# Patient Record
Sex: Female | Born: 1980 | Race: Black or African American | Hispanic: No | Marital: Married | Smoking: Never smoker
Health system: Southern US, Community
[De-identification: ages and names within clinical notes are randomized; demographics above are authoritative.]

## PROBLEM LIST (undated history)

## (undated) DIAGNOSIS — R079 Chest pain, unspecified: Secondary | ICD-10-CM

## (undated) DIAGNOSIS — R7303 Prediabetes: Secondary | ICD-10-CM

## (undated) DIAGNOSIS — F419 Anxiety disorder, unspecified: Secondary | ICD-10-CM

## (undated) DIAGNOSIS — I10 Essential (primary) hypertension: Secondary | ICD-10-CM

## (undated) DIAGNOSIS — F329 Major depressive disorder, single episode, unspecified: Secondary | ICD-10-CM

## (undated) DIAGNOSIS — D649 Anemia, unspecified: Secondary | ICD-10-CM

## (undated) DIAGNOSIS — K219 Gastro-esophageal reflux disease without esophagitis: Secondary | ICD-10-CM

## (undated) DIAGNOSIS — T7840XA Allergy, unspecified, initial encounter: Secondary | ICD-10-CM

## (undated) DIAGNOSIS — G44009 Cluster headache syndrome, unspecified, not intractable: Secondary | ICD-10-CM

## (undated) DIAGNOSIS — E785 Hyperlipidemia, unspecified: Secondary | ICD-10-CM

## (undated) DIAGNOSIS — M549 Dorsalgia, unspecified: Secondary | ICD-10-CM

## (undated) DIAGNOSIS — E282 Polycystic ovarian syndrome: Secondary | ICD-10-CM

## (undated) DIAGNOSIS — F32A Depression, unspecified: Secondary | ICD-10-CM

## (undated) DIAGNOSIS — G4733 Obstructive sleep apnea (adult) (pediatric): Secondary | ICD-10-CM

## (undated) HISTORY — DX: Major depressive disorder, single episode, unspecified: F32.9

## (undated) HISTORY — DX: Allergy, unspecified, initial encounter: T78.40XA

## (undated) HISTORY — PX: WISDOM TOOTH EXTRACTION: SHX21

## (undated) HISTORY — DX: Chest pain, unspecified: R07.9

## (undated) HISTORY — DX: Morbid (severe) obesity due to excess calories: E66.01

## (undated) HISTORY — DX: Essential (primary) hypertension: I10

## (undated) HISTORY — DX: Cluster headache syndrome, unspecified, not intractable: G44.009

## (undated) HISTORY — DX: Hyperlipidemia, unspecified: E78.5

## (undated) HISTORY — DX: Depression, unspecified: F32.A

## (undated) HISTORY — PX: UPPER GI ENDOSCOPY: SHX6162

## (undated) HISTORY — PX: COLONOSCOPY: SHX174

## (undated) HISTORY — DX: Obstructive sleep apnea (adult) (pediatric): G47.33

---
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
## 1998-05-04 ENCOUNTER — Emergency Department (HOSPITAL_COMMUNITY): Admission: EM | Admit: 1998-05-04 | Discharge: 1998-05-04 | Payer: Self-pay | Admitting: Emergency Medicine

## 1998-06-09 ENCOUNTER — Emergency Department (HOSPITAL_COMMUNITY): Admission: EM | Admit: 1998-06-09 | Discharge: 1998-06-09 | Payer: Self-pay | Admitting: Emergency Medicine

## 1998-06-09 ENCOUNTER — Encounter: Payer: Self-pay | Admitting: Emergency Medicine

## 1998-06-10 ENCOUNTER — Emergency Department (HOSPITAL_COMMUNITY): Admission: EM | Admit: 1998-06-10 | Discharge: 1998-06-10 | Payer: Self-pay | Admitting: Emergency Medicine

## 1998-06-10 ENCOUNTER — Encounter: Payer: Self-pay | Admitting: Emergency Medicine

## 1998-09-20 ENCOUNTER — Emergency Department (HOSPITAL_COMMUNITY): Admission: EM | Admit: 1998-09-20 | Discharge: 1998-09-20 | Payer: Self-pay | Admitting: Internal Medicine

## 1998-10-23 ENCOUNTER — Emergency Department (HOSPITAL_COMMUNITY): Admission: EM | Admit: 1998-10-23 | Discharge: 1998-10-23 | Payer: Self-pay | Admitting: Emergency Medicine

## 1999-02-26 ENCOUNTER — Other Ambulatory Visit: Admission: RE | Admit: 1999-02-26 | Discharge: 1999-02-26 | Payer: Self-pay | Admitting: Obstetrics

## 1999-03-06 ENCOUNTER — Encounter: Admission: RE | Admit: 1999-03-06 | Discharge: 1999-03-28 | Payer: Self-pay | Admitting: Family Medicine

## 2000-02-11 ENCOUNTER — Emergency Department (HOSPITAL_COMMUNITY): Admission: EM | Admit: 2000-02-11 | Discharge: 2000-02-11 | Payer: Self-pay | Admitting: Emergency Medicine

## 2000-02-27 ENCOUNTER — Other Ambulatory Visit: Admission: RE | Admit: 2000-02-27 | Discharge: 2000-02-27 | Payer: Self-pay | Admitting: Obstetrics

## 2000-03-28 ENCOUNTER — Emergency Department (HOSPITAL_COMMUNITY): Admission: EM | Admit: 2000-03-28 | Discharge: 2000-03-28 | Payer: Self-pay | Admitting: Emergency Medicine

## 2000-10-01 ENCOUNTER — Emergency Department (HOSPITAL_COMMUNITY): Admission: EM | Admit: 2000-10-01 | Discharge: 2000-10-02 | Payer: Self-pay | Admitting: Emergency Medicine

## 2000-10-07 ENCOUNTER — Encounter: Payer: Self-pay | Admitting: Emergency Medicine

## 2000-10-07 ENCOUNTER — Emergency Department (HOSPITAL_COMMUNITY): Admission: EM | Admit: 2000-10-07 | Discharge: 2000-10-08 | Payer: Self-pay | Admitting: Emergency Medicine

## 2001-02-09 ENCOUNTER — Inpatient Hospital Stay (HOSPITAL_COMMUNITY): Admission: AD | Admit: 2001-02-09 | Discharge: 2001-02-09 | Payer: Self-pay | Admitting: Obstetrics & Gynecology

## 2001-03-28 ENCOUNTER — Emergency Department (HOSPITAL_COMMUNITY): Admission: EM | Admit: 2001-03-28 | Discharge: 2001-03-28 | Payer: Self-pay | Admitting: Emergency Medicine

## 2001-03-30 ENCOUNTER — Emergency Department (HOSPITAL_COMMUNITY): Admission: EM | Admit: 2001-03-30 | Discharge: 2001-03-30 | Payer: Self-pay | Admitting: Emergency Medicine

## 2001-05-24 ENCOUNTER — Inpatient Hospital Stay (HOSPITAL_COMMUNITY): Admission: AD | Admit: 2001-05-24 | Discharge: 2001-05-24 | Payer: Self-pay | Admitting: Obstetrics

## 2001-09-27 ENCOUNTER — Emergency Department (HOSPITAL_COMMUNITY): Admission: EM | Admit: 2001-09-27 | Discharge: 2001-09-27 | Payer: Self-pay | Admitting: Emergency Medicine

## 2001-10-18 ENCOUNTER — Emergency Department (HOSPITAL_COMMUNITY): Admission: EM | Admit: 2001-10-18 | Discharge: 2001-10-18 | Payer: Self-pay | Admitting: Emergency Medicine

## 2001-10-23 ENCOUNTER — Encounter: Admission: RE | Admit: 2001-10-23 | Discharge: 2001-10-23 | Payer: Self-pay | Admitting: Internal Medicine

## 2001-11-24 ENCOUNTER — Emergency Department (HOSPITAL_COMMUNITY): Admission: EM | Admit: 2001-11-24 | Discharge: 2001-11-25 | Payer: Self-pay | Admitting: Emergency Medicine

## 2001-11-25 ENCOUNTER — Encounter: Payer: Self-pay | Admitting: Emergency Medicine

## 2002-02-04 HISTORY — PX: APPENDECTOMY: SHX54

## 2002-05-24 ENCOUNTER — Emergency Department (HOSPITAL_COMMUNITY): Admission: EM | Admit: 2002-05-24 | Discharge: 2002-05-24 | Payer: Self-pay | Admitting: Emergency Medicine

## 2002-10-16 ENCOUNTER — Emergency Department (HOSPITAL_COMMUNITY): Admission: EM | Admit: 2002-10-16 | Discharge: 2002-10-16 | Payer: Self-pay | Admitting: Emergency Medicine

## 2002-10-16 ENCOUNTER — Encounter: Payer: Self-pay | Admitting: Emergency Medicine

## 2002-12-08 ENCOUNTER — Emergency Department (HOSPITAL_COMMUNITY): Admission: EM | Admit: 2002-12-08 | Discharge: 2002-12-08 | Payer: Self-pay | Admitting: Emergency Medicine

## 2002-12-09 ENCOUNTER — Observation Stay (HOSPITAL_COMMUNITY): Admission: EM | Admit: 2002-12-09 | Discharge: 2002-12-10 | Payer: Self-pay | Admitting: Emergency Medicine

## 2002-12-10 ENCOUNTER — Encounter (INDEPENDENT_AMBULATORY_CARE_PROVIDER_SITE_OTHER): Payer: Self-pay | Admitting: Specialist

## 2003-01-20 ENCOUNTER — Emergency Department (HOSPITAL_COMMUNITY): Admission: EM | Admit: 2003-01-20 | Discharge: 2003-01-20 | Payer: Self-pay | Admitting: Emergency Medicine

## 2003-03-29 ENCOUNTER — Other Ambulatory Visit: Admission: RE | Admit: 2003-03-29 | Discharge: 2003-03-29 | Payer: Self-pay | Admitting: *Deleted

## 2003-04-26 ENCOUNTER — Emergency Department (HOSPITAL_COMMUNITY): Admission: EM | Admit: 2003-04-26 | Discharge: 2003-04-26 | Payer: Self-pay | Admitting: Emergency Medicine

## 2003-07-23 ENCOUNTER — Emergency Department (HOSPITAL_COMMUNITY): Admission: EM | Admit: 2003-07-23 | Discharge: 2003-07-23 | Payer: Self-pay | Admitting: Emergency Medicine

## 2003-12-17 ENCOUNTER — Emergency Department (HOSPITAL_COMMUNITY): Admission: EM | Admit: 2003-12-17 | Discharge: 2003-12-17 | Payer: Self-pay | Admitting: Emergency Medicine

## 2004-03-13 ENCOUNTER — Emergency Department (HOSPITAL_COMMUNITY): Admission: EM | Admit: 2004-03-13 | Discharge: 2004-03-13 | Payer: Self-pay | Admitting: *Deleted

## 2004-06-20 ENCOUNTER — Emergency Department (HOSPITAL_COMMUNITY): Admission: EM | Admit: 2004-06-20 | Discharge: 2004-06-20 | Payer: Self-pay | Admitting: Emergency Medicine

## 2004-06-21 ENCOUNTER — Ambulatory Visit (HOSPITAL_COMMUNITY): Admission: RE | Admit: 2004-06-21 | Discharge: 2004-06-21 | Payer: Self-pay | Admitting: Emergency Medicine

## 2004-08-02 ENCOUNTER — Emergency Department (HOSPITAL_COMMUNITY): Admission: EM | Admit: 2004-08-02 | Discharge: 2004-08-02 | Payer: Self-pay | Admitting: Emergency Medicine

## 2004-09-12 ENCOUNTER — Emergency Department (HOSPITAL_COMMUNITY): Admission: EM | Admit: 2004-09-12 | Discharge: 2004-09-13 | Payer: Self-pay | Admitting: Emergency Medicine

## 2004-10-23 ENCOUNTER — Emergency Department (HOSPITAL_COMMUNITY): Admission: EM | Admit: 2004-10-23 | Discharge: 2004-10-23 | Payer: Self-pay | Admitting: Emergency Medicine

## 2005-10-24 ENCOUNTER — Inpatient Hospital Stay (HOSPITAL_COMMUNITY): Admission: AD | Admit: 2005-10-24 | Discharge: 2005-10-24 | Payer: Self-pay | Admitting: Family Medicine

## 2005-11-28 ENCOUNTER — Emergency Department (HOSPITAL_COMMUNITY): Admission: EM | Admit: 2005-11-28 | Discharge: 2005-11-28 | Payer: Self-pay | Admitting: Emergency Medicine

## 2005-12-10 ENCOUNTER — Encounter: Admission: RE | Admit: 2005-12-10 | Discharge: 2006-03-10 | Payer: Self-pay | Admitting: Obstetrics and Gynecology

## 2006-02-02 ENCOUNTER — Emergency Department (HOSPITAL_COMMUNITY): Admission: EM | Admit: 2006-02-02 | Discharge: 2006-02-02 | Payer: Self-pay | Admitting: Emergency Medicine

## 2006-02-10 ENCOUNTER — Encounter: Payer: Self-pay | Admitting: Family

## 2006-06-20 ENCOUNTER — Encounter: Admission: RE | Admit: 2006-06-20 | Discharge: 2006-09-18 | Payer: Self-pay | Admitting: Obstetrics and Gynecology

## 2006-09-12 ENCOUNTER — Encounter: Admission: RE | Admit: 2006-09-12 | Discharge: 2006-09-30 | Payer: Self-pay | Admitting: Obstetrics and Gynecology

## 2006-11-25 ENCOUNTER — Emergency Department (HOSPITAL_COMMUNITY): Admission: EM | Admit: 2006-11-25 | Discharge: 2006-11-25 | Payer: Self-pay | Admitting: Emergency Medicine

## 2007-03-06 ENCOUNTER — Encounter: Admission: RE | Admit: 2007-03-06 | Discharge: 2007-03-06 | Payer: Self-pay | Admitting: Obstetrics and Gynecology

## 2007-03-06 ENCOUNTER — Encounter: Admission: RE | Admit: 2007-03-06 | Discharge: 2007-03-06 | Payer: Self-pay | Admitting: Gastroenterology

## 2007-11-12 ENCOUNTER — Other Ambulatory Visit: Admission: RE | Admit: 2007-11-12 | Discharge: 2007-11-12 | Payer: Self-pay | Admitting: Gynecology

## 2008-01-17 ENCOUNTER — Emergency Department (HOSPITAL_COMMUNITY): Admission: EM | Admit: 2008-01-17 | Discharge: 2008-01-17 | Payer: Self-pay | Admitting: Emergency Medicine

## 2008-02-05 HISTORY — PX: CHOLECYSTECTOMY: SHX55

## 2008-02-10 ENCOUNTER — Emergency Department (HOSPITAL_COMMUNITY): Admission: EM | Admit: 2008-02-10 | Discharge: 2008-02-10 | Payer: Self-pay | Admitting: Emergency Medicine

## 2008-02-12 ENCOUNTER — Encounter: Payer: Self-pay | Admitting: Family

## 2008-02-22 ENCOUNTER — Encounter: Payer: Self-pay | Admitting: Family

## 2008-04-28 ENCOUNTER — Emergency Department (HOSPITAL_COMMUNITY): Admission: EM | Admit: 2008-04-28 | Discharge: 2008-04-28 | Payer: Self-pay | Admitting: Emergency Medicine

## 2008-05-12 ENCOUNTER — Ambulatory Visit: Payer: Self-pay | Admitting: Obstetrics and Gynecology

## 2008-05-12 LAB — CONVERTED CEMR LAB
FSH: 6.6 milliintl units/mL
Hgb A1c MFr Bld: 5.8 % (ref 4.6–6.1)
LH: 10.4 milliintl units/mL
Prolactin: 6.7 ng/mL
Sex Hormone Binding: 34 nmol/L (ref 18–114)
TSH: 1.601 microintl units/mL (ref 0.350–4.500)
Testosterone Free: 11.7 pg/mL — ABNORMAL HIGH (ref 0.6–6.8)
Testosterone-% Free: 1.8 % (ref 0.4–2.4)
Testosterone: 65.88 ng/dL (ref 10–70)

## 2008-05-17 ENCOUNTER — Emergency Department (HOSPITAL_COMMUNITY): Admission: EM | Admit: 2008-05-17 | Discharge: 2008-05-17 | Payer: Self-pay | Admitting: Emergency Medicine

## 2008-05-18 ENCOUNTER — Ambulatory Visit: Payer: Self-pay | Admitting: Physician Assistant

## 2008-05-18 ENCOUNTER — Inpatient Hospital Stay (HOSPITAL_COMMUNITY): Admission: AD | Admit: 2008-05-18 | Discharge: 2008-05-18 | Payer: Self-pay | Admitting: Obstetrics & Gynecology

## 2008-06-02 ENCOUNTER — Ambulatory Visit (HOSPITAL_COMMUNITY): Admission: RE | Admit: 2008-06-02 | Discharge: 2008-06-02 | Payer: Self-pay | Admitting: Gastroenterology

## 2008-06-03 ENCOUNTER — Ambulatory Visit: Payer: Self-pay | Admitting: Obstetrics & Gynecology

## 2008-06-24 ENCOUNTER — Encounter: Payer: Self-pay | Admitting: Gastroenterology

## 2008-08-18 ENCOUNTER — Ambulatory Visit (HOSPITAL_COMMUNITY): Admission: RE | Admit: 2008-08-18 | Discharge: 2008-08-19 | Payer: Self-pay | Admitting: General Surgery

## 2008-08-18 ENCOUNTER — Encounter (INDEPENDENT_AMBULATORY_CARE_PROVIDER_SITE_OTHER): Payer: Self-pay | Admitting: General Surgery

## 2008-11-16 ENCOUNTER — Ambulatory Visit: Payer: Self-pay | Admitting: Obstetrics & Gynecology

## 2009-05-15 ENCOUNTER — Emergency Department (HOSPITAL_COMMUNITY): Admission: EM | Admit: 2009-05-15 | Discharge: 2009-05-15 | Payer: Self-pay | Admitting: Emergency Medicine

## 2009-08-26 ENCOUNTER — Emergency Department (HOSPITAL_COMMUNITY): Admission: EM | Admit: 2009-08-26 | Discharge: 2009-08-26 | Payer: Self-pay | Admitting: Emergency Medicine

## 2009-08-28 ENCOUNTER — Encounter (INDEPENDENT_AMBULATORY_CARE_PROVIDER_SITE_OTHER): Payer: Self-pay | Admitting: *Deleted

## 2009-09-01 ENCOUNTER — Encounter (INDEPENDENT_AMBULATORY_CARE_PROVIDER_SITE_OTHER): Payer: Self-pay | Admitting: *Deleted

## 2009-09-04 ENCOUNTER — Ambulatory Visit: Payer: Self-pay | Admitting: Family

## 2009-09-04 ENCOUNTER — Telehealth: Payer: Self-pay | Admitting: Family

## 2009-09-04 DIAGNOSIS — R5381 Other malaise: Secondary | ICD-10-CM | POA: Insufficient documentation

## 2009-09-04 DIAGNOSIS — E669 Obesity, unspecified: Secondary | ICD-10-CM | POA: Insufficient documentation

## 2009-09-04 DIAGNOSIS — K219 Gastro-esophageal reflux disease without esophagitis: Secondary | ICD-10-CM | POA: Insufficient documentation

## 2009-09-04 DIAGNOSIS — R5383 Other fatigue: Secondary | ICD-10-CM

## 2009-09-04 DIAGNOSIS — F3289 Other specified depressive episodes: Secondary | ICD-10-CM | POA: Insufficient documentation

## 2009-09-04 DIAGNOSIS — F329 Major depressive disorder, single episode, unspecified: Secondary | ICD-10-CM

## 2009-09-04 DIAGNOSIS — E282 Polycystic ovarian syndrome: Secondary | ICD-10-CM | POA: Insufficient documentation

## 2009-09-04 DIAGNOSIS — R0789 Other chest pain: Secondary | ICD-10-CM | POA: Insufficient documentation

## 2009-09-04 DIAGNOSIS — H60399 Other infective otitis externa, unspecified ear: Secondary | ICD-10-CM | POA: Insufficient documentation

## 2009-09-05 ENCOUNTER — Encounter: Payer: Self-pay | Admitting: Family

## 2009-09-05 ENCOUNTER — Ambulatory Visit: Payer: Self-pay | Admitting: Gastroenterology

## 2009-09-05 DIAGNOSIS — K3189 Other diseases of stomach and duodenum: Secondary | ICD-10-CM | POA: Insufficient documentation

## 2009-09-05 DIAGNOSIS — R1013 Epigastric pain: Secondary | ICD-10-CM

## 2009-09-06 LAB — CONVERTED CEMR LAB
ALT: 23 units/L (ref 0–35)
AST: 20 units/L (ref 0–37)
Albumin: 4.1 g/dL (ref 3.5–5.2)
Alkaline Phosphatase: 72 units/L (ref 39–117)
BUN: 14 mg/dL (ref 6–23)
Basophils Absolute: 0 10*3/uL (ref 0.0–0.1)
Basophils Relative: 0.6 % (ref 0.0–3.0)
CO2: 30 meq/L (ref 19–32)
Calcium: 9.4 mg/dL (ref 8.4–10.5)
Chloride: 110 meq/L (ref 96–112)
Creatinine, Ser: 0.8 mg/dL (ref 0.4–1.2)
Eosinophils Absolute: 0.2 10*3/uL (ref 0.0–0.7)
Eosinophils Relative: 2.8 % (ref 0.0–5.0)
GFR calc non Af Amer: 106.13 mL/min (ref >60)
Glucose, Bld: 98 mg/dL (ref 70–99)
HCT: 38.7 % (ref 36.0–46.0)
Hemoglobin: 12.9 g/dL (ref 12.0–15.0)
Lymphocytes Relative: 34.1 % (ref 12.0–46.0)
Lymphs Abs: 2.3 10*3/uL (ref 0.7–4.0)
MCHC: 33.4 g/dL (ref 30.0–36.0)
MCV: 78.5 fL (ref 78.0–100.0)
Monocytes Absolute: 0.4 10*3/uL (ref 0.1–1.0)
Monocytes Relative: 5.3 % (ref 3.0–12.0)
Neutro Abs: 3.8 10*3/uL (ref 1.4–7.7)
Neutrophils Relative %: 57.2 % (ref 43.0–77.0)
Platelets: 366 10*3/uL (ref 150.0–400.0)
Potassium: 4.5 meq/L (ref 3.5–5.1)
RBC: 4.93 M/uL (ref 3.87–5.11)
RDW: 14.4 % (ref 11.5–14.6)
Sodium: 144 meq/L (ref 135–145)
Total Bilirubin: 0.7 mg/dL (ref 0.3–1.2)
Total Protein: 7.3 g/dL (ref 6.0–8.3)
WBC: 6.7 10*3/uL (ref 4.5–10.5)

## 2009-10-03 ENCOUNTER — Encounter: Payer: Self-pay | Admitting: Cardiology

## 2009-10-04 ENCOUNTER — Ambulatory Visit: Payer: Self-pay

## 2009-10-04 ENCOUNTER — Ambulatory Visit: Payer: Self-pay | Admitting: Cardiology

## 2009-10-06 ENCOUNTER — Ambulatory Visit: Payer: Self-pay | Admitting: Family

## 2009-10-31 ENCOUNTER — Encounter: Payer: Self-pay | Admitting: Family

## 2009-11-28 ENCOUNTER — Ambulatory Visit: Payer: Self-pay | Admitting: Family

## 2009-11-28 ENCOUNTER — Telehealth: Payer: Self-pay | Admitting: Family

## 2009-11-28 DIAGNOSIS — H6590 Unspecified nonsuppurative otitis media, unspecified ear: Secondary | ICD-10-CM | POA: Insufficient documentation

## 2010-02-25 ENCOUNTER — Encounter: Payer: Self-pay | Admitting: Obstetrics and Gynecology

## 2010-03-04 LAB — CONVERTED CEMR LAB
Free T4: 1.2 ng/dL (ref 0.80–1.80)
T3, Free: 3 pg/mL (ref 2.3–4.2)
TSH: 3.526 microintl units/mL (ref 0.350–4.500)

## 2010-03-06 NOTE — Letter (Signed)
Summary: [facility]   Imported By: Lanelle Bal 09/15/2009 14:09:48  _____________________________________________________________________  External Attachment:    Type:   Image     Comment:   External Document

## 2010-03-06 NOTE — Miscellaneous (Signed)
  Clinical Lists Changes  Observations: Added new observation of PAST MED HX: Asthma Depression ? Frequents Headaches Allergies High Cholesterol  morbid obesity chest pain. (10/03/2009 19:06) Added new observation of PRIMARY MD: Sandford Craze, FNP (10/03/2009 19:06)       Past History:  Past Medical History: Asthma Depression ? Frequents Headaches Allergies High Cholesterol  morbid obesity chest pain.

## 2010-03-06 NOTE — Assessment & Plan Note (Signed)
Summary: 1 month fu/dt--Rm 5   Vital Signs:  Patient profile:   30 year old female Height:      63 inches Weight:      256.50 pounds BMI:     45.60 Temp:     97.8 degrees F oral Pulse rate:   60 / minute Pulse rhythm:   regular Resp:     16 per minute BP sitting:   118 / 84  (right arm) Cuff size:   large  Vitals Entered By: Mervin Kung CMA Duncan Dull) (October 06, 2009 2:45 PM) CC: Rm 5  1 month follow up.  Has tried Omeprazole but still has abdominal cramping but seems some improved.  Pt would like samples of any of her meds we can give. Comments Pt states she is only taking Prilosec. Never got cortisporin and would like [place] to resend rx. Pt has not picked up Ferrous sulfate. States she never received rxs for lyrica, zoloft, or hctz. Nicki Guadalajara Fergerson CMA Duncan Dull)  October 06, 2009 2:54 PM    Primary Care Kelin Borum:  Sandford Craze, FNP  CC:  Rm 5  1 month follow up.  Has tried Omeprazole but still has abdominal cramping but seems some improved.  Pt would like samples of any of her meds we can give.Marland Kitchen  History of Present Illness: Ms Ghuman is a 30 yearold female who presents today for follow up.    1)Atypical Chest pain- since last visit she underwent an exercise stress test that was normal.  Chest pain is improved.  2)GI discomfort is improved since PPI was added.  She is also trying to avoid foods that aggravate her symptoms such as red sauces and dairy.  3)Fatigue-  Excited about school.   Energy is somewhat better- but not yet where she would like it.  Still not exercising   4)GYN- lmp was in April, scheduled to see GYN. + history of PCOS.  5)Depression- notes that mood has been better since she started school.    Allergies: 1)  ! * Naproxen 2)  ! Ace Inhibitors 3)  ! Zyrtec Allergy  Social History: Occupation: unemployed, no children (but often cares for her sister's son- very involved in his life)-  Animal nutritionist design/advertising Married Never Smoked Alcohol  use-no Regular exercise-yes  Drug Use - no  Review of Systems       see HPI  Physical Exam  General:  Morbidly obese, pleasant AA female, awake, alert and in NAD Head:  Normocephalic and atraumatic without obvious abnormalities. No apparent alopecia or balding. Ears:  L ear with white exudate in canal Neck:  No deformities, masses, or tenderness noted. Lungs:  Normal respiratory effort, chest expands symmetrically. Lungs are clear to auscultation, no crackles or wheezes. Heart:  Normal rate and regular rhythm. S1 and S2 normal without gallop, murmur, click, rub or other extra sounds. Extremities:  trace left pedal edema and trace right pedal edema.     Impression & Recommendations:  Problem # 1:  DYSPEPSIA (ICD-536.8) Assessment Improved Symptoms improved, saw Dr. Christella Hartigan who recommended continuation of PPI  Problem # 2:  OTITIS EXTERNA, LEFT (ICD-380.10) Assessment: Unchanged Never used cortisporin- needs refill Her updated medication list for this problem includes:    Cortisporin 3.5-10000-1 Soln (Neomycin-polymyxin-hc) .Marland KitchenMarland KitchenMarland KitchenMarland Kitchen 4 drops in left ear three times a day x 10 days (not started)  Problem # 3:  DEPRESSION (ICD-311) Assessment: Improved Patient is not taking zoloft.  Symptoms are improved.  Monitor. The following medications were  removed from the medication list:    Zoloft 50 Mg Tabs (Sertraline hcl) .Marland Kitchen... 1/2 at bedtime  Problem # 4:  CHEST PAIN, ATYPICAL (ICD-786.59) Assessment: Improved Negative stress test.  Problem # 5:  POLYCYSTIC OVARIAN DISEASE (ICD-256.4) Assessment: Comment Only Has f/u with GYN for ammenorhea, may need to go back on metformin.   Complete Medication List: 1)  Prilosec 20 Mg Cpdr (Omeprazole) .... One tablet by mouth daily (not started) 2)  Cortisporin 3.5-10000-1 Soln (Neomycin-polymyxin-hc) .... 4 drops in left ear three times a day x 10 days (not started)  Patient Instructions: 1)  Please keep your follow up appointment with GYN.     2)  Try to walk for 15-20 minutes every day.   3)  Follow up in 3 months for a complete physical, sooner if problems or concerns.   Current Allergies (reviewed today): ! * NAPROXEN ! ACE INHIBITORS ! ZYRTEC ALLERGY

## 2010-03-06 NOTE — Letter (Signed)
   [place] [facility] [address] [place], [place]  [address]  [place] Phone: [phone]      September 05, 2009   [facility] [address] [place], [place] [address]  RE:  LAB RESULTS  Dear  Ms. Segoviano,  The following is an interpretation of your most recent lab tests.  Please take note of any instructions provided or changes to medications that have resulted from your lab work.  THYROID STUDIES:  Thyroid studies normal TSH: 3.526       Sincerely Yours,    Lemont Fillers FNP

## 2010-03-06 NOTE — Letter (Signed)
Summary: [facility]   Imported By: Lanelle Bal 09/15/2009 14:10:31  _____________________________________________________________________  External Attachment:    Type:   Image     Comment:   External Document

## 2010-03-06 NOTE — Assessment & Plan Note (Signed)
Summary: new to be est/mhf--Rm 4   Vital Signs:  Patient profile:   30 year old female Height:      63 inches Weight:      261.50 pounds BMI:     46.49 O2 Sat:      100 % on Room air Temp:     97.7 degrees F oral Pulse rate:   78 / minute Pulse rhythm:   regular Resp:     16 per minute BP sitting:   124 / 82  (right arm) Cuff size:   large  Vitals Entered By: Mervin Kung CMA Duncan Dull) (September 04, 2009 10:36 AM)  O2 Flow:  Room air CC: Room 4   New pt to establish primary care. Has been having increasing abdominal and back pain x 3 weeks. Also has chest pressure and shortness of breath x 1 week; no energy. Pain Assessment Patient in pain? yes     Location: right side, back and abdomen pain Intensity: 6 Type: dull, with intermittent sharp pains Onset of pain  3 weeks ago   CC:  Room 4   New pt to establish primary care. Has been having increasing abdominal and back pain x 3 weeks. Also has chest pressure and shortness of breath x 1 week; no energy.Marland Kitchen  History of Present Illness: Diane Patel is a 30 year old female who presents today to establish care.  She told me that she has not had a primary care provider in some time.   Notes that she has seen Dr.  Starleen Arms in the past for her history of nausea.  She was told that it was her gallbladder.   She notes + history of PCOS with chronic pelvic pain.  She has declined hysterectomy.    Today she reports 9 days ago went to [facility] with complaint of abdominal cramping and "burning" after eating- even if she eats something bland.  Feels sore/painful in the center of her back.   + nausea, + headaches,  fatigue.   Feels depressed.  Reports that she stayed home from work last week.  Pain is worse after eating.  +cramping with eating.  She notes that abdominal "burning" has been relieved by TUMS.  LMP was in April, but notes that she has a history of PCOS and abnormal periods.     Preventive Screening-Counseling & Management  Alcohol-Tobacco   Alcohol drinks/day: 0     Smoking Status: never  Caffeine-Diet-Exercise     Caffeine use/day: none     Does Patient Exercise: yes     Type of exercise: aerobics, jogging     Exercise (avg: min/session): 30-60     Times/week: <3  Allergies (verified): 1)  ! * Naproxen  Past History:  Past Medical History: Asthma Depression ? Frequents Headaches Allergies High Cholesterol  Past Surgical History: Appendectomy--2004 Cholecystectomy--2010 Tonsillectomy & Addenoidectomy--1985  Family History: Mother--HTN, diabetes, heart disease, Polycystic ovaries Father-- HTN, diabetes Maternal GM-- ?cancer of stomach, HTN, hypercholesterolemia, diabetes Maternal GF--osteoporosis,  bone cancer, glaucoma Paternal GM--HTN, mental illness Sister-- Diabetes, pancreatitis  Social History: Occupation: unemployed, no children Married Never Smoked Alcohol use-no Regular exercise-yes Smoking Status:  never Caffeine use/day:  none Does Patient Exercise:  yes  Review of Systems       Constitutional: Notes that she develops ome dizziness, feel "out of it" hot at times.  Notes +heat intolerance. ENT:  Denies nasal congestion or sore throat. + ear drainage L>R Resp: rare cough CV:  see HPI. Notes +  chest pressure/heaviness at times- not worsened by activity.  Worsened by laying flat.    GI:  + nausea- no vomitting GU: Denies dysuria Lymphatic: Denies lymphadenopathy Musculoskeletal:  +bilateral elbow pain, back soreness, + hip pain Skin:  Denies Rashes or lesions. Psychiatric: denies history of depression, recently has been having issues with not wanting to socialize.  Stress with school, looking for a job.  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 Neuro: Denies numbness     Physical Exam  General:  Morbidly obese AA female, awake, alert and in NAD Head:  Normocephalic and atraumatic without obvious abnormalities. No apparent alopecia or balding. Eyes:  + exopthalmos.    Ears:  R TM/canal normal.  L TM normal, canal with white exudate. Neck:  No deformities, masses, or tenderness noted. Lungs:  Normal respiratory effort, chest expands symmetrically. Lungs are clear to auscultation, no crackles or wheezes. Heart:  Normal rate and regular rhythm. S1 and S2 normal without gallop, murmur, click, rub or other extra sounds. Abdomen:  mild diffuse tenderness noted epigastric, RUQ and LUQ without rebound or guarding. + BS, non-distended Msk:  +tenderness overlying anterior chest wall and upper back to palpation. Extremities:  No clubbing, cyanosis, edema, or deformity noted with normal full range of motion of all joints.   Neurologic:  alert & oriented X3.   Skin:  Intact without suspicious lesions or rashes Psych:  Oriented X3, normally interactive, good eye contact, and slightly anxious.     Impression & Recommendations:  Problem # 1:  CHEST PAIN, ATYPICAL (ICD-786.59) Assessment New  EKG today  notes sinus brady 57 beats per minute- no acute changes.  He pain is atypical and I suspect GERD is playing a role in her symptoms.  Will plan to start empiric PPI and will refer for stress test.   Pt was instructed to go to the ED if she develops worsening chest pain.  Orders: Misc. Referral (Misc. Ref)  Problem # 2:  FATIGUE (ICD-780.79) Assessment: New Patient had normal CMET, CBC, lipase last week at the [facility] ED.  Records were reviewed.   She does have very prominent eyes.  Will check TFT's to rule out hyperthyroid.   Orders: T-TSH ([id number]) T-T3, Free [phone]) T-Free T4 (23300)  Problem # 3:  OTITIS EXTERNA, LEFT (ICD-380.10) Assessment: New Will treat with cortisporin. Her updated medication list for this problem includes:    Cortisporin 3.5-10000-1 Soln (Neomycin-polymyxin-hc) .Marland KitchenMarland KitchenMarland KitchenMarland Kitchen 4 drops in left ear three times a day x 10 days  Problem # 4:  GERD (ICD-530.81) Assessment: New Suspect that her GI discomfort is due to gerd- trial of prilosec.    She has followed with Dr. Loreta Ave- but feels that it is too expensive at this time for her to follow up with her.  Plan 1 month trial of PPI- if no improvement will consider abdominal ultrasound and referral back to GI. Her updated medication list for this problem includes:    Prilosec 20 Mg Cpdr (Omeprazole) ..... One tablet by mouth daily  Problem # 5:  DEPRESSION (ICD-311) Assessment: New Suspect that patient is depressed- she is currently unemployed which is a source or great stress for her.  Patient declines antidepressant at this time, but will consider if her symptoms do not improve.   Problem # 6:  POLYCYSTIC OVARIAN DISEASE (ICD-256.4) Assessment: New  Per patient history she has + history of PCOS- also with irregular periods and pelvic pain- has previously been recommended for a laparoscopy to evaluate  for endometriosis but declined.  Reports that she has been on metformin in the past.   Will refer to GYN for further evaluatation of her chronic pelvic discomfort/irregular menses and ? hx of PCOS  Orders: Gynecologic Referral (Gyn)  Complete Medication List: 1)  Prilosec 20 Mg Cpdr (Omeprazole) .... One tablet by mouth daily 2)  Cortisporin 3.5-10000-1 Soln (Neomycin-polymyxin-hc) .... 4 drops in left ear three times a day x 10 days  Other Orders: EKG w/ Interpretation (93000)  Patient Instructions: 1)  You will be contacted about your upcoming stress test and GYN referral.  2)  Go to ER if you develop worsening chest pain or shortness of breath. 3)  Please follow up in 1 month. Prescriptions: CORTISPORIN 3.5-10000-1  SOLN (NEOMYCIN-POLYMYXIN-HC) 4 drops in left ear three times a day x 10 days  #1 x 0   Entered and Authorized by:   Lemont Fillers FNP   Signed by:   Lemont Fillers FNP on 09/04/2009   Method used:   Electronically to        [facility] # [phone]* (retail)       [address] [place], [place]  [address]       Ph: [phone]       Fax:  [phone]   RxID:   [id number]    Current Allergies (reviewed today): ! * NAPROXEN   Preventive Care Screening     Pap SMear-- last year? normal  Mervin Kung CMA Duncan Dull)  September 04, 2009 10:59 AM

## 2010-03-06 NOTE — Assessment & Plan Note (Signed)
Summary: sore throat, congestion / tf,cma-- Rm 4   Vital Signs:  Patient profile:   30 year old female Height:      63 inches Weight:      257.25 pounds BMI:     45.73 Temp:     98.0 degrees F oral Pulse rate:   96 / minute Pulse rhythm:   regular Resp:     18 per minute BP sitting:   110 / 84  (right arm) Cuff size:   large  Vitals Entered By: Mervin Kung CMA Duncan Dull) (November 28, 2009 3:31 PM) CC: Rm 4  Pt states she has had sinus drainage x 1 week, facial pressure, bilateral ear pain, sore throat and productive cough. Is Patient Diabetic? No Pain Assessment Patient in pain? no      Comments Pt completed cortisporin. Has tried OTC Dayquil with no relief. Nicki Guadalajara Fergerson CMA Duncan Dull)  November 28, 2009 3:38 PM    Primary Care Provider:  Sandford Craze, FNP  CC:  Rm 4  Pt states she has had sinus drainage x 1 week, facial pressure, bilateral ear pain, and sore throat and productive cough..  History of Present Illness: Diane Patel is a 30 year old female who presents today with complaint of 1 week history of yellow sinus drainage, + Facial pressure. +bilateral ear pain, sore throat,  +cough clear to yellow sputum.  Had subjective temp last night, followed by chills.   Allergies: 1)  ! * Naproxen 2)  ! Ace Inhibitors 3)  ! Zyrtec Allergy  Past History:  Past Medical History: Last updated: 10/03/2009 Asthma Depression ? Frequents Headaches Allergies High Cholesterol  morbid obesity chest pain.  Past Surgical History: Last updated: 09/05/2009 Appendectomy--2004 Cholecystectomy--2010 Tonsillectomy & Addenoidectomy--1985   Review of Systems       see HPI  Physical Exam  General:  Well-developed,well-nourished,in no acute distress; alert,appropriate and cooperative throughout examination Ears:  R TM with mild erythema, no bulging.  L TM dull with yellow tinged fluid noted behind membrane Neck:  No deformities, masses, or tenderness noted. Lungs:  Normal  respiratory effort, chest expands symmetrically. Lungs are clear to auscultation, no crackles or wheezes. Heart:  Normal rate and regular rhythm. S1 and S2 normal without gallop, murmur, click, rub or other extra sounds.   Impression & Recommendations:  Problem # 1:  SINUSITIS (ICD-473.9) Assessment New Will plan to treat with amoxicillin.  Rapid strep is negative. Pt was instructed as noted in patient sign out. Her updated medication list for this problem includes:    Amoxicillin 500 Mg Caps (Amoxicillin) ..... One cap by mouth three times a day x 10 days  Problem # 2:  OTITIS MEDIA, SEROUS, LEFT (ICD-381.4) Assessment: New Amoxicillin as for #1.   Complete Medication List: 1)  Prilosec 20 Mg Cpdr (Omeprazole) .... One tablet by mouth daily (not started) 2)  Amoxicillin 500 Mg Caps (Amoxicillin) .... One cap by mouth three times a day x 10 days  Patient Instructions: 1)  Call if you develop fever over 101, increasing sinus pressure, pain with eye movement, increased facial tenderness of swelling, or if you develop visual changes. Prescriptions: AMOXICILLIN 500 MG CAPS (AMOXICILLIN) one cap by mouth three times a day x 10 days  #30 x 0   Entered and Authorized by:   Lemont Fillers FNP   Signed by:   Lemont Fillers FNP on 11/28/2009   Method used:   [facility] [address] # [phone]* (retail)       [address] [place], [place]  [address]       Ph: [phone]       Fax: [phone]   RxID:   [id number]    Orders Added: 1)  Est. Patient Level III [24401]    Current Allergies (reviewed today): ! * NAPROXEN ! ACE INHIBITORS ! ZYRTEC ALLERGY

## 2010-03-06 NOTE — Letter (Signed)
Summary: [facility]   Imported By: Lanelle Bal 09/15/2009 14:11:13  _____________________________________________________________________  External Attachment:    Type:   Image     Comment:   External Document

## 2010-03-06 NOTE — Letter (Signed)
Summary: New Patient letter  [facility]  [address] [place], [place] [address]   Phone: [phone]  Fax: [phone]       09/01/2009 MRN: [id number]  [facility] [address] [place], [place]  [address]  Dear Ms. Krontz,  Welcome to the Gastroenterology Division at [facility].    You are scheduled to see Dr.  Yancey Flemings  on October 04, 2009 at 9:30am on the 3rd floor at [facility], [address]  We ask that you try to arrive at our office 15 minutes prior to your appointment time to allow for check-in.  We would like you to complete the enclosed self-administered evaluation form prior to your visit and bring it with you on the day of your appointment.  We will review it with you.  Also, please bring a complete list of all your medications or, if you prefer, bring the medication bottles and we will list them.  Please bring your insurance card so that we may make a copy of it.  If your insurance requires a referral to see a specialist, please bring your referral form from your primary care physician.  Co-payments are due at the time of your visit and may be paid by cash, check or credit card.     Your office visit will consist of a consult with your physician (includes a physical exam), any laboratory testing he/she may order, scheduling of any necessary diagnostic testing (e.g. x-ray, ultrasound, CT-scan), and scheduling of a procedure (e.g. Endoscopy, Colonoscopy) if required.  Please allow enough time on your schedule to allow for any/all of these possibilities.    If you cannot keep your appointment, please call [phone] to cancel or reschedule prior to your appointment date.  This allows [place] the opportunity to schedule an appointment for another patient in need of care.  If you do not cancel or reschedule by 5 p.m. the business day prior to your appointment date, you will be charged a $50.00 late cancellation/no-show fee.    Thank you for  choosing [facility] for your medical needs.  We appreciate the opportunity to care for you.  Please visit [place] at our website  to learn more about our practice.                     Sincerely,                                                             The Gastroenterology Division

## 2010-03-06 NOTE — Procedures (Signed)
Summary: EGD/[facility]  EGD/[facility]   Imported By: Sherian Rein 09/11/2009 14:57:23  _____________________________________________________________________  External Attachment:    Type:   Image     Comment:   External Document

## 2010-03-06 NOTE — Assessment & Plan Note (Signed)
History of Present Illness Visit Type: Initial Visit Primary GI MD: Rob Bunting MD Primary Provider: Sandford Craze, FNP Chief Complaint: abdominal pain, nausea, diarrhea History of Present Illness:     very pleasant 30 year old woman who has been having stomach pains, througout most of stomach, chest, neck.  She has had these pains 3-4 weeks, worse last week.  She went to [facility] ER 10 days ago.  She had blood tests, urine test, no xrays and she wsa told these were all normal.  She was told to see a GI doctor.  She used to see Dr. Loreta Ave a year ago, eventually told her GB was a problem and so had surgery.  Was constipated at that time.  Sometimes she would have diarrhea.  She underwent an EGD, and patient believed it was normal.  She had what sounds like a HIDA scan, and this was abnormal.  Eventually she saw a surgeon and had her GB removed last July.  Done at [facility] hospital.    The current pains are constant, worse with eating even simple foods. Eating can cuase burning in chest, epigastrium.  Can have cramping.  She has been nauseas at times.  These symptoms are similar to "GB" symptoms a year ago.  She saw PCP yesterday, explained symptoms and was told to try PPI daily.  Has not filled that yet.           Current Medications (verified): 1)  Prilosec 20 Mg Cpdr (Omeprazole) .... One Tablet By Mouth Daily (Not Started) 2)  Cortisporin 3.5-10000-1  Soln (Neomycin-Polymyxin-Hc) .... 4 Drops in Left Ear Three Times A Day X 10 Days (Not Started)  Allergies (verified): 1)  ! * Naproxen  Past History:  Past Medical History: Asthma Depression ? Frequents Headaches Allergies High Cholesterol  morbid obesity  Past Surgical History: Appendectomy--2004 Cholecystectomy--2010 Tonsillectomy & Addenoidectomy--1985   Family History: Mother--HTN, diabetes, heart disease, Polycystic ovaries Father-- HTN, diabetes Maternal GM-- ?cancer of stomach, HTN, hypercholesterolemia,  diabetes Maternal GF--osteoporosis,  bone cancer, glaucoma Paternal GM--HTN, mental illness Sister-- Diabetes, pancreatitis   Social History: Occupation: unemployed, no children Married Never Smoked Alcohol use-no Regular exercise-yes   Review of Systems       Pertinent positive and negative review of systems were noted in the above HPI and GI specific review of systems.  All other review of systems was otherwise negative.   Vital Signs:  Patient profile:   30 year old female Height:      63 inches Weight:      260.6 pounds BMI:     46.33 Pulse rate:   84 / minute Pulse rhythm:   regular BP sitting:   124 / 80  (left arm) Cuff size:   regular  Vitals Entered By: Harlow Mares CMA Duncan Dull) (September 05, 2009 1:41 PM)  Physical Exam  Additional Exam:  Constitutional: Obese, otherwise generally well appearing Psychiatric: alert and oriented times 3 Eyes: extraocular movements intact Mouth: oropharynx moist, no lesions Neck: supple, no lymphadenopathy Cardiovascular: heart regular rate and rythm Lungs: CTA bilaterally Abdomen: soft, non-tender, non-distended, no obvious ascites, no peritoneal signs, normal bowel sounds Extremities: no lower extremity edema bilaterally Skin: no lesions on visible extremities    Impression & Recommendations:  Problem # 1:  abdominal pain, burning epigastric pain she has had a workup through Dr. Celso Sickle. man one year ago and we will send away for those records. She saw her primary care provider yesterday who recommended a trial of proton  pump inhibitor and I definitely agree with that recommendation. She'll get a repeat set of labs today including a CBC, complete metabolic profile and will return to see me in 4 weeks and sooner if needed. 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  Other Orders: TLB-CBC Platelet - w/Differential (85025-[id number]) TLB-CMP (Comprehensive Metabolic Pnl) ([id number])  Patient Instructions: 1)  Will get records sent  over from Dr. Kenna Gilbert office from last year. 2)  Will get labs today (cbc, cmet). 3)  Take one prilosec 20-30 min before a meal every day. 4)  Return to see Dr. Christella Hartigan in 4 weeks. 5)  The medication list was reviewed and reconciled.  All changed / newly prescribed medications were explained.  A complete medication list was provided to the patient / caregiver.  Appended Document:  received faxed EGD report from Dr. Kenna Gilbert office dated 06/2008: Normal EGD Reviewed HIDA from 2010: low normal GB EF, with reproduction of pain with CCK administration July 2010 lap chole: GB had adhersions, "c/w chronic cholecystitis" per op note

## 2010-03-06 NOTE — Progress Notes (Signed)
Summary: weight loss referral  Phone Note Outgoing Call   Summary of Call: Pls call patient and let her know that I sent ear drops to her pharmacy. Initial call taken by: Lemont Fillers FNP,  September 04, 2009 1:59 PM  Follow-up for Phone Call        Pt notified re: rx to pharmacy. Pt wants to know if you could recommend someone to help her with nutrition and weight loss? Does not want to take meds to help lose weight. Please advise.  Nicki Guadalajara Fergerson CMA Duncan Dull)  September 04, 2009 2:20 PM   Additional Follow-up for Phone Call Additional follow up Details #1::        I will refer her to a nutritionist. Additional Follow-up by: Lemont Fillers FNP,  September 04, 2009 2:24 PM  New Problems: OBESITY (ICD-278.00)   Additional Follow-up for Phone Call Additional follow up Details #2::    Pt notified. Nicki Guadalajara Fergerson CMA Duncan Dull)  September 04, 2009 3:31 PM   New Problems: OBESITY (ICD-278.00)

## 2010-03-06 NOTE — Letter (Signed)
Summary: New Patient letter  [facility]  [address] [place], [place] [address]   Phone: [phone]  Fax: [phone]       08/28/2009 MRN: [id number]  [facility] [address] [place], [place]  [address]  Dear Ms. Legacy,  Welcome to the Gastroenterology Division at [facility].    You are scheduled to see Dr.  Jarold Motto on 10-10-09 at 10:00a.m. on the 3rd floor at [facility], [address]  We ask that you try to arrive at our office 15 minutes prior to your appointment time to allow for check-in.  We would like you to complete the enclosed self-administered evaluation form prior to your visit and bring it with you on the day of your appointment.  We will review it with you.  Also, please bring a complete list of all your medications or, if you prefer, bring the medication bottles and we will list them.  Please bring your insurance card so that we may make a copy of it.  If your insurance requires a referral to see a specialist, please bring your referral form from your primary care physician.  Co-payments are due at the time of your visit and may be paid by cash, check or credit card.     Your office visit will consist of a consult with your physician (includes a physical exam), any laboratory testing he/she may order, scheduling of any necessary diagnostic testing (e.g. x-ray, ultrasound, CT-scan), and scheduling of a procedure (e.g. Endoscopy, Colonoscopy) if required.  Please allow enough time on your schedule to allow for any/all of these possibilities.    If you cannot keep your appointment, please call [phone] to cancel or reschedule prior to your appointment date.  This allows [place] the opportunity to schedule an appointment for another patient in need of care.  If you do not cancel or reschedule by 5 p.m. the business day prior to your appointment date, you will be charged a $50.00 late cancellation/no-show fee.    Thank you for choosing  [facility] for your medical needs.  We appreciate the opportunity to care for you.  Please visit [place] at our website  to learn more about our practice.                     Sincerely,                                                             The Gastroenterology Division

## 2010-03-06 NOTE — Letter (Signed)
Summary: Patient No Show/[facility]  [facility] Show/ [facility]   Imported By: Lanelle Bal 11/09/2009 08:13:12  _____________________________________________________________________  External Attachment:    Type:   Image     Comment:   External Document

## 2010-03-06 NOTE — Progress Notes (Signed)
Summary: Congestion & drainage  Phone Note Call from Patient Call back at Home Phone [phone]   Caller: Patient Reason for Call: Talk to Nurse Summary of Call: Pt states she has drainage that is causing a sore throat, she also congestion which causing her face to hurt, she said she has tried DayQuil but it doesn't help, pls call her Initial call taken by: Lannette Donath,  November 28, 2009 12:07 PM  Follow-up for Phone Call        Left message on machine to return my call. Nicki Guadalajara Fergerson CMA Duncan Dull)  November 28, 2009 1:52 PM   Additional Follow-up for Phone Call Additional follow up Details #1::        Pt returned my call and will come to be seen at 3:30 today. Nicki Guadalajara Fergerson CMA Duncan Dull)  November 28, 2009 3:13 PM

## 2010-03-30 ENCOUNTER — Encounter: Payer: Self-pay | Admitting: Family

## 2010-03-30 ENCOUNTER — Ambulatory Visit (INDEPENDENT_AMBULATORY_CARE_PROVIDER_SITE_OTHER): Payer: Self-pay | Admitting: Family

## 2010-03-30 DIAGNOSIS — L538 Other specified erythematous conditions: Secondary | ICD-10-CM | POA: Insufficient documentation

## 2010-03-30 DIAGNOSIS — L259 Unspecified contact dermatitis, unspecified cause: Secondary | ICD-10-CM

## 2010-03-30 DIAGNOSIS — E669 Obesity, unspecified: Secondary | ICD-10-CM

## 2010-03-30 DIAGNOSIS — F329 Major depressive disorder, single episode, unspecified: Secondary | ICD-10-CM

## 2010-03-30 DIAGNOSIS — F3289 Other specified depressive episodes: Secondary | ICD-10-CM

## 2010-03-30 DIAGNOSIS — H60399 Other infective otitis externa, unspecified ear: Secondary | ICD-10-CM

## 2010-03-30 DIAGNOSIS — N920 Excessive and frequent menstruation with regular cycle: Secondary | ICD-10-CM

## 2010-03-30 LAB — CONVERTED CEMR LAB: Hgb A1c MFr Bld: 5.7 % — ABNORMAL HIGH (ref <5.7)

## 2010-04-02 ENCOUNTER — Encounter: Payer: Self-pay | Admitting: Family

## 2010-04-03 NOTE — Letter (Signed)
Summary: Out of Work  Adult nurse at [employer]. [address]   [place], [place] [address]   Phone: [phone]  Fax: [phone]      March 30, 2010   Employee:  Diane Patel    To Whom It May Concern:   For Medical reasons, please excuse the above named employee from work for the following dates:  Start:   03-30-10    2:15pm  End:   03-30-10  3:25pm  If you need additional information, please feel free to contact our office.         Sincerely,    Mervin Kung CMA (AAMA) Sandford Craze, NP

## 2010-04-04 ENCOUNTER — Telehealth: Payer: Self-pay | Admitting: Family

## 2010-04-12 NOTE — Letter (Signed)
   Diane Patel at [facility] [address] [place], [place]  [address]  [place] Phone: [phone]      April 02, 2010   [name] [address] [place], [place] [address]  RE:  LAB RESULTS  Dear  Diane Patel,  The following is an interpretation of your most recent lab tests.  Please take note of any instructions provided or changes to medications that have resulted from your lab work.   Your diabetic test A1C was upper limits of normal at 5.7.  Not in the diabetic range.  We will keep an eye on this.  Keep up your efforts at healthy eating, diet, exercise and weight loss.  Try to avoid concentrated sweets.  Soda, candy etc.  Substitute whole grain pasta and bread for the white pasta and white bread.  Please follow up in August as scheduled.   Sincerely Yours,    Lemont Fillers FNP  Appended Document:  Mailed.

## 2010-04-12 NOTE — Progress Notes (Signed)
Summary: alternate doctor   Phone Note Outgoing Call   Call placed by: Marj Call placed to: Dr Billy Coast  Summary of Call: Tried to get patient an appt with Dr Billy Coast per your referral order.  His practice does not accept her insurance.  Who would you like to refer to instead?  Initial call taken by: Roselle Locus,  April 04, 2010 9:37 AM  Follow-up for Phone Call        Please try [facility] or [facility]. Follow-up by: Lemont Fillers FNP,  April 04, 2010 12:13 PM  Additional Follow-up for Phone Call Additional follow up Details #1::        thanks Roselle Locus  April 04, 2010 2:26 PM

## 2010-04-12 NOTE — Assessment & Plan Note (Signed)
Summary: left ear heavy drainage and heavy menstrual bleeding/ss--rm 3   Vital Signs:  Patient profile:   29 year old female Height:      63 inches Weight:      272 pounds BMI:     48.36 Temp:     98.0 degrees F oral Pulse rate:   96 / minute Pulse rhythm:   regular Resp:     18 per minute BP sitting:   116 / 78  (right arm) Cuff size:   large  Vitals Entered By: Mervin Kung CMA Duncan Dull) (March 30, 2010 2:30 PM) CC: Pt here with multiple concerns. Has left ear drainage x  several weeks. Is Patient Diabetic? No Comments Pt has completed Prilosec and Amoxicillin. Nicki Guadalajara Fergerson CMA Duncan Dull)  March 30, 2010 2:36 PM    Primary Care Provider:  Sandford Craze, FNP  CC:  Pt here with multiple concerns. Has left ear drainage x  several weeks.Marland Kitchen  History of Present Illness: Diane Patel is a 30 year old female who presents today with several concerns:  1) Ear drainage-  has been told  that she is talking softly.  Draining, itching on the left, itching on the right.  + water in the ear.  Balance is "off a little bit. "Throat is "raspy."   occasional post nasal drip.  Using allegra.   2) Heavy menstural bleeding- started Monday.  Mon through Wednesday was using >6 pads a day.  Yesterday used 4, now improving.  + clots.  was using ibuprofen.  Has not seen GYN.    3) Depression- feels that this is going well.  Recent decrease in life stressors has helped.  4) Obesity- interested in discussing a weight loss program.  5) Dry patch of skin on back of neck  Allergies: 1)  ! * Naproxen 2)  ! Ace Inhibitors 3)  ! Zyrtec Allergy  Past History:  Past Medical History: Last updated: 10/03/2009 Asthma Depression ? Frequents Headaches Allergies High Cholesterol  morbid obesity chest pain.  Past Surgical History: Last updated: 09/05/2009 Appendectomy--2004 Cholecystectomy--2010 Tonsillectomy & Addenoidectomy--1985   Review of Systems       see HPI  Physical  Exam  General:  Well-developed,well-nourished,in no acute distress; alert,appropriate and cooperative throughout examination Head:  Normocephalic and atraumatic without obvious abnormalities. No apparent alopecia or balding. Ears:  TM's without erythema or bulging.  L ear canal is noted to have purulent drainage.   Lungs:  Normal respiratory effort, chest expands symmetrically. Lungs are clear to auscultation, no crackles or wheezes. Heart:  Normal rate and regular rhythm. S1 and S2 normal without gallop, murmur, click, rub or other extra sounds. Skin:  raise hyperpigmented lichenified patch noted on posterior neck.  Acanthosis nigricans. hyperpigmentation beneath breasts   Impression & Recommendations:  Problem # 1:  OTITIS MEDIA, SEROUS, LEFT (ICD-381.4) Assessment New Will treat with cortisporin ear drops.    Problem # 2:  INTERTRIGO, CANDIDAL (PPI-951.88) Assessment: New Will treat with nystatin powder.  Problem # 3:  ECZEMA (ICD-692.9) Assessment: New recommended otc hydrocortisone cream to patch on back of neck  Problem # 4:  OBESITY (ICD-278.00) Patient was counseled on diet, exercise and weight loss.  Wants a structured program.  Recommended weight watchers. 25 minutes spent with the patient today.  Greater than 50% of this time was spent counseling patient on diet, exercise, weight loss and her depression.  Problem # 5:  MENORRHAGIA (ICD-626.2) Assessment: Unchanged Pt is agreeable to GYN referral.  Will arrange.  Orders: Gynecologic Referral (Gyn)  Problem # 6:  DEPRESSION (ICD-311) Assessment: Comment Only Stable, monitor.    Complete Medication List: 1)  Prilosec 20 Mg Cpdr (Omeprazole) .... One tablet by mouth daily (not started) 2)  Ibuprofen 200 Mg Tabs (Ibuprofen) .... 2 tablets 3 times a day. 3)  Nystop 100000 Unit/gm Powd (Nystatin) .... Apply one to two times daily between/under breasts and abdomen. 4)  Cortisporin 3.5-10000-1 Soln (Neomycin-polymyxin-hc)  .... 4 drops into both ears 3 times daily for 10 days or until healed.  Other Orders: T-Hgb A1C ([id number])  Patient Instructions: 1)  You will be contacted about your referral to GYN.   2)  Call if your symptoms worsen, or if they do not improve. 3)  Please schedule a follow-up appointment in 6 months. Prescriptions: CORTISPORIN 3.5-10000-1 SOLN (NEOMYCIN-POLYMYXIN-HC) 4 drops into both ears 3 times daily for 10 days or until healed.  #1 x 2   Entered and Authorized by:   Lemont Fillers FNP   Signed by:   Lemont Fillers FNP on 03/30/2010   Method used:   Electronically to        [facility] # [phone]* (retail)       [address] [place], [place]  [address]       Ph: [phone]       Fax: [phone]   RxID:   [id number] NYSTOP 100000 UNIT/GM POWD (NYSTATIN) apply one to two times daily between/under breasts and abdomen.  #1 x 2   Entered and Authorized by:   Lemont Fillers FNP   Signed by:   Lemont Fillers FNP on 03/30/2010   Method used:   Electronically to        [facility] # [phone]* (retail)       [address] [place], [place]  [address]       Ph: [phone]       Fax: [phone]   RxID:   [id number]    Orders Added: 1)  T-Hgb A1C [[id number]] 2)  Gynecologic Referral [Gyn] 3)  Est. Patient Level IV [[address]]    Current Allergies (reviewed today): ! * NAPROXEN ! ACE INHIBITORS ! ZYRTEC ALLERGY

## 2010-04-21 LAB — DIFFERENTIAL
Eosinophils Absolute: 0.2 10*3/uL (ref 0.0–0.7)
Lymphs Abs: 2 10*3/uL (ref 0.7–4.0)
Monocytes Relative: 7 % (ref 3–12)
Neutro Abs: 3.5 10*3/uL (ref 1.7–7.7)
Neutrophils Relative %: 58 % (ref 43–77)

## 2010-04-21 LAB — URINE CULTURE: Colony Count: NO GROWTH

## 2010-04-21 LAB — CBC
HCT: 38.8 % (ref 36.0–46.0)
Hemoglobin: 13 g/dL (ref 12.0–15.0)
MCH: 26.3 pg (ref 26.0–34.0)
MCHC: 33.5 g/dL (ref 30.0–36.0)
MCV: 78.6 fL (ref 78.0–100.0)
RDW: 14 % (ref 11.5–15.5)

## 2010-04-21 LAB — URINALYSIS, ROUTINE W REFLEX MICROSCOPIC
Bilirubin Urine: NEGATIVE
Nitrite: NEGATIVE
Specific Gravity, Urine: 1.019 (ref 1.005–1.030)
Urobilinogen, UA: 0.2 mg/dL (ref 0.0–1.0)
pH: 7 (ref 5.0–8.0)

## 2010-04-21 LAB — LIPASE, BLOOD: Lipase: 23 U/L (ref 11–59)

## 2010-04-21 LAB — PREGNANCY, URINE: Preg Test, Ur: NEGATIVE

## 2010-04-21 LAB — COMPREHENSIVE METABOLIC PANEL
ALT: 17 U/L (ref 0–35)
BUN: 10 mg/dL (ref 6–23)
CO2: 29 mEq/L (ref 19–32)
Calcium: 9 mg/dL (ref 8.4–10.5)
Creatinine, Ser: 0.82 mg/dL (ref 0.4–1.2)
GFR calc non Af Amer: >60 mL/min (ref >60)
Glucose, Bld: 79 mg/dL (ref 70–99)
Sodium: 137 mEq/L (ref 135–145)
Total Protein: 6.9 g/dL (ref 6.0–8.3)

## 2010-04-25 LAB — GLUCOSE, CAPILLARY: Glucose-Capillary: 96 mg/dL (ref 70–99)

## 2010-05-13 LAB — GLUCOSE, CAPILLARY

## 2010-05-13 LAB — COMPREHENSIVE METABOLIC PANEL
ALT: 19 U/L (ref 0–35)
AST: 23 U/L (ref 0–37)
Albumin: 3.8 g/dL (ref 3.5–5.2)
Calcium: 9.5 mg/dL (ref 8.4–10.5)
Chloride: 107 mEq/L (ref 96–112)
Creatinine, Ser: 0.87 mg/dL (ref 0.4–1.2)
GFR calc Af Amer: >60 mL/min (ref >60)
Sodium: 143 mEq/L (ref 135–145)

## 2010-05-13 LAB — CBC
MCHC: 33.8 g/dL (ref 30.0–36.0)
MCV: 81 fL (ref 78.0–100.0)
Platelets: 359 10*3/uL (ref 150–400)
WBC: 7 10*3/uL (ref 4.0–10.5)

## 2010-05-13 LAB — DIFFERENTIAL
Eosinophils Absolute: 0.1 10*3/uL (ref 0.0–0.7)
Eosinophils Relative: 2 % (ref 0–5)
Lymphocytes Relative: 32 % (ref 12–46)
Lymphs Abs: 2.2 10*3/uL (ref 0.7–4.0)
Monocytes Absolute: 0.4 10*3/uL (ref 0.1–1.0)

## 2010-05-16 LAB — COMPREHENSIVE METABOLIC PANEL
Alkaline Phosphatase: 55 U/L (ref 39–117)
BUN: 8 mg/dL (ref 6–23)
Creatinine, Ser: 0.78 mg/dL (ref 0.4–1.2)
Glucose, Bld: 82 mg/dL (ref 70–99)
Potassium: 3.8 mEq/L (ref 3.5–5.1)
Total Protein: 6.7 g/dL (ref 6.0–8.3)

## 2010-05-16 LAB — CBC
HCT: 40.8 % (ref 36.0–46.0)
Hemoglobin: 13.9 g/dL (ref 12.0–15.0)
MCHC: 34 g/dL (ref 30.0–36.0)
MCV: 80.6 fL (ref 78.0–100.0)
RDW: 13.1 % (ref 11.5–15.5)

## 2010-05-16 LAB — GC/CHLAMYDIA PROBE AMP, GENITAL
Chlamydia, DNA Probe: NEGATIVE
GC Probe Amp, Genital: NEGATIVE

## 2010-05-16 LAB — WET PREP, GENITAL: Yeast Wet Prep HPF POC: NONE SEEN

## 2010-05-16 LAB — GLUCOSE, CAPILLARY: Glucose-Capillary: 83 mg/dL (ref 70–99)

## 2010-05-16 LAB — LIPASE, BLOOD: Lipase: 19 U/L (ref 11–59)

## 2010-05-17 LAB — CBC
MCHC: 33 g/dL (ref 30.0–36.0)
RBC: 4.98 MIL/uL (ref 3.87–5.11)
RDW: 13.7 % (ref 11.5–15.5)

## 2010-05-17 LAB — COMPREHENSIVE METABOLIC PANEL
ALT: 19 U/L (ref 0–35)
AST: 20 U/L (ref 0–37)
Albumin: 3.9 g/dL (ref 3.5–5.2)
Calcium: 9.3 mg/dL (ref 8.4–10.5)
GFR calc Af Amer: >60 mL/min (ref >60)
Sodium: 140 mEq/L (ref 135–145)
Total Protein: 6.9 g/dL (ref 6.0–8.3)

## 2010-05-17 LAB — GC/CHLAMYDIA PROBE AMP, GENITAL
Chlamydia, DNA Probe: NEGATIVE
GC Probe Amp, Genital: NEGATIVE

## 2010-05-17 LAB — WET PREP, GENITAL
Trich, Wet Prep: NONE SEEN
Yeast Wet Prep HPF POC: NONE SEEN

## 2010-05-17 LAB — URINALYSIS, ROUTINE W REFLEX MICROSCOPIC
Bilirubin Urine: NEGATIVE
Ketones, ur: NEGATIVE mg/dL
Nitrite: NEGATIVE
Specific Gravity, Urine: 1.023 (ref 1.005–1.030)
Urobilinogen, UA: 0.2 mg/dL (ref 0.0–1.0)

## 2010-05-17 LAB — PREGNANCY, URINE: Preg Test, Ur: NEGATIVE

## 2010-05-17 LAB — DIFFERENTIAL
Eosinophils Absolute: 0.1 10*3/uL (ref 0.0–0.7)
Eosinophils Relative: 3 % (ref 0–5)
Lymphs Abs: 1.7 10*3/uL (ref 0.7–4.0)
Monocytes Relative: 6 % (ref 3–12)

## 2010-06-19 NOTE — Group Therapy Note (Signed)
NAMEKARN, DERK              ACCOUNT NO.:  [id number]   MEDICAL RECORD NO.:  [id number]          PATIENT TYPE:  WOC   LOCATION:  [facility]                   FACILITY[facility]  [facility]   PHYSICIAN:  Sid Falcon, CNM  DATE OF BIRTH:  [birth year]   DATE OF SERVICE:  06/03/2008                                  CLINIC NOTE   The patient is here status post followup maternity admissions visit on  May 18, 2008.  The patient was seen there for continuous chronic  abdominal pain.  At that visit she was given a GI cocktail which she  states did improve the pain slightly, however, did burn.  She was seen  also here in our clinic on May 12, 2008, at which she was given a GI  referral.  She has since had her first appointment at the GI doctor and  is going for followup labs today.  It is believed that her pain is  possibly of GI etiology and the plan will be developed at this visit  today.  The patient also reports past medical history of polycystic  ovarian syndrome.  Does continue to have intermittent pelvic pain,  increases with sex.  At the GI visit the patient was placed on Kapidex  GI medication, will continue with this.  Also taking metformin for the  polycystic ovarian syndrome.  The patient will follow up with [place] in 2  weeks to review the plan indicated at her GI visit.  Will consider  possible laparoscopic evaluation to rule out endometriosis if pain is  not resolved after GI issues have been addressed.      Sid Falcon, CNM     WM/MEDQ  D:  06/03/2008  T:  06/03/2008  Job:  [id number]

## 2010-06-19 NOTE — Op Note (Signed)
Diane Patel, Diane Patel              ACCOUNT NO.:  [id number]   MEDICAL RECORD NO.:  [id number]          PATIENT TYPE:  OIB   LOCATION:  5127                         FACILITY:  [facility]   PHYSICIAN:  Adolph Pollack, M.D.DATE OF BIRTH:  [birth year]   DATE OF PROCEDURE:  08/18/2008  DATE OF DISCHARGE:                               OPERATIVE REPORT   PREOPERATIVE DIAGNOSIS:  Biliary dyskinesia.   POSTOPERATIVE DIAGNOSIS:  Biliary dyskinesia.   PROCEDURE:  Laparoscopic cholecystectomy.   SURGEON:  Adolph Pollack, MD   ASSISTANT:  Ardeth Sportsman, MD   ANESTHESIA:  General.   INDICATIONS:  This is a 30 year old female has been intermittent  problems with nausea.  She then began to develop some right upper  quadrant discomfort, especially after eating a greasy food.  She has  been seen at multiple emergency departments in town.  She has had an  ultrasound, which demonstrated normal gallbladder.  A hepatobiliary scan  with CCK injection demonstrates a borderline low ejection fraction.  With CCK injection, her symptoms were reproduced.  Upper endoscopy is  unremarkable.  She now presents for cholecystectomy for biliary  dyskinesia.  The procedure, risks, and aftercare were discussed with her  preoperatively.   TECHNIQUE:  She was brought to the operating room, placed supine on the  operating table, and the general anesthetic was administered.  The  abdominal wall was sterilely prepped and draped.  There was a previous  subumbilical incision, and I re-incised this and carried this down to  the subcutaneous tissue to identify the fascia.  A small incision was  made in the anterior and posterior fascia and peritoneum, entering the  peritoneal cavity under direct vision.  A pursestring suture was placed  around the fascial edges.  A Hasson trocar was introduced into the  peritoneal cavity.  A pneumoperitoneum created by insufflation of CO2  gas.   Next, a laparoscope was introduced  and there was no underlying bleeding  or organ injury.  She was then placed in a reverse Trendelenburg  position with a right side tilted slightly up.  An 11-mm trocar was  placed through an epigastric incision and two 5-mm trocars were placed  in the right upper quadrant.  The gallbladder was visualized and was  pale red with omental adhesions.  Grossly, this was consistent with a  chronic cholecystitis picture.  The fundus was grasped and adhesions  were taken down bluntly.  The fundus was then retracted to the right  shoulder.  The infundibulum was then mobilized with dissection on the  gallbladder and retracted laterally.  I identified an anterior branch of  the cystic artery and created a window around this and it was clipped  and divided.  The cystic duct was then identified and created a window  around this and achieved a critical view.  I placed a clip just above  the cystic duct gallbladder junction and made a small incision to the  cystic duct and had efflux of bile.   A cholangiocath was passed through the anterior abdominal wall.  I  attempted multiple times and  thread this into the cystic duct.  The  cystic duct was of short-to-moderate length.  However, I could not get  passed a valve.  Given the fact her liver function tests were normal and  she did not have stones and the anatomy was clear, I decided to abort  the cholangiogram.   The cystic duct was then clipped 3 times on the biliary side and  divided.  The posterior branch of the cystic artery was identified and a  window created around it, it was clipped and divided.  The gallbladder  was dissected free from liver using electrocautery and placed an  Endopouch bag.   I then copiously irrigated out the gallbladder fossa.  Hemostasis was  adequate.  There was no bile leak.  Irrigation fluid was evacuated as  much as possible.   The gallbladder was then removed in the bag through the subumbilical  port, and the  subumbilical fascial defect was closed under laparoscopic  vision by tightening up and tying down the pursestring suture.  The  remaining trocars were removed and then pneumoperitoneum was released.  Skin incisions were closed with 4-0 Monocryl subcuticular stitches  followed by Steri-Strips and sterile dressings.  She tolerated the  procedure well without any apparent complications and was taken to  recovery room in satisfactory condition.      Adolph Pollack, M.D.  Electronically Signed     TJR/MEDQ  D:  08/18/2008  T:  08/18/2008  Job:  [id number]

## 2010-06-19 NOTE — Group Therapy Note (Signed)
NAMEKALIS, FRIESE NO.:  [id number]   MEDICAL RECORD NO.:  [id number]          PATIENT TYPE:  WOC   LOCATION:  [facility]                   FACILITY:  [facility]   PHYSICIAN:  Argentina Donovan, MD        DATE OF BIRTH:  [birth year]   DATE OF SERVICE:  05/12/2008                                  CLINIC NOTE   CHIEF COMPLAINT:  Referral from [facility] for a history of  PCOS and abdominal/pelvic pain.   HISTORY:  The patient is a 30 year old obese female with a history of  polycystic ovarian syndrome.  The patient reports a history of  abdominal/pelvic pain for eight years.  She has pain and difficulty  walking and nausea.  The patient reports the pain is bilateral in  nature, underneath her ribs and radiating downwards to her hips.  She  has also had no menstrual cycles since January 2009.  She has had a  weight gain of approximately 80 to 90 pounds over this same time period.  She has a family history of some sort of GI complaint in her  grandmother.  Her mother had tumors in her uterus.  The patient has  had several providers in the past.  There was no clear etiology found.  Denies any other positive history.   The patient had presented to [facility] on 04/28/2008, for  evaluation of her pelvic pain.  At that time she was found to have a  bacterial vaginosis on wet prep with negative GC and Chlamydia.  The  patient also had a transabdominal intravaginal ultrasound which did not  show any abnormalities.  The patient reports her last Pap smear was in  January 2010, and was normal.  She denies any history of abnormal Pap  smears.   PHYSICAL EXAMINATION:  VITAL SIGNS:  Temperature 98.3 degrees, pulse 89,  blood pressure 153/70, weight 280.9 pounds, height 5 feet 3 inches.  GENERAL:  The patient is a morbidly obese female, in no acute distress.  HEENT/NECK:  The patient does not appear hirsute.  She has some  acanthosis nigricans of the posterior neck.   No thyromegaly.  No  exophthalmos.  RESPIRATORY:  Clear to auscultation bilaterally.  CARDIOVASCULAR:  Regular rate and rhythm.  No murmurs, rubs or gallops.  ABDOMEN:  Obese, soft.  Positive bowel sounds.  Diffuse abdominal  tenderness with palpation.  No rebound, no guarding.  GENITOURINARY/PELVIC:  Pain with entry of speculum into the vaginal  vault.  Thin white discharge.  Cervix appears normal to visualization.  BIMANUAL EXAM:  On bimanual exam no abnormalities noted.  Some pain with  the exam; however, no cervical motion tenderness or adnexal tenderness  specifically.   ASSESSMENT/PLAN:  1. Pelvic/abdominal pain:  Unclear etiology, unlikely pelvic etiology,      given the patient's description of pain.  Possibly secondary to      cystic disease; however not visualized on ultrasound.  May be GI in      nature and discussed possible need for a GI referral to the      patient.  Will defer  further workup at this time.  2. Oligomenorrhea:  The patient has had no menstrual cycles for grater      than one year, possibly secondary to polycystic ovarian syndrome,      although the patient is not hirsute and no cysts visualized on      ultrasound.  Also possibly secondary to morbid obesity and excess      estrogens.  Will check TSH, SH, LA, prolactin, free testosterone,      post-prandial __________, post-prandial glucose as well as a      hemoglobin A1c.  Will start the patient on Metformin 500 mg p.o.      b.i.d. and have her follow up in three weeks to discuss the      results.  Of note, the patient has been desirous of children and      has been unable to conceive.  3. Bacterial vaginosis:  She never obtained Flagyl as prescribed by      the emergency room physician, secondary to perceived cost issue.      The patient has been trying to get in with the Health Department      for medication assistance.  I explained to the patient that Flagyl      is actually on the 4L plan at [facility]  and therefore encouraged her      to obtain it.  If initially treated and the patient continues to      have painful intercourse, would refer further.     ______________________________  Lequita Asal, M.D.    ______________________________  Argentina Donovan, MD    TB/MEDQ  D:  05/12/2008  T:  05/12/2008  Job:  [id number]

## 2010-06-22 NOTE — H&P (Signed)
Diane Patel, GAUGER                          ACCOUNT NO.:  [id number]   MEDICAL RECORD NO.:  [id number]                   PATIENT TYPE:  OBV   LOCATION:  0102                                 FACILITY:  [facility]   PHYSICIAN:  Currie Paris, M.D.           DATE OF BIRTH:  Jun 08, 1980   DATE OF ADMISSION:  12/09/2002  DATE OF DISCHARGE:                                HISTORY & PHYSICAL   CHIEF COMPLAINT:  Abdominal pain.   HISTORY OF PRESENT ILLNESS:  This is a 30 year old student who developed mid  abdominal pain and then right lower quadrant pain yesterday and was  apparently seen in the emergency room, felt to have a viral syndrome and  discharged but came back today because of increasing worsening pain, which  is very constant but not made better or worse by anything that she could  find although being still seemed to help.  She has vomited four times and  has had no appetite.  She had a little bit of diarrhea yesterday.  She rated  the pain as about a 10/10.  Because of her symptoms, she as noted came back  to the ER.   PAST MEDICAL HISTORY:  OPERATIONS:  She apparently had an umbilical hernia  repaired at age 32.   MEDICATIONS:  She occasionally takes albuterol for asthma and takes birth  control pills.   ALLERGIES:  No known drug allergies.   SOCIAL HISTORY:  The patient is a Consulting civil engineer.  Does not smoke nor drink.   FAMILY HISTORY:  Patient's father died with multiple medical problems  including some form of cancer and diabetes.  Mother has had diabetes and  several other family members have various kinds of cancers.   REVIEW OF SYSTEMS:  HEENT:  Negative.  CHEST:  History of asthma.  No cough,  shortness of breath recently.  No current symptoms.  HEART:  No history of  chest pain, hypertension, or other cardiac symptoms.  ABDOMEN:  Negative  except for HPI.  GU:  Regular menstrual periods.  No irregularities or other  problems.  No kidney problems known.   EXTREMITIES:  Negative.   PHYSICAL EXAMINATION:  Patient is a generally alert and oriented but  uncomfortable appearing female.  VITAL SIGNS:  Temperature 97, pulse 97, blood pressure 133/93.  HEENT:  Head is normocephalic.  Eyes, pupils equal, round and regular.  EOMs  are intact, nonicteric.  Pharynx does not appear dry.  Mucous membranes  okay.  Dentition good.  NECK:  Supple, there are no masses or thyromegaly.  LUNGS:  Clear to auscultation today.  I do not really hear any wheezes or  rhonchi.  HEART:  Regular rhythm.  No murmurs, rubs, or gallops are heard.  ABDOMEN:  Well-healed umbilical scar.  The abdomen is soft on the left side  but she has guarding in the right side with marked right lower quadrant  tenderness with  some rebound and referred rebound to the right lower  quadrant.  Bowel sounds are present but diminished.  There are no hernias  noted.  There is no hepatosplenomegaly or masses noted.  PELVIC/RECTAL:  Not done.  EXTREMITIES:  She has no cyanosis or edema.  Full range of motion.  Pulses  are intact carotid, femoral, and dorsalis pedis.   DATA REVIEWED:  Her white count per verbal report it was 15,000, I have not  found a written report, again it was 6,000 yesterday.  Urinalysis was  negative.  BMET was negative.   CT scan was reviewed with the radiologist and is consistent with  appendicitis.   IMPRESSION:  Acute appendicitis.   PLAN:  Will proceed to appendectomy.  Will try to do this laparoscopically  although I told her we might have to do it open.  She has already been given  some IV fluids and antibiotics by the EDP.  Will try to get her done as soon  as the OR is available.  Risks and complications, etcetera have been  discussed with the patient.                                               Currie Paris, M.D.    CJS/MEDQ  D:  12/09/2002  T:  12/09/2002  Job:  [id number]

## 2010-06-22 NOTE — Op Note (Signed)
Diane Patel, Diane Patel                          ACCOUNT NO.:  [id number]   MEDICAL RECORD NO.:  [id number]                   PATIENT TYPE:  OBV   LOCATION:  0444                                 FACILITY:  [facility]   PHYSICIAN:  Currie Paris, M.D.           DATE OF BIRTH:  [birth year]   DATE OF PROCEDURE:  12/09/2002  DATE OF DISCHARGE:                                 OPERATIVE REPORT   PREOPERATIVE DIAGNOSIS:  Acute appendicitis.   POSTOPERATIVE DIAGNOSIS:  Acute appendicitis.   OPERATION/PROCEDURE:  Laparoscopic appendectomy.   SURGEON:  Currie Paris, M.D.   ANESTHESIA:  General endotracheal anesthesia.   INDICATIONS:  This is a 30 year old with signs and symptoms of acute  appendicitis and a CT scan confirming the diagnosis.   DESCRIPTION OF PROCEDURE:  The patient was taken to the operating room and  after satisfactory general anesthesia had been obtained, the abdomen was  prepped and draped and a Foley catheter placed.  0.25% Marcaine was used for  each incision.  Umbilical incision was made and fascia opened and the  peritoneal cavity entered under direct vision.  A pursestring was placed,  the Hasson introduced and held with the pursestring and the abdomen  insufflated to 15.  A 5 mm trocar was placed in the right upper quadrant and  a 10/11 in left lower quadrant, both under direct vision.  The camera was  placed in the left lower quadrant.  The appendix was noted to be lying  medially and the tip of it was stuck to the terminal ileum and it was curled  in just a little bit retroperitoneal at the base of the appendix.  With a  little bit of held dissection, I was able to free the appendix off of the  small bowel mesentery and then in a grasper using Harmonic scalpel, divide  the mesentery down to the base of the appendix and likewise free up where it  was a little bit retroperitoneal.  Once I had this nicely moved up and  visualized, I used the endo-GI to  divide the appendix directly at its base.  The appendix was placed in a EndoCatch bag and brought out the umbilical  port site and the umbilicus port was then replaced.  There was cloudy, foul-  smelling fluid around it and we cultured this.  There was really not a large  collection of fluid within the pelvis or in the abdomen.   With the camera back in, we checked the base of the appendix. There was no  bleeding along the mesentery and the closure appeared intact.  Small bowel  was fine where we had mobilized it. There were no other inflammatory  processes.  No remarkable abnormality of tubes and ovaries on either side.  There was some fluid in the cul-de-sac which was irrigated and suctioned  out.  After this final check, I removed the 5 mm trocar  and there was no  bleeding, followed by the left lower quadrant 10/11 and again no bleeding.  The umbilical port was removed and the abdomen deflated through this and the  pursestring tied down to close the fascia here.  Skin was closed with 4-0  Monocryl subcuticular and Dermabond.  The patient tolerated the procedure  well.  There were no operative complications.  All counts were correct.                                              Currie Paris, M.D.   CJS/MEDQ  D:  12/09/2002  T:  12/09/2002  Job:  [id number]

## 2010-07-12 ENCOUNTER — Encounter: Payer: Self-pay | Admitting: Family

## 2010-07-13 ENCOUNTER — Encounter: Payer: Self-pay | Admitting: Family

## 2010-07-13 ENCOUNTER — Ambulatory Visit (INDEPENDENT_AMBULATORY_CARE_PROVIDER_SITE_OTHER): Payer: PRIVATE HEALTH INSURANCE | Admitting: Family

## 2010-07-13 DIAGNOSIS — J329 Chronic sinusitis, unspecified: Secondary | ICD-10-CM

## 2010-07-13 DIAGNOSIS — J45909 Unspecified asthma, uncomplicated: Secondary | ICD-10-CM | POA: Insufficient documentation

## 2010-07-13 MED ORDER — LORATADINE 10 MG PO TBDP: 10.0000 mg | ORAL_TABLET | Freq: Every day | ORAL | Status: DC

## 2010-07-13 MED ORDER — BUDESONIDE-FORMOTEROL FUMARATE 80-4.5 MCG/ACT IN AERO: 2.0000 | INHALATION_SPRAY | Freq: Two times a day (BID) | RESPIRATORY_TRACT | Status: DC

## 2010-07-13 MED ORDER — ALBUTEROL SULFATE HFA 108 (90 BASE) MCG/ACT IN AERS: 2.0000 | INHALATION_SPRAY | Freq: Four times a day (QID) | RESPIRATORY_TRACT | Status: DC | PRN

## 2010-07-13 MED ORDER — AMOXICILLIN 500 MG PO CAPS: 1000.0000 mg | ORAL_CAPSULE | Freq: Three times a day (TID) | ORAL | Status: AC

## 2010-07-13 NOTE — Assessment & Plan Note (Signed)
New.  Will plan to treat with amoxicillin.  I suspect that the sinus drainage is the cause for her voice hoarseness and worsening asthma symptoms.

## 2010-07-13 NOTE — Patient Instructions (Signed)
Call if your symptoms worsen or do not improve.

## 2010-07-13 NOTE — Assessment & Plan Note (Signed)
Will add symbicort and PRN albuterol.  She may be able to come off of symbicort in a few weeks after her sinusitis/allergy symptoms are under better control.  Claritin was added to her regimen today.

## 2010-07-13 NOTE — Progress Notes (Signed)
Subjective:    Patient ID: Diane Patel, female    DOB: [birth year], 30 y.o.   MRN: [id number]  HPI  Diane Patel is a 30 year old female who presents today with chief complaint of cough. Cough is productive at times.  Cough started 3 weeks ago and is + associated nasal congestion- improved with alkaselzer sinus, wheezing, and fever (last week- subjective only).  She reports some tongue lesions. She continues to look for work. Thankfully her husband is working.  PCOS/Menorrhagia- she was started on Metformin by GYN- Dr. Marcelle Overlie.  Also was started on progesterone.     Review of Systems  See HPI  Past Medical History  Diagnosis Date  . Asthma   . Depression   . Headaches, cluster     frequent  . Allergy   . Hyperlipidemia   . Obesity, morbid     History   Social History  . Marital Status: Married    Spouse Name: N/A    Number of Children: 0  . Years of Education: N/A   Occupational History  . unemployed    Social History Main Topics  . Smoking status: Never Smoker   . Smokeless tobacco: Not on file  . Alcohol Use: No  . Drug Use: No  . Sexually Active:    Other Topics Concern  . Not on file   Social History Narrative   Regular excercise    Past Surgical History  Procedure Date  . Appendectomy 2004  . Cholecystectomy 2010  . Tonsillectomy and adenoidectomy 1985    Family History  Problem Relation Age of Onset  . Hypertension Mother   . Diabetes Mother   . Heart disease Mother   . Polycystic ovary syndrome Mother   . Diabetes Father   . Hypertension Father   . Diabetes Sister   . Pancreatitis Sister   . Cancer Maternal Grandmother     ? stomach cancer  . Diabetes Maternal Grandmother   . Cancer Maternal Grandfather     bone  . Glaucoma Maternal Grandfather   . Osteoporosis Maternal Grandfather   . Hypertension Paternal Grandfather   . Mental illness Paternal Grandfather     Allergies  Allergen Reactions  . Ace Inhibitors     REACTION:  angioedema; lips, throat, tounge swelling  . Cetirizine Hcl   . Naproxen     REACTION: itching    Current Outpatient Prescriptions on File Prior to Visit  Medication Sig Dispense Refill  . ibuprofen (ADVIL,MOTRIN) 200 MG tablet Take 200 mg by mouth every 6 (six) hours as needed.        . NEOMYCIN-POLYMYXIN-HC, OTIC, (CORTISPORIN) 1 % SOLN 4 drops every 6 (six) hours.        Marland Kitchen omeprazole (PRILOSEC) 20 MG capsule Take 20 mg by mouth daily.        Marland Kitchen DISCONTD: nystatin (MYCOSTATIN) powder Apply one to two times daily between/under breasts and abdomen         BP 122/80  Pulse 90  Temp(Src) 98.4 F (36.9 C) (Oral)  Resp 16  Ht 5' 2.99" (1.6 m)  Wt 271 lb 1.9 oz (122.979 kg)  BMI 48.04 kg/m2  SpO2 100%        Objective:   Physical Exam    Gen: morbidly obese, pleasant, AA female awake, alert and in NAD Head: [place]/AT, + frontal sinus tenderness with palpation. Mouth: some cobblestoning on posterior tongue, no significant erythema is noted Ears: Bilateral TM's normal without bulging or  erythema CV: s1/s2, RRR, no murmur is noted Resp: BS CTA bilaterally without W/R/R     Assessment & Plan:  [id number] 2013 07  symbicort sample Proair sample [id number] 04 2013

## 2010-09-28 ENCOUNTER — Ambulatory Visit: Payer: PRIVATE HEALTH INSURANCE | Admitting: Family

## 2010-09-28 DIAGNOSIS — Z029 Encounter for administrative examinations, unspecified: Secondary | ICD-10-CM

## 2010-11-08 LAB — COMPREHENSIVE METABOLIC PANEL
AST: 20 U/L (ref 0–37)
Albumin: 4 g/dL (ref 3.5–5.2)
Calcium: 9.3 mg/dL (ref 8.4–10.5)
Creatinine, Ser: 0.79 mg/dL (ref 0.4–1.2)
GFR calc Af Amer: >60 mL/min (ref >60)
Sodium: 140 mEq/L (ref 135–145)
Total Protein: 6.8 g/dL (ref 6.0–8.3)

## 2010-11-08 LAB — CBC
MCHC: 32.6 g/dL (ref 30.0–36.0)
MCV: 81.1 fL (ref 78.0–100.0)
Platelets: 334 10*3/uL (ref 150–400)

## 2010-11-08 LAB — URINALYSIS, ROUTINE W REFLEX MICROSCOPIC
Bilirubin Urine: NEGATIVE
Glucose, UA: NEGATIVE mg/dL
Hgb urine dipstick: NEGATIVE
Ketones, ur: NEGATIVE mg/dL
pH: 7 (ref 5.0–8.0)

## 2010-11-08 LAB — DIFFERENTIAL
Eosinophils Relative: 1 % (ref 0–5)
Lymphocytes Relative: 17 % (ref 12–46)
Lymphs Abs: 1.3 10*3/uL (ref 0.7–4.0)
Monocytes Absolute: 0.4 10*3/uL (ref 0.1–1.0)
Monocytes Relative: 5 % (ref 3–12)

## 2010-11-08 LAB — POCT I-STAT, CHEM 8
Creatinine, Ser: 0.9 mg/dL (ref 0.4–1.2)
Hemoglobin: 14.3 g/dL (ref 12.0–15.0)
Sodium: 143 mEq/L (ref 135–145)
TCO2: 24 mmol/L (ref 0–100)

## 2010-11-08 LAB — POCT CARDIAC MARKERS
CKMB, poc: <1 ng/mL — ABNORMAL LOW (ref 1.0–8.0)
Myoglobin, poc: 50.8 ng/mL (ref 12–200)
Myoglobin, poc: 53 ng/mL (ref 12–200)

## 2011-02-08 ENCOUNTER — Telehealth: Payer: Self-pay | Admitting: *Deleted

## 2011-02-08 ENCOUNTER — Encounter: Payer: Self-pay | Admitting: Family

## 2011-02-08 ENCOUNTER — Ambulatory Visit (INDEPENDENT_AMBULATORY_CARE_PROVIDER_SITE_OTHER): Payer: Managed Care, Other (non HMO) | Admitting: Family

## 2011-02-08 ENCOUNTER — Ambulatory Visit (HOSPITAL_BASED_OUTPATIENT_CLINIC_OR_DEPARTMENT_OTHER)
Admission: RE | Admit: 2011-02-08 | Discharge: 2011-02-08 | Disposition: A | Discharge status: Home / Self Care | Payer: Managed Care, Other (non HMO) | Source: Ambulatory Visit | Attending: Family | Admitting: Family

## 2011-02-08 ENCOUNTER — Telehealth: Payer: Self-pay | Admitting: Family

## 2011-02-08 DIAGNOSIS — R112 Nausea with vomiting, unspecified: Secondary | ICD-10-CM

## 2011-02-08 DIAGNOSIS — R110 Nausea: Secondary | ICD-10-CM

## 2011-02-08 DIAGNOSIS — Z0189 Encounter for other specified special examinations: Secondary | ICD-10-CM

## 2011-02-08 DIAGNOSIS — R1013 Epigastric pain: Secondary | ICD-10-CM | POA: Insufficient documentation

## 2011-02-08 DIAGNOSIS — R109 Unspecified abdominal pain: Secondary | ICD-10-CM

## 2011-02-08 DIAGNOSIS — R1110 Vomiting, unspecified: Secondary | ICD-10-CM

## 2011-02-08 LAB — BASIC METABOLIC PANEL WITH GFR
BUN: 8 mg/dL (ref 6–23)
Chloride: 105 mEq/L (ref 96–112)
Glucose, Bld: 76 mg/dL (ref 70–99)
Potassium: 4.4 mEq/L (ref 3.5–5.3)
Sodium: 141 mEq/L (ref 135–145)

## 2011-02-08 LAB — CBC WITH DIFFERENTIAL/PLATELET
Basophils Absolute: 0 10*3/uL (ref 0.0–0.1)
Basophils Relative: 0 % (ref 0–1)
Eosinophils Absolute: 0.1 10*3/uL (ref 0.0–0.7)
Eosinophils Relative: 2 % (ref 0–5)
HCT: 41.7 % (ref 36.0–46.0)
MCH: 26.1 pg (ref 26.0–34.0)
MCHC: 32.6 g/dL (ref 30.0–36.0)
MCV: 80 fL (ref 78.0–100.0)
Monocytes Absolute: 0.4 10*3/uL (ref 0.1–1.0)
RBC: 5.21 MIL/uL — ABNORMAL HIGH (ref 3.87–5.11)
RDW: 13.6 % (ref 11.5–15.5)

## 2011-02-08 LAB — HEPATIC FUNCTION PANEL
ALT: 16 U/L (ref 0–35)
AST: 15 U/L (ref 0–37)
Alkaline Phosphatase: 64 U/L (ref 39–117)
Indirect Bilirubin: 0.6 mg/dL (ref 0.0–0.9)
Total Protein: 7 g/dL (ref 6.0–8.3)

## 2011-02-08 MED ORDER — ONDANSETRON HCL 4 MG PO TABS: 4.0000 mg | ORAL_TABLET | Freq: Three times a day (TID) | ORAL | Status: AC | PRN

## 2011-02-08 NOTE — Telephone Encounter (Signed)
Attempted to reach pt and left detailed message on voicemail and to call if any question.

## 2011-02-08 NOTE — Assessment & Plan Note (Signed)
KUB obtained and is neg for obstruction.  Suspect viral etiology.  Obtain labs as below to assess white count, lft's lipase.  She is s/p cholecystectomy, but pancreatitis remains in the differential.  She is instructed to go to ED if symptoms worsen over weekend. Pt verbalizes understanding.

## 2011-02-08 NOTE — Progress Notes (Signed)
Addended by: Mervin Kung A on: 02/08/2011 03:15 PM   Modules accepted: Orders

## 2011-02-08 NOTE — Telephone Encounter (Signed)
Received call from [facility] at [place] stating labs are final. No critical values. They will fax results.

## 2011-02-08 NOTE — Patient Instructions (Signed)
Please complete lab work prior to leaving today. Complete your x-ray on the first floor today. Go to ER if worsening abdominal pain, or if you are unable to keep down fluids/food/medicine.  Follow up in 1 week.

## 2011-02-08 NOTE — Telephone Encounter (Signed)
Please call pt at [phone] and let her know that her abdominal x ray is normal. No obstruction.  I suspect that her symptoms are due to virus.  Blood work is still pending.

## 2011-02-08 NOTE — Progress Notes (Signed)
Subjective:    Patient ID: Diane Patel, female    DOB: 04/30/1980, 31 y.o.   MRN: [id number]  HPI  Diane Patel is a 31 yr old female who presents with chief complaint of nausea. She reports that symptom started like a cold, tried some otc sinus preps.  Noted poor appetite on New Years.  Wednesday she couldn't keep anything down.  Now having dry heaves.  Yesterday she tolerated some gingerale and soup.  Has sensation that something is "stuck".  She is s/p cholecystectomy.  She has had some sweats/chills but denies known fever.  + associated "stabbing pains in my stomach" upper stomach.  She has missed some work from this illness.  She notes that several people in her church had a stomach virus.  She had a BM last night.   Review of Systems See HPI  Past Medical History  Diagnosis Date  . Asthma   . Depression   . Headaches, cluster     frequent  . Allergy   . Hyperlipidemia   . Obesity, morbid     History   Social History  . Marital Status: Married    Spouse Name: N/A    Number of Children: 0  . Years of Education: N/A   Occupational History  . unemployed    Social History Main Topics  . Smoking status: Never Smoker   . Smokeless tobacco: Not on file  . Alcohol Use: No  . Drug Use: No  . Sexually Active:    Other Topics Concern  . Not on file   Social History Narrative   Regular excercise    Past Surgical History  Procedure Date  . Appendectomy 2004  . Cholecystectomy 2010  . Tonsillectomy and adenoidectomy 1985    Family History  Problem Relation Age of Onset  . Hypertension Mother   . Diabetes Mother   . Heart disease Mother   . Polycystic ovary syndrome Mother   . Diabetes Father   . Hypertension Father   . Diabetes Sister   . Pancreatitis Sister   . Cancer Maternal Grandmother     ? stomach cancer  . Diabetes Maternal Grandmother   . Cancer Maternal Grandfather     bone  . Glaucoma Maternal Grandfather   . Osteoporosis Maternal Grandfather     . Hypertension Paternal Grandfather   . Mental illness Paternal Grandfather     Allergies  Allergen Reactions  . Ace Inhibitors     REACTION: angioedema; lips, throat, tounge swelling  . Cetirizine Hcl   . Naproxen     REACTION: itching    Current Outpatient Prescriptions on File Prior to Visit  Medication Sig Dispense Refill  . albuterol (PROVENTIL HFA;VENTOLIN HFA) 108 (90 BASE) MCG/ACT inhaler Inhale 2 puffs into the lungs every 6 (six) hours as needed for wheezing.  1 Inhaler  0  . NEOMYCIN-POLYMYXIN-HC, OTIC, (CORTISPORIN) 1 % SOLN 4 drops every 6 (six) hours.        . budesonide-formoterol (SYMBICORT) 80-4.5 MCG/ACT inhaler Inhale 2 puffs into the lungs 2 (two) times daily.  2 Inhaler  0  . metFORMIN (GLUCOPHAGE) 500 MG tablet Take 500 mg by mouth 2 (two) times daily with a meal.        . omeprazole (PRILOSEC) 20 MG capsule Take 20 mg by mouth daily.        . progesterone (PROMETRIUM) 200 MG capsule Take 200 mg by mouth daily.          BP  106/70  Pulse 85  Temp(Src) 98.1 F (36.7 C) (Oral)  Resp 16  Wt 279 lb (126.554 kg)  SpO2 98%  LMP 01/04/2011       Objective:   Physical Exam  Constitutional: She appears well-developed and well-nourished. No distress.  Cardiovascular: Normal rate and regular rhythm.   No murmur heard. Pulmonary/Chest: Effort normal and breath sounds normal. No respiratory distress. She has no wheezes. She has no rales. She exhibits no tenderness.  Abdominal:       Mild abd. Distension. Hypoactive bowel sounds + epigastric tenderness without guarding.   Psychiatric: She has a normal mood and affect. Her speech is normal and behavior is normal.          Assessment & Plan:

## 2011-02-10 ENCOUNTER — Telehealth: Payer: Self-pay | Admitting: Family

## 2011-02-10 NOTE — Telephone Encounter (Signed)
Please call pt and let her know that all of her lab work is normal.  How is she feeling?

## 2011-02-11 ENCOUNTER — Telehealth: Payer: Self-pay | Admitting: *Deleted

## 2011-02-11 NOTE — Telephone Encounter (Signed)
Office Message from Date: 02/08/2011 12:00:00 AM Time of Call: [phone] Faxed To: [place] CallerSelena Batten Fax Number: [phone] Facility: [facility] Patient: Diane Patel, Diane Patel DOB: Jan 25, 1981 Phone: [phone] Provider: Message: Selena Batten is calling from [place] regarding a BMP ordered on Diane Patel by [place]. The results were . Regarding Appointment: Appt Date: Appt Time: Unknown Provider: Reason: Details: Outcome: Message Taken by: Mercer Pod, CSR [facility]  [address] [place], [place] [address]  P: [phone]  F: [phone]

## 2011-02-11 NOTE — Telephone Encounter (Signed)
See previous phone note.  

## 2011-02-11 NOTE — Telephone Encounter (Signed)
Left message with pt's husband re: results and to have pt return my call.    O'SULLIVAN,MELISSA S., NP 02/10/2011 7:47 PM Signed  Please call pt and let her know that all of her lab work is normal. How is she feeling?

## 2011-02-12 NOTE — Telephone Encounter (Signed)
Received message from pt requesting a work note for 02/08/11 be emailed to her.  Left message on voicemail for her to return my call and provide fax number that letter may be sent to.

## 2011-02-14 MED ORDER — RANITIDINE HCL 150 MG PO CAPS: 150.0000 mg | ORAL_CAPSULE | Freq: Two times a day (BID) | ORAL | Status: DC

## 2011-02-14 NOTE — Telephone Encounter (Signed)
pls let pt know that I will refer to gi. I will send to [place] unless she has someone else in mind. pls let helen know if she has someone else she prefers. Also, in place of prilosec she can start generic zantac. rx sent pharm.

## 2011-02-14 NOTE — Telephone Encounter (Signed)
Left detailed message on pt's cell voicemail and to call if any questions. Advised pt to call if she does not hear back about her referral by middle of next week.

## 2011-02-14 NOTE — Telephone Encounter (Signed)
Pt notified of labs, still having nausea. Has stopped sugars, breads and most foods as they all seem to burn her stomach.  Drinking mainly liquids and they are burning her stomach too. Pt reports that she saw a GI doctor a couple of years ago when she had her gallbladder removed and is willing to see another one if you think it is appropriate. Pt states she hasn't been taking Omeprazole because it causes nausea as well. Did not pick up Zofran due to finances. Please advise.

## 2011-02-15 ENCOUNTER — Ambulatory Visit: Payer: Managed Care, Other (non HMO) | Admitting: Family

## 2011-02-15 DIAGNOSIS — Z0289 Encounter for other administrative examinations: Secondary | ICD-10-CM

## 2011-02-18 ENCOUNTER — Emergency Department (HOSPITAL_BASED_OUTPATIENT_CLINIC_OR_DEPARTMENT_OTHER)
Admission: EM | Admit: 2011-02-18 | Discharge: 2011-02-19 | Disposition: A | Discharge status: Home / Self Care | Payer: Managed Care, Other (non HMO) | Attending: Emergency Medicine | Admitting: Emergency Medicine

## 2011-02-18 ENCOUNTER — Emergency Department (INDEPENDENT_AMBULATORY_CARE_PROVIDER_SITE_OTHER): Payer: Managed Care, Other (non HMO)

## 2011-02-18 ENCOUNTER — Encounter (HOSPITAL_BASED_OUTPATIENT_CLINIC_OR_DEPARTMENT_OTHER): Payer: Self-pay | Admitting: Emergency Medicine

## 2011-02-18 DIAGNOSIS — R05 Cough: Secondary | ICD-10-CM

## 2011-02-18 DIAGNOSIS — E785 Hyperlipidemia, unspecified: Secondary | ICD-10-CM | POA: Insufficient documentation

## 2011-02-18 DIAGNOSIS — R1110 Vomiting, unspecified: Secondary | ICD-10-CM | POA: Insufficient documentation

## 2011-02-18 DIAGNOSIS — J45909 Unspecified asthma, uncomplicated: Secondary | ICD-10-CM | POA: Insufficient documentation

## 2011-02-18 DIAGNOSIS — J40 Bronchitis, not specified as acute or chronic: Secondary | ICD-10-CM | POA: Insufficient documentation

## 2011-02-18 DIAGNOSIS — R059 Cough, unspecified: Secondary | ICD-10-CM | POA: Insufficient documentation

## 2011-02-18 DIAGNOSIS — Z79899 Other long term (current) drug therapy: Secondary | ICD-10-CM | POA: Insufficient documentation

## 2011-02-18 LAB — URINALYSIS, ROUTINE W REFLEX MICROSCOPIC
Bilirubin Urine: NEGATIVE
Ketones, ur: >80 mg/dL — AB
Leukocytes, UA: NEGATIVE
Nitrite: NEGATIVE
Protein, ur: NEGATIVE mg/dL
Urobilinogen, UA: 1 mg/dL (ref 0.0–1.0)
pH: 6 (ref 5.0–8.0)

## 2011-02-18 LAB — COMPREHENSIVE METABOLIC PANEL
Alkaline Phosphatase: 61 U/L (ref 39–117)
BUN: 5 mg/dL — ABNORMAL LOW (ref 6–23)
CO2: 21 mEq/L (ref 19–32)
GFR calc Af Amer: >90 mL/min (ref >90)
GFR calc non Af Amer: 84 mL/min — ABNORMAL LOW (ref >90)
Glucose, Bld: 93 mg/dL (ref 70–99)
Potassium: 3.8 mEq/L (ref 3.5–5.1)
Total Bilirubin: 0.5 mg/dL (ref 0.3–1.2)
Total Protein: 7.4 g/dL (ref 6.0–8.3)

## 2011-02-18 LAB — CBC
Hemoglobin: 12.7 g/dL (ref 12.0–15.0)
MCH: 25.7 pg — ABNORMAL LOW (ref 26.0–34.0)
MCV: 77.8 fL — ABNORMAL LOW (ref 78.0–100.0)
RBC: 4.95 MIL/uL (ref 3.87–5.11)

## 2011-02-18 LAB — DIFFERENTIAL
Eosinophils Absolute: 0.1 10*3/uL (ref 0.0–0.7)
Lymphocytes Relative: 7 % — ABNORMAL LOW (ref 12–46)
Lymphs Abs: 0.6 10*3/uL — ABNORMAL LOW (ref 0.7–4.0)
Monocytes Relative: 5 % (ref 3–12)
Neutrophils Relative %: 87 % — ABNORMAL HIGH (ref 43–77)

## 2011-02-18 MED ORDER — DIPHENHYDRAMINE HCL 50 MG/ML IJ SOLN
25.0000 mg | Freq: Once | INTRAMUSCULAR | Status: AC
  Administered 2011-02-18: 25 mg via INTRAVENOUS
  Filled 2011-02-18: qty 1

## 2011-02-18 MED ORDER — AZITHROMYCIN 250 MG PO TABS: 250.0000 mg | ORAL_TABLET | Freq: Every day | ORAL | Status: AC

## 2011-02-18 MED ORDER — SODIUM CHLORIDE 0.9 % IV SOLN
Freq: Once | INTRAVENOUS | Status: AC
  Administered 2011-02-18: 22:00:00 via INTRAVENOUS

## 2011-02-18 MED ORDER — PROMETHAZINE HCL 25 MG PO TABS: 25.0000 mg | ORAL_TABLET | Freq: Four times a day (QID) | ORAL | Status: AC | PRN

## 2011-02-18 MED ORDER — METOCLOPRAMIDE HCL 5 MG/ML IJ SOLN
10.0000 mg | Freq: Once | INTRAMUSCULAR | Status: AC
  Administered 2011-02-18: 10 mg via INTRAVENOUS
  Filled 2011-02-18: qty 2

## 2011-02-18 MED ORDER — ONDANSETRON 4 MG PO TBDP
4.0000 mg | ORAL_TABLET | Freq: Once | ORAL | Status: AC
  Administered 2011-02-18: 4 mg via ORAL
  Filled 2011-02-18: qty 1

## 2011-02-18 MED ORDER — ACETAMINOPHEN 325 MG PO TABS
650.0000 mg | ORAL_TABLET | Freq: Once | ORAL | Status: AC
  Administered 2011-02-18: 650 mg via ORAL
  Filled 2011-02-18: qty 2

## 2011-02-18 NOTE — ED Notes (Signed)
Pt refused tylenol at this time, stating, "I don't think I can keep it down"

## 2011-02-18 NOTE — ED Notes (Signed)
Pt ambulated to restroom with assist, NAD noted.

## 2011-02-18 NOTE — ED Notes (Signed)
Patient transported to X-ray 

## 2011-02-18 NOTE — ED Notes (Signed)
Pt with vomiting and diarrhea, cough and fever

## 2011-02-18 NOTE — ED Provider Notes (Signed)
History     CSN: [id number]  Arrival date & time 02/18/11  1846   First MD Initiated Contact with Patient 02/18/11 2107      Chief Complaint  Patient presents with  . Fever  . Cough  . Emesis    (Consider location/radiation/quality/duration/timing/severity/associated sxs/prior treatment) Patient is a 31 y.o. female presenting with cough. The history is provided by the patient. No language interpreter was used.  Cough This is a new problem. The problem occurs constantly. The problem has been gradually worsening. The cough is non-productive. The maximum temperature recorded prior to her arrival was 102 to 102.9 F. The fever has been present for 1 to 2 days. Associated symptoms include chest pain, headaches and myalgias. The treatment provided no relief. She is not a smoker. Her past medical history does not include bronchitis or pneumonia.  Pt reports she had a cough for a week.  Pt had nausea.  Pt reports her Md advised her to take prilosec.  Pt reports she was better until yesterday.  Pt reports increased coughing,  Vomiting, diarrhea and headache.  Pt reports no relief with medications at home  Past Medical History  Diagnosis Date  . Asthma   . Depression   . Headaches, cluster     frequent  . Allergy   . Hyperlipidemia   . Obesity, morbid     Past Surgical History  Procedure Date  . Appendectomy 2004  . Cholecystectomy 2010  . Tonsillectomy and adenoidectomy 1985    Family History  Problem Relation Age of Onset  . Hypertension Mother   . Diabetes Mother   . Heart disease Mother   . Polycystic ovary syndrome Mother   . Diabetes Father   . Hypertension Father   . Diabetes Sister   . Pancreatitis Sister   . Cancer Maternal Grandmother     ? stomach cancer  . Diabetes Maternal Grandmother   . Cancer Maternal Grandfather     bone  . Glaucoma Maternal Grandfather   . Osteoporosis Maternal Grandfather   . Hypertension Paternal Grandfather   . Mental illness  Paternal Grandfather     History  Substance Use Topics  . Smoking status: Never Smoker   . Smokeless tobacco: Not on file  . Alcohol Use: No    OB History    Grav Para Term Preterm Abortions TAB SAB Ect Mult Living                  Review of Systems  Respiratory: Positive for cough.   Cardiovascular: Positive for chest pain.  Musculoskeletal: Positive for myalgias.  Neurological: Positive for headaches.  All other systems reviewed and are negative.    Allergies  Ace inhibitors; Cetirizine hcl; Naproxen; and Pineapple  Home Medications   Current Outpatient Rx  Name Route Sig Dispense Refill  . ALBUTEROL SULFATE HFA 108 (90 BASE) MCG/ACT IN AERS Inhalation Inhale 2 puffs into the lungs every 6 (six) hours as needed for wheezing. 1 Inhaler 0  . BUDESONIDE-FORMOTEROL FUMARATE 80-4.5 MCG/ACT IN AERO Inhalation Inhale 2 puffs into the lungs 2 (two) times daily. 2 Inhaler 0  . METFORMIN HCL 500 MG PO TABS Oral Take 500 mg by mouth 2 (two) times daily with a meal.      . NEOMYCIN-POLYMYXIN-HC 1 % OT SOLN  4 drops every 6 (six) hours.      Marland Kitchen PROGESTERONE MICRONIZED 200 MG PO CAPS Oral Take 200 mg by mouth daily.      Marland Kitchen  RANITIDINE HCL 150 MG PO CAPS Oral Take 1 capsule (150 mg total) by mouth 2 (two) times daily. 60 capsule 1    BP 130/71  Pulse 102  Temp(Src) 101.2 F (38.4 C) (Oral)  Resp 18  SpO2 95%  LMP 01/04/2011  Physical Exam  Nursing note and vitals reviewed. Constitutional: She is oriented to person, place, and time. She appears well-developed.  HENT:  Head: Normocephalic and atraumatic.  Right Ear: External ear normal.  Left Ear: External ear normal.  Nose: Nose normal.  Mouth/Throat: Oropharynx is clear and moist.  Eyes: Conjunctivae and EOM are normal. Pupils are equal, round, and reactive to light.  Neck: Normal range of motion. Neck supple.  Cardiovascular: Normal rate and normal heart sounds.   Pulmonary/Chest: Effort normal and breath sounds normal.    Abdominal: Soft. Bowel sounds are normal.  Neurological: She is alert and oriented to person, place, and time.  Skin: Skin is warm.  Psychiatric: She has a normal mood and affect.    ED Course  Procedures (including critical care time)  Labs Reviewed  CBC - Abnormal; Notable for the following:    MCV 77.8 (*)    MCH 25.7 (*)    All other components within normal limits  DIFFERENTIAL - Abnormal; Notable for the following:    Neutrophils Relative 87 (*)    Lymphocytes Relative 7 (*)    Lymphs Abs 0.6 (*)    All other components within normal limits  COMPREHENSIVE METABOLIC PANEL - Abnormal; Notable for the following:    BUN 5 (*)    GFR calc non Af Amer 84 (*)    All other components within normal limits  URINALYSIS, ROUTINE W REFLEX MICROSCOPIC - Abnormal; Notable for the following:    APPearance CLOUDY (*)    Ketones, ur >80 (*)    All other components within normal limits  PREGNANCY, URINE   Dg Chest 2 View  02/18/2011  *RADIOLOGY REPORT*  Clinical Data: Cough.  CHEST - 2 VIEW  Comparison: 08/18/2008.  Findings: The cardiac silhouette, mediastinal and hilar contours are within normal limits and stable.  The lungs are clear.  Mild bronchitic changes may suggest bronchitis.  No infiltrates or effusions.  The bony thorax is intact.  IMPRESSION: Mild bronchitic changes may suggest bronchitis.  Original Report Authenticated By: P. Loralie Champagne, M.D.     No diagnosis found.    MDM  Pt given IV fluids x 2 liters,  Pt given reglan and benadryl.  Pt reports decreased headache,  Temp decreased with tylenol        Langston Masker, [place] 02/18/11 2339

## 2011-02-19 ENCOUNTER — Ambulatory Visit (INDEPENDENT_AMBULATORY_CARE_PROVIDER_SITE_OTHER): Payer: Managed Care, Other (non HMO) | Admitting: Family

## 2011-02-19 ENCOUNTER — Encounter: Payer: Self-pay | Admitting: Family

## 2011-02-19 VITALS — BP 110/80 | HR 105 | Temp 99.5°F | Resp 20

## 2011-02-19 DIAGNOSIS — R509 Fever, unspecified: Secondary | ICD-10-CM

## 2011-02-19 DIAGNOSIS — H6691 Otitis media, unspecified, right ear: Secondary | ICD-10-CM

## 2011-02-19 DIAGNOSIS — J111 Influenza due to unidentified influenza virus with other respiratory manifestations: Secondary | ICD-10-CM | POA: Insufficient documentation

## 2011-02-19 DIAGNOSIS — H6690 Otitis media, unspecified, unspecified ear: Secondary | ICD-10-CM

## 2011-02-19 DIAGNOSIS — J40 Bronchitis, not specified as acute or chronic: Secondary | ICD-10-CM

## 2011-02-19 MED ORDER — OSELTAMIVIR PHOSPHATE 75 MG PO CAPS: 75.0000 mg | ORAL_CAPSULE | Freq: Two times a day (BID) | ORAL | Status: DC

## 2011-02-19 MED ORDER — BENZONATATE 100 MG PO CAPS: 100.0000 mg | ORAL_CAPSULE | Freq: Three times a day (TID) | ORAL | Status: AC | PRN

## 2011-02-19 MED ORDER — OSELTAMIVIR PHOSPHATE 75 MG PO CAPS: 75.0000 mg | ORAL_CAPSULE | Freq: Two times a day (BID) | ORAL | Status: AC

## 2011-02-19 NOTE — Progress Notes (Signed)
Subjective:    Patient ID: Diane Patel, female    DOB: [birth year], 31 y.o.   MRN: [id number]  HPI  Ms.  Diane Patel is a 31 yr old female who presents today with chief complaint of cough.  Symptoms started on Sunday night 1/13 with sore throat.  Went to work on Monday- had trouble talking on the phone due to sore throat.  Later that day  she developed cough  with vomitting and sweating.   She reports associated headache, abdominal discomfort, bilateral facial pain and an episode of blood tinged sputum this morning.  She was seen in the ED yesterday.  Chest x-ray noted changes consistent with bronchitis. She was given IV hydration and a prescription for zithromax.   Tmax was reportedly 103.   She took tylenol this AM at 8:15AM.  She is tolerating liquids, but has not eaten any solids since Sunday.     Review of Systems See HPI  Past Medical History  Diagnosis Date  . Asthma   . Depression   . Headaches, cluster     frequent  . Allergy   . Hyperlipidemia   . Obesity, morbid     History   Social History  . Marital Status: Married    Spouse Name: N/A    Number of Children: 0  . Years of Education: N/A   Occupational History  . unemployed    Social History Main Topics  . Smoking status: Never Smoker   . Smokeless tobacco: Not on file  . Alcohol Use: No  . Drug Use: No  . Sexually Active:    Other Topics Concern  . Not on file   Social History Narrative   Regular excercise    Past Surgical History  Procedure Date  . Appendectomy 2004  . Cholecystectomy 2010  . Tonsillectomy and adenoidectomy 1985    Family History  Problem Relation Age of Onset  . Hypertension Mother   . Diabetes Mother   . Heart disease Mother   . Polycystic ovary syndrome Mother   . Diabetes Father   . Hypertension Father   . Diabetes Sister   . Pancreatitis Sister   . Cancer Maternal Grandmother     ? stomach cancer  . Diabetes Maternal Grandmother   . Cancer Maternal Grandfather     bone   . Glaucoma Maternal Grandfather   . Osteoporosis Maternal Grandfather   . Hypertension Paternal Grandfather   . Mental illness Paternal Grandfather     Allergies  Allergen Reactions  . Ace Inhibitors     REACTION: angioedema; lips, throat, tounge swelling  . Cetirizine Hcl   . Naproxen Itching  . Pineapple Hives    Current Outpatient Prescriptions on File Prior to Visit  Medication Sig Dispense Refill  . albuterol (PROVENTIL HFA;VENTOLIN HFA) 108 (90 BASE) MCG/ACT inhaler Inhale 2 puffs into the lungs every 6 (six) hours as needed for wheezing.  1 Inhaler  0  . budesonide-formoterol (SYMBICORT) 80-4.5 MCG/ACT inhaler Inhale 2 puffs into the lungs 2 (two) times daily.  2 Inhaler  0  . metFORMIN (GLUCOPHAGE) 500 MG tablet Take 500 mg by mouth 2 (two) times daily with a meal.        . NEOMYCIN-POLYMYXIN-HC, OTIC, (CORTISPORIN) 1 % SOLN 4 drops every 6 (six) hours.        . progesterone (PROMETRIUM) 200 MG capsule Take 200 mg by mouth daily.        . promethazine (PHENERGAN) 25 MG tablet Take 1  tablet (25 mg total) by mouth every 6 (six) hours as needed for nausea.  10 tablet  0  . ranitidine (ZANTAC) 150 MG capsule Take 1 capsule (150 mg total) by mouth 2 (two) times daily.  60 capsule  1  . azithromycin (ZITHROMAX) 250 MG tablet Take 1 tablet (250 mg total) by mouth daily. Take first 2 tablets together, then 1 every day until finished.  6 tablet  0   Current Facility-Administered Medications on File Prior to Visit  Medication Dose Route Frequency Provider Last Rate Last Dose  . 0.9 %  sodium chloride infusion   Intravenous Once Langston Masker, [place]      . acetaminophen (TYLENOL) tablet 650 mg  650 mg Oral Once Cyndra Numbers, MD   650 mg at 02/18/11 2048  . diphenhydrAMINE (BENADRYL) injection 25 mg  25 mg Intravenous Once [place], [place]   25 mg at 02/18/11 2207  . metoCLOPramide (REGLAN) injection 10 mg  10 mg Intravenous Once [place], [place]   10 mg at 02/18/11 2212  . ondansetron  (ZOFRAN-ODT) disintegrating tablet 4 mg  4 mg Oral Once Cyndra Numbers, MD   4 mg at 02/18/11 2031    BP 110/80  Pulse 105  Temp(Src) 99.5 F (37.5 C) (Oral)  Resp 20  LMP 01/04/2011       Objective:   Physical Exam  Constitutional:       Ill appearing female seated on exam table, wearing several layers of clothes and hood.   HENT:  Left Ear: Tympanic membrane and ear canal normal.  Mouth/Throat: No oropharyngeal exudate, posterior oropharyngeal edema or posterior oropharyngeal erythema.       R TM is pink, mild bulging.   Eyes:       PERRLA  Neck: Neck supple.  Cardiovascular: Normal rate and regular rhythm.   No murmur heard. Pulmonary/Chest: Effort normal and breath sounds normal. No respiratory distress. She has no wheezes. She has no rales. She exhibits no tenderness.  Abdominal: Soft. She exhibits no distension and no mass. There is no tenderness. There is no rebound and no guarding.  Skin:       Warm to touch  Psychiatric: She has a normal mood and affect. Her behavior is normal. Judgment and thought content normal.          Assessment & Plan:

## 2011-02-19 NOTE — Assessment & Plan Note (Addendum)
She should continue Zithromax.  Husband presents me a picture on his phone of the hemoptysis- it reveals a small amount of blood tinged sputum. Lungs are clear on exam and no pneumonia noted on yesterday's chest xray.  ED notes are reviewed.

## 2011-02-19 NOTE — Patient Instructions (Signed)
Continue Zithromax. Drink at least 8 glasses of water a day the next few days. Call or go to the ER if you are unable to keep down liquid, or if you are unable to keep your fever less than 101 with tylenol, if worsening cough, bloody phlegm, worsening abdominal pain.

## 2011-02-19 NOTE — Assessment & Plan Note (Signed)
Continue zithromax

## 2011-02-19 NOTE — Assessment & Plan Note (Signed)
Rapid flu swab today is positive for Flu. She did not have a flu shot. She is just within the 48 hour window so I will have her start Tamiflu ASAP. Recommended vigorous oral hydration.  She is instructed to go to the ED if symptoms worsen.

## 2011-02-20 NOTE — ED Provider Notes (Signed)
Medical screening examination/treatment/procedure(s) were performed by non-physician practitioner and as supervising physician I was immediately available for consultation/collaboration.  Cyndra Numbers, MD 02/20/11 [phone]

## 2011-02-22 ENCOUNTER — Ambulatory Visit (INDEPENDENT_AMBULATORY_CARE_PROVIDER_SITE_OTHER): Payer: Managed Care, Other (non HMO) | Admitting: Gastroenterology

## 2011-02-22 ENCOUNTER — Encounter: Payer: Self-pay | Admitting: Gastroenterology

## 2011-02-22 VITALS — BP 138/76 | HR 92 | Ht 63.0 in | Wt 269.0 lb

## 2011-02-22 DIAGNOSIS — R110 Nausea: Secondary | ICD-10-CM

## 2011-02-22 NOTE — Progress Notes (Signed)
TEXT FROM 09/2009 NOTE: very pleasant 31 year old woman who has been having stomach pains, througout most of stomach, chest, neck. She has had these pains 3-4 weeks, worse last week. She went to [facility] ER 10 days ago. She had blood tests, urine test, no xrays and she wsa told these were all normal. She was told to see a GI doctor. She used to see Dr. Loreta Ave a year ago, eventually told her GB was a problem and so had surgery. Was constipated at that time. Sometimes she would have diarrhea. She underwent an EGD, and patient believed it was normal.  She had what sounds like a HIDA scan, and this was abnormal. Eventually she saw a surgeon and had her GB removed last July. Done at [facility] hospital.  The current pains are constant, worse with eating even simple foods. Eating can cuase burning in chest, epigastrium. Can have cramping. She has been nauseas at times. These symptoms are similar to "GB" symptoms a year ago.  She saw PCP yesterday, explained symptoms and was told to try PPI daily. Has not filled that yet.    Dr. Kenna Gilbert office dated 06/2008: Normal EGD  Reviewed HIDA from 2010: low normal GB EF, with reproduction of pain with CCK administration  July 2010 lap chole: GB had adhersions, "c/w chronic cholecystitis" per op note   HPI: This is a  31 year old woman who is here with her husband today.  I last saw her about 2 years ago.  She was recommended to try once daily PPI and return to see me 4 weeks later. She did not return to the office but a note from her PCP about a month later states that her GI symptoms signficantly improved after PPI trial.  Three days ago Ifluenza testing was POSITIVE.  "Ihaven't had a checkup since my GB was removed 3 years ago."  The nausea is worse now.  Especially at night.  Maricela Curet has been going on for years.  KUB 2 weeks ago was normal.  She stopped taking her PPI that was really helping her in the past.  Rare caffeine.  + eats meals pretty late at  night.   Review of systems: Pertinent positive and negative review of systems were noted in the above HPI section. Complete review of systems was performed and was otherwise normal.    Past Medical History  Diagnosis Date  . Asthma   . Depression   . Headaches, cluster     frequent  . Allergy   . Hyperlipidemia   . Obesity, morbid     Past Surgical History  Procedure Date  . Appendectomy 2004  . Cholecystectomy 2010  . Tonsillectomy and adenoidectomy 1985    Current Outpatient Prescriptions  Medication Sig Dispense Refill  . albuterol (PROVENTIL HFA;VENTOLIN HFA) 108 (90 BASE) MCG/ACT inhaler Inhale 2 puffs into the lungs every 6 (six) hours as needed for wheezing.  1 Inhaler  0  . azithromycin (ZITHROMAX) 250 MG tablet Take 1 tablet (250 mg total) by mouth daily. Take first 2 tablets together, then 1 every day until finished.  6 tablet  0  . benzonatate (TESSALON) 100 MG capsule Take 1 capsule (100 mg total) by mouth 3 (three) times daily as needed for cough.  20 capsule  0  . budesonide-formoterol (SYMBICORT) 80-4.5 MCG/ACT inhaler Inhale 2 puffs into the lungs 2 (two) times daily.  2 Inhaler  0  . metFORMIN (GLUCOPHAGE) 500 MG tablet Take 500 mg by mouth 2 (two) times daily with  a meal.        . NEOMYCIN-POLYMYXIN-HC, OTIC, (CORTISPORIN) 1 % SOLN 4 drops every 6 (six) hours.        Marland Kitchen oseltamivir (TAMIFLU) 75 MG capsule Take 1 capsule (75 mg total) by mouth 2 (two) times daily.  10 capsule  0  . progesterone (PROMETRIUM) 200 MG capsule Take 200 mg by mouth daily.        . promethazine (PHENERGAN) 25 MG tablet Take 1 tablet (25 mg total) by mouth every 6 (six) hours as needed for nausea.  10 tablet  0  . ranitidine (ZANTAC) 150 MG capsule Take 1 capsule (150 mg total) by mouth 2 (two) times daily.  60 capsule  1    Allergies as of 02/22/2011 - Review Complete 02/22/2011  Allergen Reaction Noted  . Ace inhibitors    . Cetirizine hcl    . Naproxen Itching 09/04/2009  .  Pineapple Hives 02/18/2011    Family History  Problem Relation Age of Onset  . Hypertension Mother   . Diabetes Mother   . Heart disease Mother   . Polycystic ovary syndrome Mother   . Diabetes Father   . Hypertension Father   . Diabetes Sister   . Pancreatitis Sister   . Cancer Maternal Grandmother     ? stomach cancer  . Diabetes Maternal Grandmother   . Cancer Maternal Grandfather     bone  . Glaucoma Maternal Grandfather   . Osteoporosis Maternal Grandfather   . Hypertension Paternal Grandfather   . Mental illness Paternal Grandfather     History   Social History  . Marital Status: Married    Spouse Name: N/A    Number of Children: 0  . Years of Education: N/A   Occupational History  . unemployed    Social History Main Topics  . Smoking status: Never Smoker   . Smokeless tobacco: Never Used  . Alcohol Use: No  . Drug Use: No  . Sexually Active: Not on file   Other Topics Concern  . Not on file   Social History Narrative   Regular excercise       Physical Exam: BP 138/76  Pulse 92  Ht 5\' 3"  (1.6 m)  Wt 122.018 kg (269 lb)  BMI 47.65 kg/m2  LMP 01/04/2011 Constitutional: generally fatigued appearing, morbidly obese Psychiatric: alert and oriented x3 Eyes: extraocular movements intact Mouth: oral pharynx moist, no lesions Neck: supple no lymphadenopathy Cardiovascular: heart regular rate and rhythm Lungs: clear to auscultation bilaterally Abdomen: soft, nontender, nondistended, no obvious ascites, no peritoneal signs, normal bowel sounds Extremities: no lower extremity edema bilaterally Skin: no lesions on visible extremities    Assessment and plan: 31 y.o. female with  chronic intermittent nausea, currently has influenza  She was tested positive positive for influenza 3 days ago. Her husband also has the flu, they are both here in the office today.  She has chronic nausea and I recommended she retry the proton pump inhibitors that worked  for her 2 years ago. She will call to report on her symptoms in 4 weeks.  She is morbidly obese and I explained to her that sometimes nausea is away of the body telling someone to eat less.

## 2011-02-22 NOTE — Patient Instructions (Addendum)
Please start prilosec or prevacid (OTC, generic omeprazole is very good option). This would be best to take 20-30 min prior to dinner meal. Gradual weight loss.  Obesity can play a role in your nausea.  Please call Dr. Christella Hartigan' office in 4 weeks to report on your symptoms. You have the FLU, stay hydrated.

## 2011-10-09 ENCOUNTER — Ambulatory Visit (INDEPENDENT_AMBULATORY_CARE_PROVIDER_SITE_OTHER): Payer: Managed Care, Other (non HMO) | Admitting: Family

## 2011-10-09 ENCOUNTER — Encounter: Payer: Self-pay | Admitting: Family

## 2011-10-09 VITALS — BP 100/70 | HR 82 | Temp 98.2°F | Resp 16 | Wt 280.0 lb

## 2011-10-09 DIAGNOSIS — J069 Acute upper respiratory infection, unspecified: Secondary | ICD-10-CM

## 2011-10-09 DIAGNOSIS — J029 Acute pharyngitis, unspecified: Secondary | ICD-10-CM

## 2011-10-09 DIAGNOSIS — N390 Urinary tract infection, site not specified: Secondary | ICD-10-CM | POA: Insufficient documentation

## 2011-10-09 LAB — POCT URINALYSIS DIPSTICK
Nitrite, UA: NEGATIVE
Urobilinogen, UA: 0.2

## 2011-10-09 MED ORDER — CIPROFLOXACIN HCL 500 MG PO TABS: 500.0000 mg | ORAL_TABLET | Freq: Two times a day (BID) | ORAL | Status: AC

## 2011-10-09 NOTE — Progress Notes (Signed)
Subjective:    Patient ID: Diane Patel, female    DOB: 13-Jul-1980, 31 y.o.   MRN: [id number]  HPI  Diane Patel is a 31 yr old female who presents today with chief complaint of nasal congestion.  She reports associated sinus drainage, head congestion, facial pain, sore throat and bilateral ear pain since Monday 9/1. Nephew came to visit on Saturday night- he was sick with cough/cold symptoms.  Reports felt feverish, but did not take her temperature.  She has tried robitussin and ibuprofen for facial pain- minimal relief with these measures.    She tells me that she is following with GYN for issues with abnormal menstrual bleeding.  She reports intermittent "blood in my urine."    Review of Systems    see hpi  Past Medical History  Diagnosis Date  . Asthma   . Depression   . Headaches, cluster     frequent  . Allergy   . Hyperlipidemia   . Obesity, morbid     History   Social History  . Marital Status: Married    Spouse Name: N/A    Number of Children: 0  . Years of Education: N/A   Occupational History  . unemployed    Social History Main Topics  . Smoking status: Never Smoker   . Smokeless tobacco: Never Used  . Alcohol Use: No  . Drug Use: No  . Sexually Active: Not on file   Other Topics Concern  . Not on file   Social History Narrative   Regular excercise    Past Surgical History  Procedure Date  . Appendectomy 2004  . Cholecystectomy 2010  . Tonsillectomy and adenoidectomy 1985    Family History  Problem Relation Age of Onset  . Hypertension Mother   . Diabetes Mother   . Heart disease Mother   . Polycystic ovary syndrome Mother   . Diabetes Father   . Hypertension Father   . Diabetes Sister   . Pancreatitis Sister   . Cancer Maternal Grandmother     ? stomach cancer  . Diabetes Maternal Grandmother   . Cancer Maternal Grandfather     bone  . Glaucoma Maternal Grandfather   . Osteoporosis Maternal Grandfather   . Hypertension Paternal  Grandfather   . Mental illness Paternal Grandfather     Allergies  Allergen Reactions  . Ace Inhibitors     REACTION: angioedema; lips, throat, tounge swelling  . Cetirizine Hcl   . Naproxen Itching  . Pineapple Hives    Current Outpatient Prescriptions on File Prior to Visit  Medication Sig Dispense Refill  . albuterol (PROVENTIL HFA;VENTOLIN HFA) 108 (90 BASE) MCG/ACT inhaler Inhale 2 puffs into the lungs every 6 (six) hours as needed for wheezing.  1 Inhaler  0  . budesonide-formoterol (SYMBICORT) 80-4.5 MCG/ACT inhaler Inhale 2 puffs into the lungs 2 (two) times daily.  2 Inhaler  0  . metFORMIN (GLUCOPHAGE) 500 MG tablet Take 500 mg by mouth 2 (two) times daily with a meal.        . NEOMYCIN-POLYMYXIN-HC, OTIC, (CORTISPORIN) 1 % SOLN 4 drops every 6 (six) hours.        . progesterone (PROMETRIUM) 200 MG capsule Take 200 mg by mouth daily.          BP 100/70  Pulse 82  Temp 98.2 F (36.8 C) (Oral)  Resp 16  Wt 280 lb (127.007 kg)  SpO2 99%    Objective:   Physical Exam  Constitutional:       Morbidly obese AA female, awake, alert, NAD  HENT:  Right Ear: Tympanic membrane and ear canal normal.  Left Ear: Tympanic membrane and ear canal normal.  Mouth/Throat: Posterior oropharyngeal erythema present. No posterior oropharyngeal edema.       Fine blisters noted on uvula +maxillary sinus tenderness to palpation  Cardiovascular: Normal rate and regular rhythm.   No murmur heard. Pulmonary/Chest: Effort normal and breath sounds normal.  Skin: Skin is warm and dry.  Psychiatric: She has a normal mood and affect. Her behavior is normal. Judgment and thought content normal.          Assessment & Plan:

## 2011-10-09 NOTE — Patient Instructions (Addendum)
Please add Claritin D daily for the next few days.  You may use motrin as needed for pain or chloraseptic spray for your throat.  Drink plenty of fluids. Call if symptoms worsen or if you are not improved in 2-3 days.

## 2011-10-09 NOTE — Assessment & Plan Note (Signed)
UA notes moderate leuks/moderate blood.  (Pt reports that she had some menstrual bleeding yesterday).  Will rx with cipro.

## 2011-10-09 NOTE — Assessment & Plan Note (Signed)
Symptoms most consistent at this point with URI.  Recommended claritin D once daily, motrin for pain, chloraseptic spray for throat.  Pt instructed to call if symptoms worsen, or if no improvement in 2-3 days.

## 2011-10-10 LAB — URINE CULTURE: Colony Count: 75000

## 2011-10-15 ENCOUNTER — Telehealth: Payer: Self-pay | Admitting: Family

## 2011-10-15 MED ORDER — CEFUROXIME AXETIL 500 MG PO TABS: 500.0000 mg | ORAL_TABLET | Freq: Two times a day (BID) | ORAL | Status: AC

## 2011-10-15 NOTE — Telephone Encounter (Signed)
Pls call pt and let her know that her urine culture grew group b strep a bacteria which is unlikely to be covered by the cipro she was given.  I would like to switch her to ceftin bid x 7 days.  Rx sent downstairs.

## 2011-10-15 NOTE — Telephone Encounter (Signed)
Attempted to reach pt and left detailed message on home # and to call if any questions. 

## 2011-12-05 ENCOUNTER — Encounter: Payer: Self-pay | Admitting: Internal Medicine

## 2011-12-05 ENCOUNTER — Ambulatory Visit (INDEPENDENT_AMBULATORY_CARE_PROVIDER_SITE_OTHER): Payer: Managed Care, Other (non HMO) | Admitting: Internal Medicine

## 2011-12-05 VITALS — BP 104/78 | HR 83 | Temp 98.1°F | Resp 18 | Wt 279.5 lb

## 2011-12-05 DIAGNOSIS — Z3490 Encounter for supervision of normal pregnancy, unspecified, unspecified trimester: Secondary | ICD-10-CM

## 2011-12-05 DIAGNOSIS — Z331 Pregnant state, incidental: Secondary | ICD-10-CM

## 2011-12-05 DIAGNOSIS — J329 Chronic sinusitis, unspecified: Secondary | ICD-10-CM

## 2011-12-05 MED ORDER — PRENATAL 19 PO TABS: 1.0000 | ORAL_TABLET | Freq: Every day | ORAL | Status: DC

## 2011-12-05 MED ORDER — AZITHROMYCIN 250 MG PO TABS: ORAL_TABLET | ORAL | Status: AC

## 2011-12-05 MED ORDER — ALBUTEROL SULFATE HFA 108 (90 BASE) MCG/ACT IN AERS: 2.0000 | INHALATION_SPRAY | Freq: Four times a day (QID) | RESPIRATORY_TRACT | Status: DC | PRN

## 2011-12-08 DIAGNOSIS — Z3490 Encounter for supervision of normal pregnancy, unspecified, unspecified trimester: Secondary | ICD-10-CM | POA: Insufficient documentation

## 2011-12-08 NOTE — Assessment & Plan Note (Signed)
Begin antibiotic therapy. Followup if no improvement or worsening. 

## 2011-12-08 NOTE — Assessment & Plan Note (Signed)
Obtain urine hCG

## 2011-12-08 NOTE — Progress Notes (Signed)
  Subjective:    Patient ID: Diane Patel, female    DOB: 24-Nov-1980, 31 y.o.   MRN: [id number]  HPI patient presents to clinic for evaluation of possible sinusitis. Next and a history of sinus maxillary pain and pressure as well as ear fullness and low-grade fevers. Is attempting pregnancy and request pregnancy test. Has not missed her period. Has received influenza vaccine for the season. No other alleviating or exacerbating factors.  Past Medical History  Diagnosis Date  . Asthma   . Depression   . Headaches, cluster     frequent  . Allergy   . Hyperlipidemia   . Obesity, morbid    Past Surgical History  Procedure Date  . Appendectomy 2004  . Cholecystectomy 2010  . Tonsillectomy and adenoidectomy 1985    reports that she has never smoked. She has never used smokeless tobacco. She reports that she does not drink alcohol or use illicit drugs. family history includes Cancer in her maternal grandfather and maternal grandmother; Diabetes in her father, maternal grandmother, mother, and sister; Glaucoma in her maternal grandfather; Heart disease in her mother; Hypertension in her father, mother, and paternal grandfather; Mental illness in her paternal grandfather; Osteoporosis in her maternal grandfather; Pancreatitis in her sister; and Polycystic ovary syndrome in her mother. Allergies  Allergen Reactions  . Ace Inhibitors     REACTION: angioedema; lips, throat, tounge swelling  . Cetirizine Hcl   . Naproxen Itching  . Pineapple Hives     Review of Systems see hpi     Objective:   Physical Exam  Nursing note and vitals reviewed. Constitutional: She appears well-developed and well-nourished. No distress.  HENT:  Head: Normocephalic and atraumatic.  Right Ear: Tympanic membrane, external ear and ear canal normal.  Left Ear: Tympanic membrane and external ear normal.  Nose: Right sinus exhibits maxillary sinus tenderness. Right sinus exhibits no frontal sinus tenderness.  Left sinus exhibits maxillary sinus tenderness. Left sinus exhibits no frontal sinus tenderness.  Mouth/Throat: Oropharynx is clear and moist. No oropharyngeal exudate.  Eyes: Conjunctivae normal and EOM are normal. No scleral icterus.  Neck: Neck supple.  Pulmonary/Chest: Effort normal and breath sounds normal. No respiratory distress. She has no wheezes. She has no rales.  Lymphadenopathy:    She has no cervical adenopathy.  Neurological: She is alert.  Skin: Skin is warm and dry. She is not diaphoretic.  Psychiatric: She has a normal mood and affect.          Assessment & Plan:

## 2012-03-12 ENCOUNTER — Encounter (HOSPITAL_COMMUNITY): Payer: Self-pay | Admitting: *Deleted

## 2012-03-12 ENCOUNTER — Emergency Department (HOSPITAL_COMMUNITY)
Admission: EM | Admit: 2012-03-12 | Discharge: 2012-03-12 | Disposition: A | Discharge status: Home / Self Care | Payer: Managed Care, Other (non HMO) | Attending: Emergency Medicine | Admitting: Emergency Medicine

## 2012-03-12 ENCOUNTER — Emergency Department (HOSPITAL_COMMUNITY): Payer: Managed Care, Other (non HMO)

## 2012-03-12 DIAGNOSIS — N949 Unspecified condition associated with female genital organs and menstrual cycle: Secondary | ICD-10-CM | POA: Insufficient documentation

## 2012-03-12 DIAGNOSIS — F329 Major depressive disorder, single episode, unspecified: Secondary | ICD-10-CM | POA: Insufficient documentation

## 2012-03-12 DIAGNOSIS — H6590 Unspecified nonsuppurative otitis media, unspecified ear: Secondary | ICD-10-CM

## 2012-03-12 DIAGNOSIS — R5381 Other malaise: Secondary | ICD-10-CM | POA: Insufficient documentation

## 2012-03-12 DIAGNOSIS — E282 Polycystic ovarian syndrome: Secondary | ICD-10-CM | POA: Insufficient documentation

## 2012-03-12 DIAGNOSIS — H9209 Otalgia, unspecified ear: Secondary | ICD-10-CM | POA: Insufficient documentation

## 2012-03-12 DIAGNOSIS — H6640 Suppurative otitis media, unspecified, unspecified ear: Secondary | ICD-10-CM | POA: Insufficient documentation

## 2012-03-12 DIAGNOSIS — R300 Dysuria: Secondary | ICD-10-CM | POA: Insufficient documentation

## 2012-03-12 DIAGNOSIS — J45909 Unspecified asthma, uncomplicated: Secondary | ICD-10-CM | POA: Insufficient documentation

## 2012-03-12 DIAGNOSIS — F3289 Other specified depressive episodes: Secondary | ICD-10-CM | POA: Insufficient documentation

## 2012-03-12 DIAGNOSIS — N938 Other specified abnormal uterine and vaginal bleeding: Secondary | ICD-10-CM | POA: Insufficient documentation

## 2012-03-12 DIAGNOSIS — R070 Pain in throat: Secondary | ICD-10-CM | POA: Insufficient documentation

## 2012-03-12 DIAGNOSIS — E785 Hyperlipidemia, unspecified: Secondary | ICD-10-CM | POA: Insufficient documentation

## 2012-03-12 DIAGNOSIS — G44009 Cluster headache syndrome, unspecified, not intractable: Secondary | ICD-10-CM | POA: Insufficient documentation

## 2012-03-12 LAB — CBC WITH DIFFERENTIAL/PLATELET
Basophils Relative: 1 % (ref 0–1)
Eosinophils Absolute: 0.2 10*3/uL (ref 0.0–0.7)
Hemoglobin: 12.3 g/dL (ref 12.0–15.0)
MCH: 25.2 pg — ABNORMAL LOW (ref 26.0–34.0)
MCHC: 31.9 g/dL (ref 30.0–36.0)
Monocytes Relative: 5 % (ref 3–12)
Neutrophils Relative %: 70 % (ref 43–77)

## 2012-03-12 LAB — BASIC METABOLIC PANEL
BUN: 9 mg/dL (ref 6–23)
Creatinine, Ser: 0.69 mg/dL (ref 0.50–1.10)
GFR calc Af Amer: >90 mL/min (ref >90)
GFR calc non Af Amer: >90 mL/min (ref >90)
Potassium: 4.1 mEq/L (ref 3.5–5.1)

## 2012-03-12 LAB — WET PREP, GENITAL: Yeast Wet Prep HPF POC: NONE SEEN

## 2012-03-12 MED ORDER — SODIUM CHLORIDE 0.9 % IV SOLN
Freq: Once | INTRAVENOUS | Status: AC
  Administered 2012-03-12: 09:00:00 via INTRAVENOUS

## 2012-03-12 MED ORDER — AMOXICILLIN 500 MG PO CAPS: 500.0000 mg | ORAL_CAPSULE | Freq: Three times a day (TID) | ORAL | Status: DC

## 2012-03-12 MED ORDER — MORPHINE SULFATE 4 MG/ML IJ SOLN
4.0000 mg | INTRAMUSCULAR | Status: DC | PRN
  Administered 2012-03-12: 4 mg via INTRAVENOUS
  Filled 2012-03-12: qty 1

## 2012-03-12 MED ORDER — IBUPROFEN 600 MG PO TABS: 600.0000 mg | ORAL_TABLET | Freq: Four times a day (QID) | ORAL | Status: DC | PRN

## 2012-03-12 MED ORDER — HYDROCODONE-ACETAMINOPHEN 5-325 MG PO TABS: 1.0000 | ORAL_TABLET | ORAL | Status: DC | PRN

## 2012-03-12 NOTE — ED Notes (Signed)
Patient transported to Ultrasound 

## 2012-03-12 NOTE — ED Notes (Signed)
Pt reports that for last 4-5 weeks she has had intermittent bleeding. Gyn dr Shawnie Pons in [place] prescribed medication to stop vaginal bleeding, it worked but bleeding came back. Pt reports obgyn was suppose to be doing a vaginal ultrasound but was waiting on records and now obgyn wife is sick and unavailable at present. Pt reports no sexual intercourse in 4-5 weeks. Reports rectal bleeding as well. Pt now reports bleeding is scant and uses 1 pad per pad.   Pt reports ear pain x2 weeks with sore throat, pt thinks she might have strep throat. Pt has been treated for earaches in the past.

## 2012-03-12 NOTE — ED Provider Notes (Signed)
History     CSN: [id number]  Arrival date & time 03/12/12  0730   First MD Initiated Contact with Patient 03/12/12 [phone]      Chief Complaint  Patient presents with  . Abdominal Pain  . Pelvic Pain  . Otalgia    (Consider location/radiation/quality/duration/timing/severity/associated sxs/prior treatment) HPI Comments: Pt comes in with cc of abd pain and vaginal bleeding. Pt has PCOD, and has been having irregular bleeding for months now. She has seen Dr. Jonita Albee, [facility], but reportedly has been unable to get f/u, or refills on her meds, as the Physician is on leave. For the past 3+ months she has been having daily vaginal bleeding, between trace amount to 1-3 pads of bleeding. There has been intermittent lower quadrant pain associated with this, which has become more constant and intense over the past 2-3 days. She has some dysuria. She is not pregnant as far as she knows, and no other vaginal discharge. Pt also has noticed when wiping after a BM, just not sure if the source is rectal or vaginal. The stools have been dark brown, and there is no hx of GI bleeding. Pt also states feeling more fatigued over the past week, but denies any chest pain, sob, fainting spells.   Patient is a 32 y.o. female presenting with abdominal pain, pelvic pain, and ear pain. The history is provided by the patient.  Abdominal Pain The primary symptoms of the illness include abdominal pain, fatigue, dysuria and vaginal bleeding. The primary symptoms of the illness do not include shortness of breath, nausea, vomiting, diarrhea or vaginal discharge.  The dysuria is not associated with hematuria.  Symptoms associated with the illness do not include constipation or hematuria.  Pelvic Pain Associated symptoms include abdominal pain. Pertinent negatives include no chest pain and no shortness of breath.  Otalgia Associated symptoms include sore throat and abdominal pain. Pertinent negatives include no diarrhea, no  vomiting, no neck pain and no cough.    Past Medical History  Diagnosis Date  . Asthma   . Depression   . Headaches, cluster     frequent  . Allergy   . Hyperlipidemia   . Obesity, morbid     Past Surgical History  Procedure Date  . Appendectomy 2004  . Cholecystectomy 2010  . Tonsillectomy and adenoidectomy 1985    Family History  Problem Relation Age of Onset  . Hypertension Mother   . Diabetes Mother   . Heart disease Mother   . Polycystic ovary syndrome Mother   . Diabetes Father   . Hypertension Father   . Diabetes Sister   . Pancreatitis Sister   . Cancer Maternal Grandmother     ? stomach cancer  . Diabetes Maternal Grandmother   . Cancer Maternal Grandfather     bone  . Glaucoma Maternal Grandfather   . Osteoporosis Maternal Grandfather   . Hypertension Paternal Grandfather   . Mental illness Paternal Grandfather     History  Substance Use Topics  . Smoking status: Never Smoker   . Smokeless tobacco: Never Used  . Alcohol Use: No    OB History    Grav Para Term Preterm Abortions TAB SAB Ect Mult Living                  Review of Systems  Constitutional: Positive for fatigue. Negative for activity change.  HENT: Positive for ear pain and sore throat. Negative for facial swelling and neck pain.   Respiratory: Negative for  cough, shortness of breath and wheezing.   Cardiovascular: Negative for chest pain.  Gastrointestinal: Positive for abdominal pain. Negative for nausea, vomiting, diarrhea, constipation, blood in stool and abdominal distention.  Genitourinary: Positive for dysuria, vaginal bleeding and pelvic pain. Negative for hematuria, vaginal discharge and difficulty urinating.  Skin: Negative for color change.  Neurological: Negative for dizziness and speech difficulty.  Hematological: Does not bruise/bleed easily.  Psychiatric/Behavioral: Negative for confusion.    Allergies  Ace inhibitors; Cetirizine hcl; Naproxen; and  Pineapple  Home Medications   Current Outpatient Rx  Name  Route  Sig  Dispense  Refill  . IBUPROFEN 200 MG PO TABS   Oral   Take 200-400 mg by mouth every 6 (six) hours as needed. Pain         . PRENATAL 19 PO   Oral   Take 1 tablet by mouth daily.           BP 133/87  Pulse 70  Temp 97.8 F (36.6 C) (Oral)  Resp 17  SpO2 99%  Physical Exam  Nursing note and vitals reviewed. Constitutional: She is oriented to person, place, and time. She appears well-developed and well-nourished.  HENT:  Head: Normocephalic and atraumatic.       Bilateral ear exam shows dull TM with loss of land mark, no erythema. Posterior pharynx exam is normal, no cervical lymphadenopathy   Eyes: Conjunctivae normal and EOM are normal. Pupils are equal, round, and reactive to light.  Neck: Normal range of motion. Neck supple.  Cardiovascular: Normal rate, regular rhythm, normal heart sounds and intact distal pulses.   No murmur heard. Pulmonary/Chest: Effort normal. No respiratory distress. She has no wheezes.  Abdominal: Soft. Bowel sounds are normal. She exhibits no distension. There is no tenderness. There is no rebound and no guarding.  Genitourinary: Vagina normal and uterus normal.       External exam - normal, no lesions Speculum exam: Pt has blood in the vaginal vault, and no lacerations. Bleeding appears to be from the cervix. Bimanual exam: Patient has no CMT, no adnexal tenderness or fullness and cervical os is closed  Neurological: She is alert and oriented to person, place, and time.  Skin: Skin is warm and dry.    ED Course  Procedures (including critical care time)   Labs Reviewed  CBC WITH DIFFERENTIAL  BASIC METABOLIC PANEL  PREGNANCY, URINE  GC/CHLAMYDIA PROBE AMP  WET PREP, GENITAL  URINALYSIS, ROUTINE W REFLEX MICROSCOPIC   No results found.   No diagnosis found.    MDM  Pt comes in with cc of vaginal bleeding and sore throat and ear ache.  Ear ache - we  advocated wait and watch approach with antibiotics. If better in 2-3 days, she will not use Antibiotics. Pt has middle ear effusion, and her ear ache has been going on for weeks now - so if the treatment doesn't work, she has been requested to see ENT.  Pt's GU exam shows active bleeding. Hb is stable. Will get pelvis US. Will ask her to see Gyne again.   Derwood Kaplan, MD 03/12/12 [phone]

## 2012-08-28 ENCOUNTER — Other Ambulatory Visit: Payer: Self-pay | Admitting: Family Medicine

## 2012-08-28 DIAGNOSIS — M549 Dorsalgia, unspecified: Secondary | ICD-10-CM

## 2012-09-10 ENCOUNTER — Other Ambulatory Visit: Payer: Managed Care, Other (non HMO)

## 2012-09-12 ENCOUNTER — Emergency Department (HOSPITAL_BASED_OUTPATIENT_CLINIC_OR_DEPARTMENT_OTHER)
Admission: EM | Admit: 2012-09-12 | Discharge: 2012-09-12 | Disposition: A | Discharge status: Home / Self Care | Payer: Managed Care, Other (non HMO) | Attending: Emergency Medicine | Admitting: Emergency Medicine

## 2012-09-12 ENCOUNTER — Emergency Department (HOSPITAL_BASED_OUTPATIENT_CLINIC_OR_DEPARTMENT_OTHER): Payer: Managed Care, Other (non HMO)

## 2012-09-12 ENCOUNTER — Encounter (HOSPITAL_BASED_OUTPATIENT_CLINIC_OR_DEPARTMENT_OTHER): Payer: Self-pay | Admitting: *Deleted

## 2012-09-12 DIAGNOSIS — M1990 Unspecified osteoarthritis, unspecified site: Secondary | ICD-10-CM

## 2012-09-12 DIAGNOSIS — M25561 Pain in right knee: Secondary | ICD-10-CM

## 2012-09-12 DIAGNOSIS — Z3202 Encounter for pregnancy test, result negative: Secondary | ICD-10-CM | POA: Insufficient documentation

## 2012-09-12 DIAGNOSIS — Z8659 Personal history of other mental and behavioral disorders: Secondary | ICD-10-CM | POA: Insufficient documentation

## 2012-09-12 DIAGNOSIS — M549 Dorsalgia, unspecified: Secondary | ICD-10-CM

## 2012-09-12 DIAGNOSIS — IMO0002 Reserved for concepts with insufficient information to code with codable children: Secondary | ICD-10-CM | POA: Insufficient documentation

## 2012-09-12 DIAGNOSIS — R209 Unspecified disturbances of skin sensation: Secondary | ICD-10-CM | POA: Insufficient documentation

## 2012-09-12 DIAGNOSIS — M1710 Unilateral primary osteoarthritis, unspecified knee: Secondary | ICD-10-CM | POA: Insufficient documentation

## 2012-09-12 DIAGNOSIS — J45909 Unspecified asthma, uncomplicated: Secondary | ICD-10-CM | POA: Insufficient documentation

## 2012-09-12 DIAGNOSIS — Z862 Personal history of diseases of the blood and blood-forming organs and certain disorders involving the immune mechanism: Secondary | ICD-10-CM | POA: Insufficient documentation

## 2012-09-12 DIAGNOSIS — M25569 Pain in unspecified knee: Secondary | ICD-10-CM | POA: Insufficient documentation

## 2012-09-12 DIAGNOSIS — Z8639 Personal history of other endocrine, nutritional and metabolic disease: Secondary | ICD-10-CM | POA: Insufficient documentation

## 2012-09-12 DIAGNOSIS — G44009 Cluster headache syndrome, unspecified, not intractable: Secondary | ICD-10-CM | POA: Insufficient documentation

## 2012-09-12 HISTORY — DX: Dorsalgia, unspecified: M54.9

## 2012-09-12 MED ORDER — DIAZEPAM 5 MG/ML IJ SOLN
10.0000 mg | Freq: Once | INTRAMUSCULAR | Status: AC
  Administered 2012-09-12: 10 mg via INTRAMUSCULAR
  Filled 2012-09-12: qty 2

## 2012-09-12 MED ORDER — OXYCODONE-ACETAMINOPHEN 5-325 MG PO TABS
1.0000 | ORAL_TABLET | Freq: Once | ORAL | Status: AC
  Administered 2012-09-12: 1 via ORAL
  Filled 2012-09-12 (×2): qty 1

## 2012-09-12 MED ORDER — CYCLOBENZAPRINE HCL 10 MG PO TABS: 10.0000 mg | ORAL_TABLET | Freq: Two times a day (BID) | ORAL | Status: DC | PRN

## 2012-09-12 MED ORDER — HYDROCODONE-ACETAMINOPHEN 5-325 MG PO TABS: 1.0000 | ORAL_TABLET | Freq: Three times a day (TID) | ORAL | Status: DC | PRN

## 2012-09-12 NOTE — ED Provider Notes (Signed)
CSN: [id number]     Arrival date & time 09/12/12  1113 History     First MD Initiated Contact with Patient 09/12/12 1206     Chief Complaint  Patient presents with  . Back Pain   (Consider location/radiation/quality/duration/timing/severity/associated sxs/prior Treatment) The history is provided by the patient and the spouse. No language interpreter was used.  Diane Patel is a 32 y/o F with PMHx of asthma, depression, headaches, obesity, HLD, presenting to the ED with back pain and right knee pain after sustaining a fall. Patient reported that she was reaching for something up high, stated that she was wearing small high heels, stepped on a chair and when she was stepping down from chair where her heel got caught in a drain opening and led to the patient falling landing on a mini-fridge on her lower back and falling onto the right knee. Patient reported that she is having discomfort to her lower back, described as a pulsating pain with no radiation, the right knee is described as a sharp pain that runs down the right leg to the right foot. Patient reported that she has been feeling mild numbness to the feet bilaterally. Stated that she has history of back pain, is followed by Dr. Oleta Mouse - stated that she had a MRI scheduled for last week, but had to cancel and has another MRI schedule for this coming Thursday. Patient reported that Dr. Oleta Mouse is trying to rule out a slipped disc. Denied head injury, LOC, blurred vision, sudden loss of vision, loss of sensation to extremities, headache, dizziness, chest pain, shortness of breath, difficulty breathing, urinary and bowel incontinence.    Past Medical History  Diagnosis Date  . Asthma   . Depression   . Headaches, cluster     frequent  . Allergy   . Hyperlipidemia   . Obesity, morbid   . Back pain    Past Surgical History  Procedure Laterality Date  . Appendectomy  2004  . Cholecystectomy  2010  . Tonsillectomy and adenoidectomy  1985    Family History  Problem Relation Age of Onset  . Hypertension Mother   . Diabetes Mother   . Heart disease Mother   . Polycystic ovary syndrome Mother   . Diabetes Father   . Hypertension Father   . Diabetes Sister   . Pancreatitis Sister   . Cancer Maternal Grandmother     ? stomach cancer  . Diabetes Maternal Grandmother   . Cancer Maternal Grandfather     bone  . Glaucoma Maternal Grandfather   . Osteoporosis Maternal Grandfather   . Hypertension Paternal Grandfather   . Mental illness Paternal Grandfather    History  Substance Use Topics  . Smoking status: Never Smoker   . Smokeless tobacco: Never Used  . Alcohol Use: No   OB History   Grav Para Term Preterm Abortions TAB SAB Ect Mult Living                 Review of Systems  Constitutional: Negative for fever and chills.  HENT: Negative for neck pain and neck stiffness.   Eyes: Negative for visual disturbance.  Respiratory: Negative for chest tightness and shortness of breath.   Gastrointestinal: Negative for vomiting.  Musculoskeletal: Positive for back pain and arthralgias (right knee).  Neurological: Positive for numbness (bilateral feet). Negative for dizziness, weakness and headaches.  All other systems reviewed and are negative.    Allergies  Ace inhibitors; Cetirizine hcl; Naproxen; and Pineapple  Home Medications   Current Outpatient Rx  Name  Route  Sig  Dispense  Refill  . cyclobenzaprine (FLEXERIL) 10 MG tablet   Oral   Take 1 tablet (10 mg total) by mouth 2 (two) times daily as needed for muscle spasms.   20 tablet   0   . HYDROcodone-acetaminophen (NORCO) 5-325 MG per tablet   Oral   Take 1 tablet by mouth every 8 (eight) hours as needed for pain.   9 tablet   0   . ibuprofen (ADVIL,MOTRIN) 200 MG tablet   Oral   Take 200-400 mg by mouth every 6 (six) hours as needed. Pain          BP 128/95  Pulse 86  Temp(Src) 99 F (37.2 C)  Resp 18  SpO2 99% Physical Exam  Nursing  note and vitals reviewed. Constitutional: She is oriented to person, place, and time. She appears well-developed and well-nourished. No distress.  HENT:  Head: Normocephalic and atraumatic.  Eyes: Conjunctivae and EOM are normal. Pupils are equal, round, and reactive to light. Right eye exhibits no discharge. Left eye exhibits no discharge.  Neck: Normal range of motion. Neck supple.  Negative neck stiffness Negative nuchal rigidity Negative cervical spine tenderness upon palpation   Cardiovascular: Normal rate, regular rhythm and normal heart sounds.  Exam reveals no friction rub.   No murmur heard. Pulses:      Radial pulses are 2+ on the right side, and 2+ on the left side.       Dorsalis pedis pulses are 2+ on the right side, and 2+ on the left side.  Pulmonary/Chest: Effort normal and breath sounds normal. No respiratory distress. She has no wheezes. She has no rales. She exhibits no tenderness.  Abdominal:  Obese   Musculoskeletal: She exhibits tenderness.       Right knee: She exhibits decreased range of motion. She exhibits no effusion, no ecchymosis, no deformity, no laceration, no erythema and normal alignment. Tenderness found. Patellar tendon tenderness noted.       Lumbar back: She exhibits decreased range of motion (secondary to pain) and tenderness. She exhibits no bony tenderness, no swelling, no edema and no deformity.       Back:       Legs: Negative swelling and deformity noted to the right knee Negative erythema, warmth to touch Pain upon palpation to the anterior aspect of the right knee Pain with valgus and varus tension. Negative anterior and posterior draw sign. Mild discomfort noted with MacMurray's sign. Stable right knee joint  Pain upon palpation to the lumbosacral region to the paraspinal region. Patient able to flex and extend - discomfort noted. Negative deformities noted to the lumbosacral region. Full ROM to bilateral hips.   Lymphadenopathy:    She has  no cervical adenopathy.  Neurological: She is alert and oriented to person, place, and time. No cranial nerve deficit. She exhibits normal muscle tone. Coordination normal.  Cranial nerves III-XII grossly intact Sensation intact to BLE and BUE with differentiation to sharp and dull touch Strength 5+/5+ with resistance to BUE and BLE Gait proper and balanced - negative sway - mild discomfort noted when applying pressure to the right leg  Skin: Skin is warm and dry. No rash noted. She is not diaphoretic. No erythema.  Psychiatric: She has a normal mood and affect. Her behavior is normal. Thought content normal.    ED Course   Procedures (including critical care time)  Labs Reviewed  PREGNANCY, URINE   Dg Ankle Complete Right  09/12/2012   *RADIOLOGY REPORT*  Clinical Data: Fall with right ankle pain.  RIGHT ANKLE - COMPLETE 3+ VIEW  Comparison: None  Findings: There is no evidence of acute fracture, subluxation or dislocation. The ankle mortise is intact. No focal bony lesions are identified. The talar dome is unremarkable.  IMPRESSION: No evidence of acute bony abnormality.   Original Report Authenticated By: Harmon Pier, M.D.   Ct Lumbar Spine Wo Contrast  09/12/2012   *RADIOLOGY REPORT*  Clinical Data: Back pain.  Fall  CT LUMBAR SPINE WITHOUT CONTRAST  Technique:  Multidetector CT imaging of the lumbar spine was performed without intravenous contrast administration. Multiplanar CT image reconstructions were also generated.  Comparison: CT abdomen pelvis 03/06/2007  Findings: Negative for lumbar fracture.  Negative for pars defect. No mass lesion is identified.  Minimal disc and facet degeneration at L4-5 and L5-S1.  No significant spinal stenosis.  IMPRESSION: Mild degenerative change.  No acute abnormality and no fracture.   Original Report Authenticated By: Janeece Riggers, M.D.   Dg Knee Complete 4 Views Right  09/12/2012   *RADIOLOGY REPORT*  Clinical Data: Fall with right knee pain.  RIGHT  KNEE - COMPLETE 4+ VIEW  Comparison: None  Findings: There is no evidence of acute fracture, subluxation or dislocation. There is no evidence of joint effusion. Mild tricompartmental degenerative changes are present. No focal bony lesions are identified.  IMPRESSION: No evidence of acute abnormality.  Mild tricompartmental degenerative changes.   Original Report Authenticated By: Harmon Pier, M.D.   1. Back pain   2. Right knee pain   3. DJD (degenerative joint disease)     MDM  Patient presenting to the ED with lower back pain and right knee pain secondary to fall. Patient has history of back pain - is being followed by Dr. Oleta Mouse has MRI scheduled for Thursday 09/17/2012.  Alert and oriented. Full ROM to upper and lower extremities bilaterally. Discomfort upon palpation to the paraspinal lumbosacral region. Pain with palpation to the right knee, anterior aspect - stable right knee joint. Mild discomfort when walking, especially when applying pressure to the right leg. Sensation intact, strength intact, pulses palpable. Negative neurological deficits noted. Urine pregnancy negative.  Xray imaging negative for acute injuries to the right knee and ankle. CT scan negative for acute abnormalities, degenerative disease noted to L4-L5.  Pain controlled in ED setting. Doubt cauda equina syndrome. Suspicion to be ankle and knee sprain. Patient with degenerative joint disease. Patient stable, afebrile, ambulatory. Placed in knee sleeve and ASO brace. Discharged patient with pain medications - discussed course and disposal and precautions - and muscle relaxers. Discussed with patient to rest and avoid strenuous activities. Recommended patient to get MRI scheduled for this Thursday, 09/17/2012. Referred to PCP and Dr. Oleta Mouse. Discussed with patient to apply heat compressions and water therapy. Discussed with patient to monitor symptoms closely and if symptoms worsen or change to report back to the ED -strict return  instructions given. Patient agreed to plan of care, understood, all questions answered.   Raymon Mutton, PA-C 09/13/12 0040

## 2012-09-12 NOTE — ED Notes (Signed)
Pt has hx of back pain.  Reports falling this morning, landing on a mini-fridge.  Reports increase in pain in lower back.  Also reports injuring (R) knee when falling.  Pt ambulatory in heeled shoes.  Knee is not bruised, swollen.  Pt is tender on palpation of her knee.

## 2012-09-13 NOTE — ED Provider Notes (Signed)
Medical screening examination/treatment/procedure(s) were performed by non-physician practitioner and as supervising physician I was immediately available for consultation/collaboration.   Junius Argyle, MD 09/13/12 [phone]

## 2012-09-15 ENCOUNTER — Ambulatory Visit
Admission: RE | Admit: 2012-09-15 | Discharge: 2012-09-15 | Disposition: A | Discharge status: Home / Self Care | Payer: 59 | Source: Ambulatory Visit | Attending: Family Medicine | Admitting: Family Medicine

## 2012-09-15 DIAGNOSIS — M549 Dorsalgia, unspecified: Secondary | ICD-10-CM

## 2012-09-16 ENCOUNTER — Other Ambulatory Visit: Payer: Managed Care, Other (non HMO)

## 2012-09-17 ENCOUNTER — Other Ambulatory Visit: Payer: Managed Care, Other (non HMO)

## 2012-09-23 ENCOUNTER — Telehealth: Payer: Self-pay | Admitting: *Deleted

## 2012-09-23 ENCOUNTER — Encounter: Payer: Self-pay | Admitting: Physician Assistant

## 2012-09-23 ENCOUNTER — Ambulatory Visit (INDEPENDENT_AMBULATORY_CARE_PROVIDER_SITE_OTHER): Payer: 59 | Admitting: Physician Assistant

## 2012-09-23 VITALS — BP 118/86 | HR 85 | Temp 98.2°F | Resp 20 | Wt 295.0 lb

## 2012-09-23 DIAGNOSIS — J329 Chronic sinusitis, unspecified: Secondary | ICD-10-CM

## 2012-09-23 DIAGNOSIS — J4521 Mild intermittent asthma with (acute) exacerbation: Secondary | ICD-10-CM

## 2012-09-23 DIAGNOSIS — J45901 Unspecified asthma with (acute) exacerbation: Secondary | ICD-10-CM

## 2012-09-23 MED ORDER — AMOXICILLIN-POT CLAVULANATE 875-125 MG PO TABS: 1.0000 | ORAL_TABLET | Freq: Two times a day (BID) | ORAL | Status: DC

## 2012-09-23 MED ORDER — FLUTICASONE PROPIONATE 50 MCG/ACT NA SUSP: 2.0000 | Freq: Every day | NASAL | Status: DC

## 2012-09-23 MED ORDER — ALBUTEROL SULFATE HFA 108 (90 BASE) MCG/ACT IN AERS: 2.0000 | INHALATION_SPRAY | Freq: Four times a day (QID) | RESPIRATORY_TRACT | Status: DC | PRN

## 2012-09-23 NOTE — Telephone Encounter (Signed)
Patient called office and was placed on Waldon Merl, PA-C schedule at 9:00am; triaged SOB to be Asthma Exacerbation d/t head & chest congestion/SLS

## 2012-09-23 NOTE — Progress Notes (Signed)
Patient ID: Diane Patel, female   DOB: 1980-06-26, 32 y.o.   MRN: [id number]  Patient presents to clinic today c/o sinus pain, pressure and congestion for > 1 wk.  Information was obtained from the patient.  Patient states that she developed nasal congestion that moved into her sinuses.  Has been having intense pressure and some facial pain.  Endorses headache.  Endorses cough productive of clear sputum initially, but sputum is now yellow-green.  Denies hemoptysis.  Endorses post-nasal drip and scratchy throat.  Endorses mild occasional wheezing and shortness of breath.  Endorses hx of mild asthma.  Has outdated albuterol inhaler at home.  Only uses inhaler when she has a URI.  Denies fever, chills, night sweats.  Endorses some nausea, but denies V/D/C, abdominal pain or rash.  Has a young nephew who was sick recently but denies sick contact within the home.  Past Medical History  Diagnosis Date  . Asthma   . Depression   . Headaches, cluster     frequent  . Allergy   . Hyperlipidemia   . Obesity, morbid   . Back pain     Current Outpatient Prescriptions on File Prior to Visit  Medication Sig Dispense Refill  . HYDROcodone-acetaminophen (NORCO) 5-325 MG per tablet Take 1 tablet by mouth every 8 (eight) hours as needed for pain.  9 tablet  0  . ibuprofen (ADVIL,MOTRIN) 200 MG tablet Take 200-400 mg by mouth every 6 (six) hours as needed. Pain       No current facility-administered medications on file prior to visit.    Allergies  Allergen Reactions  . Ace Inhibitors     REACTION: angioedema; lips, throat, tounge swelling  . Cetirizine Hcl   . Flexeril [Cyclobenzaprine] Itching  . Naproxen Itching  . Pineapple Hives    Family History  Problem Relation Age of Onset  . Hypertension Mother   . Diabetes Mother   . Heart disease Mother   . Polycystic ovary syndrome Mother   . Diabetes Father   . Hypertension Father   . Diabetes Sister   . Pancreatitis Sister   . Cancer Maternal  Grandmother     ? stomach cancer  . Diabetes Maternal Grandmother   . Cancer Maternal Grandfather     bone  . Glaucoma Maternal Grandfather   . Osteoporosis Maternal Grandfather   . Hypertension Paternal Grandfather   . Mental illness Paternal Grandfather     History   Social History  . Marital Status: Married    Spouse Name: N/A    Number of Children: 0  . Years of Education: N/A   Occupational History  . unemployed    Social History Main Topics  . Smoking status: Never Smoker   . Smokeless tobacco: Never Used  . Alcohol Use: No  . Drug Use: No  . Sexual Activity: None   Other Topics Concern  . None   Social History Narrative   Regular excercise    Review of Systems - General ROS: positive for  - malaise negative for - chills, fever or night sweats ENT ROS: positive for - headaches, nasal congestion, nasal discharge, sinus pain and sore throat negative for - epistaxis or oral lesions Respiratory ROS: positive for - cough, shortness of breath, sputum changes and wheezing negative for - hemoptysis or pleuritic pain Cardiovascular ROS: negative for - chest pain or palpitations Gastrointestinal ROS: no abdominal pain, change in bowel habits, or black or bloody stools positive for - nausea Musculoskeletal  ROS: negative  Filed Vitals:   09/23/12 0902  BP: 118/86  Pulse: 85  Temp: 98.2 F (36.8 C)  Resp: 20     General appearance: alert, cooperative, appears stated age, no distress and morbidly obese Head: Normocephalic, without obvious abnormality, atraumatic, sinuses tender to percussion Ears: normal TM's and external ear canals both ears Nose: Nares normal.  Edematous nasal mucosa with yellow drainage Throat: lips, mucosa, and tongue normal; teeth and gums normal Lungs: clear to auscultation bilaterally Heart: regular rate and rhythm, S1, S2 normal, no murmur, click, rub or gallop Pulses: 2+ and symmetric Skin: Skin color, texture, turgor normal. No rashes  or lesions Lymph nodes: Cervical, supraclavicular, and axillary nodes normal.  Recent Results (from the past 2160 hour(s))  PREGNANCY, URINE     Status: None   Collection Time    09/12/12  1:23 PM      Result Value Range   Preg Test, Ur NEGATIVE  NEGATIVE   Comment:            THE SENSITIVITY OF THIS     METHODOLOGY IS >20 mIU/mL.   Assessment/Plan: No problem-specific assessment & plan notes found for this encounter.

## 2012-09-23 NOTE — Assessment & Plan Note (Signed)
Rx Augmentin.  Rx Flonase.  Patient to continue salt water gargles and warm liquids for throat.  Has refill for albuterol inhaler to use during course of this illness.  Encourage rest and hydration.

## 2012-09-23 NOTE — Telephone Encounter (Signed)
[facility] Triage Call Report Triage Record Num: [id number] Operator: Aundra Millet Patient Name: Diane Patel Call Date & Time: 09/23/2012 7:08:31AM Patient Phone: [phone] PCP: [facility] Patient Gender: Female PCP Fax : [phone] Patient DOB: [birth year] Practice Name: [facility] Reason for Call: Caller: Ulla/Patient; PCP: Peggyann Juba, Melissa (Adults only); CB#: [phone]; Call regarding Cough/Congestion; Nasal congestion and sinus headache since 09/13/2012 and taking OTC Cold / Sinus meds but not improving. She has developed a yellow productive cough since 09/21/2012 and sometimes has seen streak of blood in sputum. Pt has asthma, but doesnt have her Inhaler . Feeling more SOB this morning. RN reached See ED Immediately for worsening cough and known Respiratory condition - treatment not available per Cough - Adult protocol. RN advised to proceed to [facility] ED now and she agreed Protocol(s) Used: Cough - Adult Recommended Outcome per Protocol: See ED Immediately Reason for Outcome: New or worsening cough AND known cardiac or respiratory condition not responding to treatment OR treatment not available Care Advice: ~ Another adult should drive. Call EMS 911 if develop new onset or increasing confusion or lethargy; breathing problems continue to worsen or skin becomes bluish/gray; develops chest pain. ~ ~ IMMEDIATE ACTION 09/23/2012 7:22:06AM Page 1 of 1 [id number]

## 2012-09-23 NOTE — Assessment & Plan Note (Signed)
Refill albuterol inhaler ?

## 2012-09-23 NOTE — Patient Instructions (Signed)
Please take antibiotic as prescribed until all medication is gone.  Prescription refill for albuterol, use as prescribed.  Prescription given for Flonase.  Continue salt water gargles or warm liquids for sore throat.  If symptoms persist or worsen, please call or return to clinic.   Sinusitis Sinusitis is redness, soreness, and swelling (inflammation) of the paranasal sinuses. Paranasal sinuses are air pockets within the bones of your face (beneath the eyes, the middle of the forehead, or above the eyes). In healthy paranasal sinuses, mucus is able to drain out, and air is able to circulate through them by way of your nose. However, when your paranasal sinuses are inflamed, mucus and air can become trapped. This can allow bacteria and other germs to grow and cause infection. Sinusitis can develop quickly and last only a short time (acute) or continue over a long period (chronic). Sinusitis that lasts for more than 12 weeks is considered chronic.  CAUSES  Causes of sinusitis include:  Allergies.  Structural abnormalities, such as displacement of the cartilage that separates your nostrils (deviated septum), which can decrease the air flow through your nose and sinuses and affect sinus drainage.  Functional abnormalities, such as when the small hairs (cilia) that line your sinuses and help remove mucus do not work properly or are not present. SYMPTOMS  Symptoms of acute and chronic sinusitis are the same. The primary symptoms are pain and pressure around the affected sinuses. Other symptoms include:  Upper toothache.  Earache.  Headache.  Bad breath.  Decreased sense of smell and taste.  A cough, which worsens when you are lying flat.  Fatigue.  Fever.  Thick drainage from your nose, which often is green and may contain pus (purulent).  Swelling and warmth over the affected sinuses. DIAGNOSIS  Your caregiver will perform a physical exam. During the exam, your caregiver may:  Look  in your nose for signs of abnormal growths in your nostrils (nasal polyps).  Tap over the affected sinus to check for signs of infection.  View the inside of your sinuses (endoscopy) with a special imaging device with a light attached (endoscope), which is inserted into your sinuses. If your caregiver suspects that you have chronic sinusitis, one or more of the following tests may be recommended:  Allergy tests.  Nasal culture A sample of mucus is taken from your nose and sent to a lab and screened for bacteria.  Nasal cytology A sample of mucus is taken from your nose and examined by your caregiver to determine if your sinusitis is related to an allergy. TREATMENT  Most cases of acute sinusitis are related to a viral infection and will resolve on their own within 10 days. Sometimes medicines are prescribed to help relieve symptoms (pain medicine, decongestants, nasal steroid sprays, or saline sprays).  However, for sinusitis related to a bacterial infection, your caregiver will prescribe antibiotic medicines. These are medicines that will help kill the bacteria causing the infection.  Rarely, sinusitis is caused by a fungal infection. In theses cases, your caregiver will prescribe antifungal medicine. For some cases of chronic sinusitis, surgery is needed. Generally, these are cases in which sinusitis recurs more than 3 times per year, despite other treatments. HOME CARE INSTRUCTIONS   Drink plenty of water. Water helps thin the mucus so your sinuses can drain more easily.  Use a humidifier.  Inhale steam 3 to 4 times a day (for example, sit in the bathroom with the shower running).  Apply a warm, moist  washcloth to your face 3 to 4 times a day, or as directed by your caregiver.  Use saline nasal sprays to help moisten and clean your sinuses.  Take over-the-counter or prescription medicines for pain, discomfort, or fever only as directed by your caregiver. SEEK IMMEDIATE MEDICAL CARE  IF:  You have increasing pain or severe headaches.  You have nausea, vomiting, or drowsiness.  You have swelling around your face.  You have vision problems.  You have a stiff neck.  You have difficulty breathing. MAKE SURE YOU:   Understand these instructions.  Will watch your condition.  Will get help right away if you are not doing well or get worse. Document Released: 01/21/2005 Document Revised: 04/15/2011 Document Reviewed: 02/05/2011 [facility] Patient Information 2014 [place], [place].

## 2012-09-28 ENCOUNTER — Telehealth: Payer: Self-pay | Admitting: *Deleted

## 2012-09-28 NOTE — Telephone Encounter (Signed)
Pt left message requesting work note for d.o.s 09/23/12. Note printed and placed at front desk for pick up. Left detailed message on work# re: letter completion and to call if any questions.

## 2012-12-21 ENCOUNTER — Emergency Department (HOSPITAL_BASED_OUTPATIENT_CLINIC_OR_DEPARTMENT_OTHER)
Admission: EM | Admit: 2012-12-21 | Discharge: 2012-12-21 | Disposition: A | Discharge status: Home / Self Care | Payer: Managed Care, Other (non HMO) | Attending: Emergency Medicine | Admitting: Emergency Medicine

## 2012-12-21 ENCOUNTER — Encounter: Payer: Self-pay | Admitting: Family

## 2012-12-21 ENCOUNTER — Ambulatory Visit (INDEPENDENT_AMBULATORY_CARE_PROVIDER_SITE_OTHER): Payer: Managed Care, Other (non HMO) | Admitting: Family

## 2012-12-21 ENCOUNTER — Encounter (HOSPITAL_BASED_OUTPATIENT_CLINIC_OR_DEPARTMENT_OTHER): Payer: Self-pay | Admitting: Emergency Medicine

## 2012-12-21 ENCOUNTER — Emergency Department (HOSPITAL_BASED_OUTPATIENT_CLINIC_OR_DEPARTMENT_OTHER): Payer: Managed Care, Other (non HMO)

## 2012-12-21 VITALS — BP 120/80 | HR 73 | Temp 97.6°F | Resp 16 | Wt 291.0 lb

## 2012-12-21 DIAGNOSIS — R0789 Other chest pain: Secondary | ICD-10-CM

## 2012-12-21 DIAGNOSIS — R5381 Other malaise: Secondary | ICD-10-CM | POA: Insufficient documentation

## 2012-12-21 DIAGNOSIS — IMO0002 Reserved for concepts with insufficient information to code with codable children: Secondary | ICD-10-CM | POA: Insufficient documentation

## 2012-12-21 DIAGNOSIS — D649 Anemia, unspecified: Secondary | ICD-10-CM | POA: Insufficient documentation

## 2012-12-21 DIAGNOSIS — N920 Excessive and frequent menstruation with regular cycle: Secondary | ICD-10-CM | POA: Insufficient documentation

## 2012-12-21 DIAGNOSIS — Z8669 Personal history of other diseases of the nervous system and sense organs: Secondary | ICD-10-CM | POA: Insufficient documentation

## 2012-12-21 DIAGNOSIS — Z79899 Other long term (current) drug therapy: Secondary | ICD-10-CM | POA: Insufficient documentation

## 2012-12-21 DIAGNOSIS — J45901 Unspecified asthma with (acute) exacerbation: Secondary | ICD-10-CM | POA: Insufficient documentation

## 2012-12-21 DIAGNOSIS — Z8659 Personal history of other mental and behavioral disorders: Secondary | ICD-10-CM | POA: Insufficient documentation

## 2012-12-21 DIAGNOSIS — K3189 Other diseases of stomach and duodenum: Secondary | ICD-10-CM

## 2012-12-21 DIAGNOSIS — G43909 Migraine, unspecified, not intractable, without status migrainosus: Secondary | ICD-10-CM

## 2012-12-21 LAB — CBC WITH DIFFERENTIAL/PLATELET
Eosinophils Absolute: 0.2 10*3/uL (ref 0.0–0.7)
Eosinophils Relative: 3 % (ref 0–5)
Lymphs Abs: 2.5 10*3/uL (ref 0.7–4.0)
MCH: 21.6 pg — ABNORMAL LOW (ref 26.0–34.0)
MCHC: 30 g/dL (ref 30.0–36.0)
MCV: 72.2 fL — ABNORMAL LOW (ref 78.0–100.0)
Monocytes Absolute: 0.7 10*3/uL (ref 0.1–1.0)
Neutrophils Relative %: 57 % (ref 43–77)
Platelets: 440 10*3/uL — ABNORMAL HIGH (ref 150–400)
RDW: 15.9 % — ABNORMAL HIGH (ref 11.5–15.5)

## 2012-12-21 LAB — COMPREHENSIVE METABOLIC PANEL
ALT: 20 U/L (ref 0–35)
Albumin: 3.8 g/dL (ref 3.5–5.2)
Alkaline Phosphatase: 71 U/L (ref 39–117)
Calcium: 9.8 mg/dL (ref 8.4–10.5)
GFR calc Af Amer: >90 mL/min (ref >90)
Glucose, Bld: 83 mg/dL (ref 70–99)
Potassium: 4.3 mEq/L (ref 3.5–5.1)
Sodium: 141 mEq/L (ref 135–145)
Total Protein: 7.5 g/dL (ref 6.0–8.3)

## 2012-12-21 LAB — TROPONIN I: Troponin I: <0.3 ng/mL (ref <0.30)

## 2012-12-21 MED ORDER — IBUPROFEN 800 MG PO TABS
800.0000 mg | ORAL_TABLET | Freq: Once | ORAL | Status: AC
  Administered 2012-12-21: 800 mg via ORAL
  Filled 2012-12-21: qty 1

## 2012-12-21 MED ORDER — OMEPRAZOLE 40 MG PO CPDR: 40.0000 mg | DELAYED_RELEASE_CAPSULE | Freq: Every day | ORAL | Status: DC

## 2012-12-21 MED ORDER — PROPRANOLOL HCL 10 MG PO TABS: 10.0000 mg | ORAL_TABLET | Freq: Three times a day (TID) | ORAL | Status: DC

## 2012-12-21 NOTE — Assessment & Plan Note (Addendum)
32 yr old female with CP intermittent x 2 hrs.  EKG performed today notes slight ST elevation in V4 and new Twave inversion in lead III.  Add empiric PPI (omeprazole) due to nausea.  Will refer to ED for formal rule out.  Report given to Efraim Kaufmann, Consulting civil engineer at the [facility] ED.

## 2012-12-21 NOTE — ED Notes (Signed)
Sent from Diane Craze NP at [facility] for further workup as she has been having chest pressure since 1100 this am had ekg upstairs NP reports not acute but did see some changes

## 2012-12-21 NOTE — ED Provider Notes (Signed)
CSN: [id number]     Arrival date & time 12/21/12  1416 History   First MD Initiated Contact with Patient 12/21/12 1452     Chief Complaint  Patient presents with  . Chest Pain    sent from [facility] Craze for further eval   (Consider location/radiation/quality/duration/timing/severity/associated sxs/prior Treatment) HPI Comments: Patient is a 74 her old female past medical history significant for asthma, depression, headaches, allergies, hyperlipidemia, morbid obesity, back pain presented to the emergency department from PCP office for chest tightness. Patient states she had one episode of chest tightness yesterday that resolved with time. She states she redeveloped her chest tightness this morning at 11 AM mention that during her PCP appointment was sent down to the emergency department for further evaluation. Patient does endorse shortness of breath and fatigue but denies any diaphoresis, nausea, vomiting, abdominal pain, unilateral leg swelling. She states she has had these symptoms intermittently fort he last month or so and was talked into coming down to the ED by her PCP at her visit today. Patient endorses no alleviating or aggravating factors for her chest tightness. She states it feels like it is an asthma attack, but then states "I don't really have asthma problems or take my albuterol that often." PERC negative. Patient is currently on her menstrual period. She is actually being followed closely by her Ob/Gyn for ongoing heavy menstrual periods for the last few months and has a follow up appointment tomorrow to discuss lab results and to undergo further investigation for prolonged menstrual cycles, including ultrasounds.   Patient is a 32 y.o. female presenting with chest pain.  Chest Pain Associated symptoms: fatigue and shortness of breath   Associated symptoms: no abdominal pain, no cough, no diaphoresis, no fever, no nausea and not vomiting     Past Medical History  Diagnosis  Date  . Asthma   . Depression   . Headaches, cluster     frequent  . Allergy   . Hyperlipidemia   . Obesity, morbid   . Back pain    Past Surgical History  Procedure Laterality Date  . Appendectomy  2004  . Cholecystectomy  2010  . Tonsillectomy and adenoidectomy  1985   Family History  Problem Relation Age of Onset  . Hypertension Mother   . Diabetes Mother   . Heart disease Mother   . Polycystic ovary syndrome Mother   . Diabetes Father   . Hypertension Father   . Diabetes Sister   . Pancreatitis Sister   . Cancer Maternal Grandmother     ? stomach cancer  . Diabetes Maternal Grandmother   . Cancer Maternal Grandfather     bone  . Glaucoma Maternal Grandfather   . Osteoporosis Maternal Grandfather   . Hypertension Paternal Grandfather   . Mental illness Paternal Grandfather    History  Substance Use Topics  . Smoking status: Never Smoker   . Smokeless tobacco: Never Used  . Alcohol Use: No   OB History   Grav Para Term Preterm Abortions TAB SAB Ect Mult Living                 Review of Systems  Constitutional: Positive for fatigue. Negative for fever, chills and diaphoresis.  Respiratory: Positive for chest tightness and shortness of breath. Negative for cough and wheezing.   Cardiovascular: Positive for chest pain.  Gastrointestinal: Negative for nausea, vomiting and abdominal pain.  All other systems reviewed and are negative.    Allergies  Ace  inhibitors; Cetirizine hcl; Flexeril; Naproxen; and Pineapple  Home Medications   Current Outpatient Rx  Name  Route  Sig  Dispense  Refill  . albuterol (PROVENTIL HFA;VENTOLIN HFA) 108 (90 BASE) MCG/ACT inhaler   Inhalation   Inhale 2 puffs into the lungs every 6 (six) hours as needed for wheezing.   1 Inhaler   0   . fluticasone (FLONASE) 50 MCG/ACT nasal spray   Nasal   Place 2 sprays into the nose daily.   16 g   6   . omeprazole (PRILOSEC) 40 MG capsule   Oral   Take 1 capsule (40 mg total)  by mouth daily.   30 capsule   3   . propranolol (INDERAL) 10 MG tablet   Oral   Take 1 tablet (10 mg total) by mouth 3 (three) times daily.   90 tablet   2   . tiZANidine (ZANAFLEX) 4 MG tablet   Oral   Take 2-4 mg by mouth every 6 (six) hours as needed (Spasms).         . vitamin B-12 (CYANOCOBALAMIN) 1000 MCG tablet   Oral   Take 1,000 mcg by mouth daily.          BP 120/85  Pulse 66  Temp(Src) 97.7 F (36.5 C) (Oral)  Resp 18  Ht 5\' 3"  (1.6 m)  Wt 291 lb (131.997 kg)  BMI 51.56 kg/m2  SpO2 100%  LMP 12/21/2012 Physical Exam  Constitutional: She is oriented to person, place, and time. She appears well-developed and well-nourished. No distress.  HENT:  Head: Normocephalic and atraumatic.  Right Ear: External ear normal.  Left Ear: External ear normal.  Nose: Nose normal.  Mouth/Throat: Oropharynx is clear and moist.  Eyes: Conjunctivae and lids are normal.  Neck: Neck supple.  Cardiovascular: Normal rate, regular rhythm, normal heart sounds and intact distal pulses.   Cap refill < 3 sec  Pulmonary/Chest: Effort normal and breath sounds normal. No respiratory distress. She has no wheezes. She has no rales. She exhibits tenderness.  Abdominal: Soft. Bowel sounds are normal. She exhibits no distension. There is no tenderness. There is no rebound and no guarding.  Musculoskeletal: Normal range of motion. She exhibits no edema.  Lymphadenopathy:    She has no cervical adenopathy.  Neurological: She is alert and oriented to person, place, and time.  Skin: Skin is warm and dry. She is not diaphoretic. No pallor.    ED Course  Procedures (including critical care time) Medications  ibuprofen (ADVIL,MOTRIN) tablet 800 mg (800 mg Oral Given 12/21/12 1526)    Labs Review Labs Reviewed  CBC WITH DIFFERENTIAL - Abnormal; Notable for the following:    Hemoglobin 9.8 (*)    HCT 32.7 (*)    MCV 72.2 (*)    MCH 21.6 (*)    RDW 15.9 (*)    Platelets 440 (*)    All  other components within normal limits  COMPREHENSIVE METABOLIC PANEL - Abnormal; Notable for the following:    GFR calc non Af Amer 82 (*)    All other components within normal limits  TROPONIN I   Imaging Review Dg Chest 2 View  12/21/2012   CLINICAL DATA:  Chest pain  EXAM: CHEST  2 VIEW  COMPARISON:  February 18, 2011  FINDINGS: Lungs are clear. Heart size and pulmonary vascularity are normal. No adenopathy. No pneumothorax. No bone lesions.  IMPRESSION: No abnormality noted.   Electronically Signed   By: Bretta Bang  M.D.   On: 12/21/2012 15:29    EKG Interpretation   None       MDM   1. Anemia   2. Chest tightness   3. Menorrhagia     Afebrile, NAD, non-toxic appearing, AAOx4. Patient is to be discharged with recommendation to follow up with PCP in regards to today's hospital visit. Also advised to keep scheduled Ob/Gyn appointment tomorrow for further evaluation or menorrhagia. Chest pain is not likely of cardiac or pulmonary etiology d/t presentation, perc negative, VSS, no tracheal deviation, no JVD or new murmur, RRR, breath sounds equal bilaterally, EKG without acute abnormalities, negative troponin, and negative CXR. SOB, Fatigue, CP are likely d/t anemia resulting from heavy menstruation has been ongoing. Patient's Hgb, and Hct has dropped from 9 months ago and is being investigated by Ob/Gyn. Do not feel that patient will require ED stay at this time as Hgb > 7 and Hct > 30. Pt has been advised to return to the ED is CP becomes exertional, associated with diaphoresis or nausea, radiates to left jaw/arm, worsens or becomes concerning in any way. Pt appears reliable for follow up and is agreeable to discharge.   Case has been discussed with and seen by Dr. Deretha Emory who agrees with the above plan to discharge.       Jeannetta Ellis, PA-C 12/21/12 1616

## 2012-12-21 NOTE — Patient Instructions (Signed)
Add propranolol 3 x daily to help prevent migraines. Start omeprazole 40mg  once daily for reflux.  Please go directly to the ED on the first floor. Follow up in 1-2 weeks.

## 2012-12-21 NOTE — Assessment & Plan Note (Addendum)
Trial of propranolol for migraine prophylaxis.

## 2012-12-21 NOTE — Progress Notes (Signed)
Subjective:    Patient ID: Diane Patel, female    DOB: Nov 15, 1980, 32 y.o.   MRN: [id number]  Ms. Diane Patel is a 32 yr old female who presents today with CC of chest pain: patient reports intermittent substernal chest heaviness that began this morning around 11:45am. Patient reports tightness to be present at rest, not upon exertion. Denies diaphoresis and pain upon deep inspiration. Patient also reports being under a lot of stress lately. Reports + nausea.  Also reports sensation that her "food comes up into my chest." Cannot associate reflux symptoms with chest pain.    Headache  This is a recurrent problem. The current episode started in the past 7 days. The problem occurs intermittently. The problem has been unchanged. The pain is located in the frontal, temporal and left unilateral region. The pain does not radiate. The pain quality is similar to prior headaches. The quality of the pain is described as aching and sharp. Associated symptoms include dizziness. Pertinent negatives include no coughing, numbness or rhinorrhea. The symptoms are aggravated by bright light. The treatment provided mild relief. Her past medical history is significant for obesity.  patient reports left sided migraine x a couple of weeks. Patient taking BC powder with temporary relief. Patient reports she gets migraines one to two times per week with fatigue, insomnia, nausea, photosensitivity.   Arm tingling: patient reports left hand and arm tingling since this AM. Patient has history of same and reports tingling goes away.   Review of Systems  Constitutional: Positive for chills.       Reporting chills x 1 to 2 weeks  HENT: Negative for rhinorrhea.   Respiratory: Negative for cough and shortness of breath.   Cardiovascular: Positive for chest pain.       See HPI  Genitourinary: Negative for dysuria and frequency.  Neurological: Positive for dizziness and headaches. Negative for numbness.       Reports intermittent  dizziness x several months.  Psychiatric/Behavioral: The patient is nervous/anxious.        Reports decrease in amount of restful sleep.   Past Medical History  Diagnosis Date  . Asthma   . Depression   . Headaches, cluster     frequent  . Allergy   . Hyperlipidemia   . Obesity, morbid   . Back pain     History   Social History  . Marital Status: Married    Spouse Name: N/A    Number of Children: 0  . Years of Education: N/A   Occupational History  . unemployed    Social History Main Topics  . Smoking status: Never Smoker   . Smokeless tobacco: Never Used  . Alcohol Use: No  . Drug Use: No  . Sexual Activity: Not on file   Other Topics Concern  . Not on file   Social History Narrative   Regular excercise    Past Surgical History  Procedure Laterality Date  . Appendectomy  2004  . Cholecystectomy  2010  . Tonsillectomy and adenoidectomy  1985    Family History  Problem Relation Age of Onset  . Hypertension Mother   . Diabetes Mother   . Heart disease Mother   . Polycystic ovary syndrome Mother   . Diabetes Father   . Hypertension Father   . Diabetes Sister   . Pancreatitis Sister   . Cancer Maternal Grandmother     ? stomach cancer  . Diabetes Maternal Grandmother   . Cancer Maternal Grandfather  bone  . Glaucoma Maternal Grandfather   . Osteoporosis Maternal Grandfather   . Hypertension Paternal Grandfather   . Mental illness Paternal Grandfather     Allergies  Allergen Reactions  . Ace Inhibitors     REACTION: angioedema; lips, throat, tounge swelling  . Cetirizine Hcl   . Flexeril [Cyclobenzaprine] Itching  . Naproxen Itching  . Pineapple Hives    Current Outpatient Prescriptions on File Prior to Visit  Medication Sig Dispense Refill  . albuterol (PROVENTIL HFA;VENTOLIN HFA) 108 (90 BASE) MCG/ACT inhaler Inhale 2 puffs into the lungs every 6 (six) hours as needed for wheezing.  1 Inhaler  0  . fluticasone (FLONASE) 50 MCG/ACT  nasal spray Place 2 sprays into the nose daily.  16 g  6  . tiZANidine (ZANAFLEX) 4 MG tablet Take 2-4 mg by mouth every 6 (six) hours as needed (Spasms).       No current facility-administered medications on file prior to visit.    BP 120/80  Pulse 73  Temp(Src) 97.6 F (36.4 C) (Oral)  Resp 16  Wt 291 lb (131.997 kg)  SpO2 98%        Objective:   Physical Exam  Constitutional: She is oriented to person, place, and time. She appears well-nourished.  HENT:  Head: Normocephalic.  Neck: Neck supple. No thyromegaly present.  Cardiovascular: Normal rate, regular rhythm and normal heart sounds.   Pulmonary/Chest: Effort normal and breath sounds normal. No respiratory distress.  Musculoskeletal:  Mild tenderness left dorsal wrist.  No reproducible anterior chest wall tenderness.   Neurological: She is alert and oriented to person, place, and time.  Neg Tinels, neg phalans.   Skin: Skin is warm and dry.  Psychiatric: She has a normal mood and affect.          Assessment & Plan:

## 2012-12-21 NOTE — Assessment & Plan Note (Signed)
Add omeprazole.  D/C BC powder

## 2012-12-22 ENCOUNTER — Other Ambulatory Visit: Payer: Self-pay | Admitting: Obstetrics and Gynecology

## 2012-12-22 ENCOUNTER — Ambulatory Visit (INDEPENDENT_AMBULATORY_CARE_PROVIDER_SITE_OTHER): Payer: Managed Care, Other (non HMO) | Admitting: Family

## 2012-12-22 ENCOUNTER — Encounter: Payer: Self-pay | Admitting: Family

## 2012-12-22 VITALS — BP 116/78 | HR 93 | Temp 98.0°F | Resp 16 | Ht 62.5 in | Wt 291.2 lb

## 2012-12-22 DIAGNOSIS — Z23 Encounter for immunization: Secondary | ICD-10-CM

## 2012-12-22 DIAGNOSIS — Z13220 Encounter for screening for lipoid disorders: Secondary | ICD-10-CM | POA: Insufficient documentation

## 2012-12-22 DIAGNOSIS — Z0000 Encounter for general adult medical examination without abnormal findings: Secondary | ICD-10-CM

## 2012-12-22 DIAGNOSIS — R0789 Other chest pain: Secondary | ICD-10-CM

## 2012-12-22 DIAGNOSIS — D649 Anemia, unspecified: Secondary | ICD-10-CM

## 2012-12-22 LAB — IRON AND TIBC
%SAT: 5 % — ABNORMAL LOW (ref 20–55)
TIBC: 355 ug/dL (ref 250–470)
UIBC: 339 ug/dL (ref 125–400)

## 2012-12-22 NOTE — Assessment & Plan Note (Signed)
Check iron studies, microcytic so expect it will be low. Add iron 325mg  bid.

## 2012-12-22 NOTE — Patient Instructions (Addendum)
Complete lab work prior to leaving. Start counting calories using my fitness pal. Try to keep calories at <1500 a day.   Please try to exercise 30 minutes 5 days a week. Start iron supplement. Follow up in 6-8 weeks.

## 2012-12-22 NOTE — Progress Notes (Signed)
Subjective:    Patient ID: Diane Patel, female    DOB: 1980-04-29, 32 y.o.   MRN: [id number]  HPI  Diane Patel is a 32 yr old female who presents today for cpx.  Patient presents today for complete physical.  Immunizations: flu shot up to date. Tetanus ? Diet: improving diet.   Exercise: has gained 11 pounds since last year.  Wants start a couch to 5K. Pap Smear: 12/10/12 She brings with her today her Wellness labs from her employer.  LDL 119.  Glucose 91.     Wt Readings from Last 3 Encounters:  12/22/12 291 lb 4 oz (132.11 kg)  12/21/12 291 lb (131.997 kg)  12/21/12 291 lb (131.997 kg)    She was sent to the ED yesterday for chest tightness. Records are reviewed. Lab work included a CBC which noted new anemia with a hemoglobin of 9.8.  Tryponin was negative. CXR is negative. She reports taht today she is scheduled for a sonohystogram. Reports heavy bleeding for "a long time."  Reports that she has started "fixing my meals instead of going out."  She reports that GYN is checking thyroid- she is awaiting results.  Reports that she has been bleeding since July of this year.  Reports poor energy levels since September.     Review of Systems  Constitutional: Positive for fatigue and unexpected weight change.  HENT: Negative for rhinorrhea.   Eyes:       Reports that she is up to date on eye exam- has floaters.    Respiratory:       Mild cough  Cardiovascular:       Denies current chest pain  Gastrointestinal:       Recent bloating with dairy- improved with elimation of dairy  Musculoskeletal:       Knee/back pain  Neurological:       Mild HA this AM   Past Medical History  Diagnosis Date  . Asthma   . Depression   . Headaches, cluster     frequent  . Allergy   . Hyperlipidemia   . Obesity, morbid   . Back pain     History   Social History  . Marital Status: Married    Spouse Name: N/A    Number of Children: 0  . Years of Education: N/A   Occupational History   . unemployed    Social History Main Topics  . Smoking status: Never Smoker   . Smokeless tobacco: Never Used  . Alcohol Use: No  . Drug Use: No  . Sexual Activity: Not on file   Other Topics Concern  . Not on file   Social History Narrative   Regular excercise    Past Surgical History  Procedure Laterality Date  . Appendectomy  2004  . Cholecystectomy  2010  . Tonsillectomy and adenoidectomy  1985    Family History  Problem Relation Age of Onset  . Hypertension Mother   . Diabetes Mother   . Heart disease Mother   . Polycystic ovary syndrome Mother   . Diabetes Father   . Hypertension Father   . Diabetes Sister   . Pancreatitis Sister   . Cancer Maternal Grandmother     ? stomach cancer  . Diabetes Maternal Grandmother   . Cancer Maternal Grandfather     bone  . Glaucoma Maternal Grandfather   . Osteoporosis Maternal Grandfather   . Hypertension Paternal Grandfather   . Mental illness Paternal Grandfather  Allergies  Allergen Reactions  . Ace Inhibitors     REACTION: angioedema; lips, throat, tounge swelling  . Cetirizine Hcl   . Flexeril [Cyclobenzaprine] Itching  . Naproxen Itching  . Pineapple Hives    Current Outpatient Prescriptions on File Prior to Visit  Medication Sig Dispense Refill  . albuterol (PROVENTIL HFA;VENTOLIN HFA) 108 (90 BASE) MCG/ACT inhaler Inhale 2 puffs into the lungs every 6 (six) hours as needed for wheezing.  1 Inhaler  0  . fluticasone (FLONASE) 50 MCG/ACT nasal spray Place 2 sprays into the nose daily.  16 g  6  . omeprazole (PRILOSEC) 40 MG capsule Take 1 capsule (40 mg total) by mouth daily.  30 capsule  3  . propranolol (INDERAL) 10 MG tablet Take 1 tablet (10 mg total) by mouth 3 (three) times daily.  90 tablet  2  . tiZANidine (ZANAFLEX) 4 MG tablet Take 2-4 mg by mouth every 6 (six) hours as needed (Spasms).      . vitamin B-12 (CYANOCOBALAMIN) 1000 MCG tablet Take 1,000 mcg by mouth daily.       No current  facility-administered medications on file prior to visit.    BP 116/78  Pulse 93  Temp(Src) 98 F (36.7 C) (Oral)  Resp 16  Ht 5' 2.5" (1.588 m)  Wt 291 lb 4 oz (132.11 kg)  BMI 52.39 kg/m2  SpO2 97%  LMP 08/04/2012       Objective:   Physical Exam  Physical Exam  Constitutional: She is oriented to person, place, and time. She appears well-developed, morbidly obese and well-nourished. No distress.  HENT:  Head: Normocephalic and atraumatic.  Right Ear: Tympanic membrane and ear canal normal.  Left Ear: Tympanic membrane and ear canal normal.  Mouth/Throat: Oropharynx is clear and moist.  Eyes: Pupils are equal, round, and reactive to light. No scleral icterus.  Neck: Normal range of motion. No thyromegaly present.  Cardiovascular: Normal rate and regular rhythm.   No murmur heard. Pulmonary/Chest: Effort normal and breath sounds normal. No respiratory distress. He has no wheezes. She has no rales. She exhibits no tenderness.  Abdominal: Soft. Bowel sounds are normal. He exhibits no distension and no mass. There is no tenderness. There is no rebound and no guarding.  Musculoskeletal: She exhibits no edema.  Lymphadenopathy:    She has no cervical adenopathy.  Neurological: She is alert and oriented to person, place, and time. She has normal patellar reflexes. She exhibits normal muscle tone. Coordination normal.  Skin: Skin is warm and dry.  Psychiatric: She has a normal mood and affect. Her behavior is normal. Judgment and thought content normal.  Breast/pelvic: deferred to GYN.     Assessment & Plan:         Assessment & Plan:

## 2012-12-22 NOTE — Assessment & Plan Note (Signed)
Discussed diet, exercise, weight loss.  She will have GYN fax TSH to [place].  Obtain urinalysis, other labs up to date.  Tetanus today.  Flu shot up to date.

## 2012-12-22 NOTE — ED Provider Notes (Signed)
Medical screening examination/treatment/procedure(s) were performed by non-physician practitioner and as supervising physician I was immediately available for consultation/collaboration.  EKG Interpretation   None         Shelda Jakes, MD 12/22/12 [phone]

## 2012-12-22 NOTE — Progress Notes (Signed)
Pre visit review using our clinic review tool, if applicable. No additional management support is needed unless otherwise documented below in the visit note/SLS  

## 2012-12-22 NOTE — Assessment & Plan Note (Signed)
Resolved.  Likely GI- start PPI.

## 2012-12-23 ENCOUNTER — Telehealth: Payer: Self-pay | Admitting: Family

## 2012-12-23 LAB — URINALYSIS, ROUTINE W REFLEX MICROSCOPIC
Bilirubin Urine: NEGATIVE
Ketones, ur: NEGATIVE mg/dL
Nitrite: NEGATIVE
Protein, ur: NEGATIVE mg/dL
Specific Gravity, Urine: 1.024 (ref 1.005–1.030)
Urobilinogen, UA: 0.2 mg/dL (ref 0.0–1.0)
pH: 5.5 (ref 5.0–8.0)

## 2012-12-23 LAB — URINALYSIS, MICROSCOPIC ONLY
Bacteria, UA: NONE SEEN
Crystals: NONE SEEN
Squamous Epithelial / LPF: NONE SEEN

## 2012-12-23 NOTE — Telephone Encounter (Signed)
Left detailed message on home# and to call if any questions. 

## 2012-12-23 NOTE — Telephone Encounter (Signed)
Blood work confirms iron deficiency. Start iron supplement as we discussed at visit.

## 2013-02-09 ENCOUNTER — Ambulatory Visit (INDEPENDENT_AMBULATORY_CARE_PROVIDER_SITE_OTHER): Payer: Managed Care, Other (non HMO) | Admitting: Family

## 2013-02-09 ENCOUNTER — Encounter: Payer: Self-pay | Admitting: Family

## 2013-02-09 VITALS — BP 118/84 | HR 89 | Temp 97.9°F | Resp 16 | Ht 62.5 in | Wt 296.0 lb

## 2013-02-09 DIAGNOSIS — J329 Chronic sinusitis, unspecified: Secondary | ICD-10-CM

## 2013-02-09 DIAGNOSIS — F3289 Other specified depressive episodes: Secondary | ICD-10-CM

## 2013-02-09 DIAGNOSIS — D649 Anemia, unspecified: Secondary | ICD-10-CM

## 2013-02-09 DIAGNOSIS — R5381 Other malaise: Secondary | ICD-10-CM

## 2013-02-09 DIAGNOSIS — F329 Major depressive disorder, single episode, unspecified: Secondary | ICD-10-CM

## 2013-02-09 DIAGNOSIS — R5383 Other fatigue: Secondary | ICD-10-CM

## 2013-02-09 LAB — CBC WITH DIFFERENTIAL/PLATELET
BASOS ABS: 0.1 10*3/uL (ref 0.0–0.1)
BASOS PCT: 1 % (ref 0–1)
EOS ABS: 0.1 10*3/uL (ref 0.0–0.7)
EOS PCT: 2 % (ref 0–5)
HCT: 33.5 % — ABNORMAL LOW (ref 36.0–46.0)
Hemoglobin: 10.6 g/dL — ABNORMAL LOW (ref 12.0–15.0)
Lymphocytes Relative: 33 % (ref 12–46)
Lymphs Abs: 2.1 10*3/uL (ref 0.7–4.0)
MCH: 23.2 pg — ABNORMAL LOW (ref 26.0–34.0)
MCHC: 31.6 g/dL (ref 30.0–36.0)
MCV: 73.3 fL — AB (ref 78.0–100.0)
Monocytes Absolute: 0.6 10*3/uL (ref 0.1–1.0)
Monocytes Relative: 10 % (ref 3–12)
Neutro Abs: 3.5 10*3/uL (ref 1.7–7.7)
Neutrophils Relative %: 54 % (ref 43–77)
PLATELETS: 404 10*3/uL — AB (ref 150–400)
RBC: 4.57 MIL/uL (ref 3.87–5.11)
RDW: 18.6 % — AB (ref 11.5–15.5)
WBC: 6.5 10*3/uL (ref 4.0–10.5)

## 2013-02-09 MED ORDER — AMOXICILLIN 500 MG PO CAPS: 500.0000 mg | ORAL_CAPSULE | Freq: Three times a day (TID) | ORAL | Status: DC

## 2013-02-09 NOTE — Patient Instructions (Signed)
Please complete your lab work prior to leaving. You will be contacted about your sleep study. Call if sinus symptoms worsen, or if not improved in 2-3 days. Please schedule a follow up appointment in 3 months.

## 2013-02-09 NOTE — Progress Notes (Signed)
Subjective:    Patient ID: Diane Patel, female    DOB: 05-Oct-1980, 33 y.o.   MRN: [id number]  HPI  Diane Patel is a 33 yr old female who presents today for follow up and to address sore throat.  1) anemia-she was started on iron supplement in November and also takes an oral B12 supplement. She reports that she still feels fatigued.  She reports constipation.  Husband notes + snoring, with pauses. Pt wakes up unrested.       Component Value Date/Time   WBC 8.1 12/21/2012 0245   RBC 4.53 12/21/2012 0245   HGB 9.8* 12/21/2012 0245   HCT 32.7* 12/21/2012 0245   PLT 440* 12/21/2012 0245   MCV 72.2* 12/21/2012 0245   MCH 21.6* 12/21/2012 0245   MCHC 30.0 12/21/2012 0245   RDW 15.9* 12/21/2012 0245   LYMPHSABS 2.5 12/21/2012 0245   MONOABS 0.7 12/21/2012 0245   EOSABS 0.2 12/21/2012 0245   BASOSABS 0.0 12/21/2012 0245    2) Depression- reports that her mood has been up and down since her last appointment.  Lost a good friend in December.   3) Sore throat- reports 1 week hx of sore throat, drainage.  Had headache all day yesterday.  Some hoarseness.  She reports fever over the weekend (subjective). Symptoms are worsening.    Review of Systems See HPI  Past Medical History  Diagnosis Date  . Asthma   . Depression   . Headaches, cluster     frequent  . Allergy   . Hyperlipidemia   . Obesity, morbid   . Back pain     History   Social History  . Marital Status: Married    Spouse Name: N/A    Number of Children: 0  . Years of Education: N/A   Occupational History  . unemployed    Social History Main Topics  . Smoking status: Never Smoker   . Smokeless tobacco: Never Used  . Alcohol Use: No  . Drug Use: No  . Sexual Activity: Not on file   Other Topics Concern  . Not on file   Social History Narrative   Regular excercise    Past Surgical History  Procedure Laterality Date  . Appendectomy  2004  . Cholecystectomy  2010  . Tonsillectomy and adenoidectomy   1985    Family History  Problem Relation Age of Onset  . Hypertension Mother   . Diabetes Mother   . Heart disease Mother   . Polycystic ovary syndrome Mother   . Diabetes Father   . Hypertension Father   . Diabetes Sister   . Pancreatitis Sister   . Cancer Maternal Grandmother     ? stomach cancer  . Diabetes Maternal Grandmother   . Cancer Maternal Grandfather     bone  . Glaucoma Maternal Grandfather   . Osteoporosis Maternal Grandfather   . Hypertension Paternal Grandfather   . Mental illness Paternal Grandfather     Allergies  Allergen Reactions  . Ace Inhibitors     REACTION: angioedema; lips, throat, tounge swelling  . Cetirizine Hcl   . Flexeril [Cyclobenzaprine] Itching  . Naproxen Itching  . Pineapple Hives    Current Outpatient Prescriptions on File Prior to Visit  Medication Sig Dispense Refill  . albuterol (PROVENTIL HFA;VENTOLIN HFA) 108 (90 BASE) MCG/ACT inhaler Inhale 2 puffs into the lungs every 6 (six) hours as needed for wheezing.  1 Inhaler  0  . ferrous sulfate 325 (65 FE) MG  tablet Take 325 mg by mouth 2 (two) times daily with a meal.      . fluticasone (FLONASE) 50 MCG/ACT nasal spray Place 2 sprays into the nose daily.  16 g  6  . omeprazole (PRILOSEC) 40 MG capsule Take 1 capsule (40 mg total) by mouth daily.  30 capsule  3  . propranolol (INDERAL) 10 MG tablet Take 1 tablet (10 mg total) by mouth 3 (three) times daily.  90 tablet  2  . tiZANidine (ZANAFLEX) 4 MG tablet Take 2-4 mg by mouth every 6 (six) hours as needed (Spasms).      . vitamin B-12 (CYANOCOBALAMIN) 1000 MCG tablet Take 1,000 mcg by mouth daily.       No current facility-administered medications on file prior to visit.    BP 118/84  Pulse 89  Temp(Src) 97.9 F (36.6 C) (Oral)  Resp 16  Ht 5' 2.5" (1.588 m)  Wt 296 lb (134.265 kg)  BMI 53.24 kg/m2  SpO2 98%       Objective:   Physical Exam  Constitutional: She is oriented to person, place, and time. She appears  well-developed and well-nourished.  HENT:  Head: Normocephalic and atraumatic.  Mouth/Throat: No oropharyngeal exudate, posterior oropharyngeal edema or posterior oropharyngeal erythema.  + maxillary sinus tenderness bilaterally  Cardiovascular: Normal rate and regular rhythm.   No murmur heard. Pulmonary/Chest: Effort normal and breath sounds normal. No respiratory distress. She has no wheezes. She has no rales. She exhibits no tenderness.  Lymphadenopathy:    She has no cervical adenopathy.  Neurological: She is alert and oriented to person, place, and time.  Psychiatric: She has a normal mood and affect. Her behavior is normal. Judgment and thought content normal.          Assessment & Plan:

## 2013-02-09 NOTE — Progress Notes (Signed)
Pre visit review using our clinic review tool, if applicable. No additional management support is needed unless otherwise documented below in the visit note. 

## 2013-02-09 NOTE — Assessment & Plan Note (Signed)
Will rx with amoxcillin.

## 2013-02-09 NOTE — Assessment & Plan Note (Signed)
Check follow up iron level, cbc.

## 2013-02-09 NOTE — Assessment & Plan Note (Signed)
?   Sleep apnea.  Will refer for sleep study.

## 2013-02-09 NOTE — Assessment & Plan Note (Signed)
Some situational stress, overall doing well.

## 2013-05-14 ENCOUNTER — Telehealth: Payer: Self-pay

## 2013-05-14 NOTE — Telephone Encounter (Signed)
I contacted the patient back in January and she was having issues with her arranging a time between her work schedule to pick up device. She stated at that time that she would call back to at some point to schedule.  I had called patient this am and LMOAM for her to return my call at [phone] to try to arrange to get this study done. Pt has to be at work at 9:30 am and works until 7:30 pm.  I advised patient that she can come in the am to pick up the device around 8:30 am to p/u device. This will work for the patient and HST has been scheduled for Thurs 05/20/13. Rhonda J Cobb

## 2013-05-14 NOTE — Telephone Encounter (Signed)
It looks like Dr. Inda Castle order home sleep study in January 2015. Please advise PCC's thanks

## 2013-06-21 ENCOUNTER — Encounter: Payer: Self-pay | Admitting: Family

## 2013-06-21 ENCOUNTER — Ambulatory Visit (INDEPENDENT_AMBULATORY_CARE_PROVIDER_SITE_OTHER): Payer: Managed Care, Other (non HMO) | Admitting: Family

## 2013-06-21 VITALS — BP 118/90 | HR 104 | Temp 98.0°F | Resp 16 | Ht 62.5 in | Wt 298.0 lb

## 2013-06-21 DIAGNOSIS — J329 Chronic sinusitis, unspecified: Secondary | ICD-10-CM

## 2013-06-21 DIAGNOSIS — H0520 Unspecified exophthalmos: Secondary | ICD-10-CM

## 2013-06-21 DIAGNOSIS — R609 Edema, unspecified: Secondary | ICD-10-CM

## 2013-06-21 DIAGNOSIS — M79609 Pain in unspecified limb: Secondary | ICD-10-CM

## 2013-06-21 DIAGNOSIS — M79602 Pain in left arm: Secondary | ICD-10-CM

## 2013-06-21 MED ORDER — FUROSEMIDE 20 MG PO TABS: 20.0000 mg | ORAL_TABLET | Freq: Every day | ORAL | Status: DC | PRN

## 2013-06-21 MED ORDER — AMOXICILLIN 500 MG PO CAPS: 500.0000 mg | ORAL_CAPSULE | Freq: Three times a day (TID) | ORAL | Status: DC

## 2013-06-21 NOTE — Assessment & Plan Note (Signed)
Rx with amoxicillin.

## 2013-06-21 NOTE — Assessment & Plan Note (Signed)
Add lasix 20mg  once daily prn. Obtain 2d echo, pt to reschedule sleep study.

## 2013-06-21 NOTE — Assessment & Plan Note (Signed)
Not prominent on exam today. Will arrange close follow up with opthalmalogist. Check TFT's.

## 2013-06-21 NOTE — Assessment & Plan Note (Signed)
Likely musculoskeletal. Allergic to naproxen.  Recommended tylenol prn.

## 2013-06-21 NOTE — Progress Notes (Signed)
Subjective:    Patient ID: Diane Patel, female    DOB: 20-Jun-1980, 33 y.o.   MRN: [id number]  HPI  Diane Patel is a 33 yr old female who presents today for follow up of multiple medical problems.   1) Facial pain- reports associated nasal/ear congestion, headaches.  Not improved by use of allegra or tylenol sinus.Symptoms started 2 weeks ago.  She reports that her headaches haes also worsened.    2) Arm pain-  Reports L arm pain and swelling x 3 weeks. Pain shoots down the left arm.    3) Edema- reports swelling of the right hand x 1 week. She denies sob.  She does have some coughing.     4) Reports that last week she awoke from sleep and noted her right eyeball "had popped out." Pt reduced the eyeball herself. Reports some mild "floaters." otherwise no visual deficit.     Review of Systems See HPI  Past Medical History  Diagnosis Date  . Asthma   . Depression   . Headaches, cluster     frequent  . Allergy   . Hyperlipidemia   . Obesity, morbid   . Back pain     History   Social History  . Marital Status: Married    Spouse Name: N/A    Number of Children: 0  . Years of Education: N/A   Occupational History  . unemployed    Social History Main Topics  . Smoking status: Never Smoker   . Smokeless tobacco: Never Used  . Alcohol Use: No  . Drug Use: No  . Sexual Activity: Not on file   Other Topics Concern  . Not on file   Social History Narrative   Regular excercise    Past Surgical History  Procedure Laterality Date  . Appendectomy  2004  . Cholecystectomy  2010  . Tonsillectomy and adenoidectomy  1985    Family History  Problem Relation Age of Onset  . Hypertension Mother   . Diabetes Mother   . Heart disease Mother   . Polycystic ovary syndrome Mother   . Diabetes Father   . Hypertension Father   . Diabetes Sister   . Pancreatitis Sister   . Cancer Maternal Grandmother     ? stomach cancer  . Diabetes Maternal Grandmother   . Cancer  Maternal Grandfather     bone  . Glaucoma Maternal Grandfather   . Osteoporosis Maternal Grandfather   . Hypertension Paternal Grandfather   . Mental illness Paternal Grandfather     Allergies  Allergen Reactions  . Ace Inhibitors     REACTION: angioedema; lips, throat, tounge swelling  . Cetirizine Hcl   . Flexeril [Cyclobenzaprine] Itching  . Naproxen Itching  . Pineapple Hives    Current Outpatient Prescriptions on File Prior to Visit  Medication Sig Dispense Refill  . ALAYCEN 1/35 tablet Take 1 tablet by mouth daily.      Marland Kitchen albuterol (PROVENTIL HFA;VENTOLIN HFA) 108 (90 BASE) MCG/ACT inhaler Inhale 2 puffs into the lungs every 6 (six) hours as needed for wheezing.  1 Inhaler  0  . amoxicillin (AMOXIL) 500 MG capsule Take 1 capsule (500 mg total) by mouth 3 (three) times daily.  30 capsule  0  . ferrous sulfate 325 (65 FE) MG tablet Take 325 mg by mouth 2 (two) times daily with a meal.      . fluticasone (FLONASE) 50 MCG/ACT nasal spray Place 2 sprays into the nose daily.  16 g  6  . omeprazole (PRILOSEC) 40 MG capsule Take 1 capsule (40 mg total) by mouth daily.  30 capsule  3  . propranolol (INDERAL) 10 MG tablet Take 1 tablet (10 mg total) by mouth 3 (three) times daily.  90 tablet  2  . tiZANidine (ZANAFLEX) 4 MG tablet Take 2-4 mg by mouth every 6 (six) hours as needed (Spasms).      . vitamin B-12 (CYANOCOBALAMIN) 1000 MCG tablet Take 1,000 mcg by mouth daily.       No current facility-administered medications on file prior to visit.    BP 118/90  Pulse 104  Temp(Src) 98 F (36.7 C) (Oral)  Resp 16  Ht 5' 2.5" (1.588 m)  Wt 298 lb (135.172 kg)  BMI 53.60 kg/m2  SpO2 99%       Objective:   Physical Exam  Constitutional: She is oriented to person, place, and time. She appears well-developed and well-nourished.  HENT:  Head: Normocephalic and atraumatic.  Right Ear: Tympanic membrane and ear canal normal.  Left Ear: Tympanic membrane and ear canal normal.  +  maxillary sinus tenderness to palpation.   Eyes: Conjunctivae are normal. Right eye exhibits no discharge. Left eye exhibits no discharge. No scleral icterus.  Mild exophthalmos noted bilaterally.   Cardiovascular: Normal rate and regular rhythm.   No murmur heard. Pulmonary/Chest: Effort normal and breath sounds normal. No respiratory distress. She has no wheezes. She has no rales. She exhibits no tenderness.  Musculoskeletal:  1-2+ bilateral leg swelling is noted. Bilateral hand swelling is noted. Mild tenderness to palpation, left upper arm. No significant arm swelling is noted.   Neurological: She is alert and oriented to person, place, and time.  Skin: Skin is warm and dry.  Psychiatric: She has a normal mood and affect. Her behavior is normal. Thought content normal.          Assessment & Plan:

## 2013-06-21 NOTE — Patient Instructions (Addendum)
Start lasix one tablet by mouth daily as needed for swelling. You will be contacted about your referral to the eye doctor and for echocardiogram. Use tylenol as needed for left arm pain. Start amoxicillin for sinusitis. Follow up in 2 weeks.

## 2013-06-21 NOTE — Progress Notes (Signed)
Pre visit review using our clinic review tool, if applicable. No additional management support is needed unless otherwise documented below in the visit note. 

## 2013-06-22 ENCOUNTER — Telehealth: Payer: Self-pay | Admitting: *Deleted

## 2013-06-22 DIAGNOSIS — G4733 Obstructive sleep apnea (adult) (pediatric): Secondary | ICD-10-CM

## 2013-06-22 LAB — T4, FREE: Free T4: 1.13 ng/dL (ref 0.80–1.80)

## 2013-06-22 LAB — TSH: TSH: 1.767 u[IU]/mL (ref 0.350–4.500)

## 2013-06-22 LAB — T3, FREE: T3 FREE: 3.1 pg/mL (ref 2.3–4.2)

## 2013-06-22 NOTE — Telephone Encounter (Signed)
Received call from Dr Phineas Real to let us know that pt's eye exam was unremarkable. Pt told her we were doing testing for pt's thyroid so she will leave it up to [place] going forward. She thinks pt's symptoms could be from sinusitis or thyroid condition. Did feel need to order imaging at this time. States she also faxed a letter to [place] with these findings.

## 2013-06-23 ENCOUNTER — Encounter: Payer: Self-pay | Admitting: Family

## 2013-06-23 DIAGNOSIS — G4733 Obstructive sleep apnea (adult) (pediatric): Secondary | ICD-10-CM

## 2013-06-23 NOTE — Telephone Encounter (Signed)
Confirmed with tricia that Dr. Phineas Real did NOT feel need for additional imaging at this time. Await faxed consult.

## 2013-06-24 ENCOUNTER — Telehealth: Payer: Self-pay | Admitting: Family

## 2013-06-24 ENCOUNTER — Encounter: Payer: Self-pay | Admitting: Family

## 2013-06-24 DIAGNOSIS — G4733 Obstructive sleep apnea (adult) (pediatric): Secondary | ICD-10-CM

## 2013-06-24 NOTE — Telephone Encounter (Signed)
Please contact pt and advise her that her sleep study shows moderate sleep apnea. Her oxygen drops very low during sleep.  I would like her to complete a CPAP titration study in the sleep lab so they can optimize setting for her.

## 2013-06-25 ENCOUNTER — Telehealth: Payer: Self-pay | Admitting: *Deleted

## 2013-06-25 NOTE — Telephone Encounter (Signed)
Notified pt and she voices understanding. 

## 2013-06-25 NOTE — Telephone Encounter (Signed)
Received call from pt stating she dropped off FMLA papers yesterday and employer wants them returned by 06/30/13. Pt would like form to cover dates: 5/18, 5/19 for appts 5/20 did not work as she was still fatigued from her sleep study. Will need them to also include 5/27 and 6/4 for upcoming appts. Pt states she returned to work on 06/24/13.  Pt would like the forms to state she is undergoing cardiac testing. Forms forwarded to Provider for completion.

## 2013-06-30 ENCOUNTER — Telehealth: Payer: Self-pay | Admitting: *Deleted

## 2013-06-30 ENCOUNTER — Ambulatory Visit (HOSPITAL_BASED_OUTPATIENT_CLINIC_OR_DEPARTMENT_OTHER)
Admission: RE | Admit: 2013-06-30 | Discharge: 2013-06-30 | Disposition: A | Discharge status: Home / Self Care | Payer: Managed Care, Other (non HMO) | Source: Ambulatory Visit | Attending: Family | Admitting: Family

## 2013-06-30 DIAGNOSIS — I517 Cardiomegaly: Secondary | ICD-10-CM

## 2013-06-30 DIAGNOSIS — R609 Edema, unspecified: Secondary | ICD-10-CM | POA: Insufficient documentation

## 2013-06-30 DIAGNOSIS — G43909 Migraine, unspecified, not intractable, without status migrainosus: Secondary | ICD-10-CM | POA: Insufficient documentation

## 2013-06-30 DIAGNOSIS — E282 Polycystic ovarian syndrome: Secondary | ICD-10-CM | POA: Insufficient documentation

## 2013-06-30 DIAGNOSIS — R5381 Other malaise: Secondary | ICD-10-CM | POA: Insufficient documentation

## 2013-06-30 DIAGNOSIS — F329 Major depressive disorder, single episode, unspecified: Secondary | ICD-10-CM | POA: Insufficient documentation

## 2013-06-30 DIAGNOSIS — F3289 Other specified depressive episodes: Secondary | ICD-10-CM | POA: Insufficient documentation

## 2013-06-30 DIAGNOSIS — M79609 Pain in unspecified limb: Secondary | ICD-10-CM | POA: Insufficient documentation

## 2013-06-30 DIAGNOSIS — J45909 Unspecified asthma, uncomplicated: Secondary | ICD-10-CM | POA: Insufficient documentation

## 2013-06-30 DIAGNOSIS — R5383 Other fatigue: Secondary | ICD-10-CM

## 2013-06-30 DIAGNOSIS — K219 Gastro-esophageal reflux disease without esophagitis: Secondary | ICD-10-CM | POA: Insufficient documentation

## 2013-06-30 NOTE — Telephone Encounter (Signed)
Pt came in to the office to pick up FMLA papers. Advised her of below recommendation. She will have correct papers faxed to [place] and also requests that we complete the set that we have for her husband as he transports her to her appts? She will arrange follow up for re-evaluation in the office. Please advise.

## 2013-06-30 NOTE — Telephone Encounter (Signed)
I completed family member paperwork for her husband.

## 2013-06-30 NOTE — Telephone Encounter (Signed)
Notified pt. She states she will pick up spousal FMLA at her f/u on Friday. Awaiting FMLA for patient.

## 2013-06-30 NOTE — Telephone Encounter (Signed)
Left message for pt to return my call.

## 2013-06-30 NOTE — Telephone Encounter (Signed)
Pt will need follow up in office please for re-evaluation.  If I do not have openings, then please schedule with another provider.  I worked on [employer] today, but it appears her employer provided [place] with the Family Member's Serious Health condition form, not the form for the patient's condition.  She should request that they provide proper form and I will fill.

## 2013-06-30 NOTE — Progress Notes (Signed)
  Echocardiogram 2D Echocardiogram has been performed.  Diane Patel 06/30/2013, 11:42 AM

## 2013-06-30 NOTE — Telephone Encounter (Signed)
Received message from pt's spouse that pt continues to have headaches, pressure in face and bilateral ear pain. She is requesting to have different antibiotic (z pack)?

## 2013-07-02 ENCOUNTER — Encounter: Payer: Self-pay | Admitting: Family

## 2013-07-02 ENCOUNTER — Ambulatory Visit (INDEPENDENT_AMBULATORY_CARE_PROVIDER_SITE_OTHER): Payer: Managed Care, Other (non HMO) | Admitting: Family

## 2013-07-02 VITALS — BP 100/78 | HR 88 | Temp 98.1°F | Resp 18 | Ht 62.5 in | Wt 299.1 lb

## 2013-07-02 DIAGNOSIS — J329 Chronic sinusitis, unspecified: Secondary | ICD-10-CM

## 2013-07-02 MED ORDER — AMOXICILLIN-POT CLAVULANATE 600-42.9 MG/5ML PO SUSR: 7.0000 mL | Freq: Two times a day (BID) | ORAL | Status: DC

## 2013-07-02 MED ORDER — AMOXICILLIN-POT CLAVULANATE 875-125 MG PO TABS: 1.0000 | ORAL_TABLET | Freq: Two times a day (BID) | ORAL | Status: DC

## 2013-07-02 NOTE — Progress Notes (Addendum)
Subjective:    Patient ID: Diane Patel, female    DOB: 02-May-1980, 33 y.o.   MRN: [id number]  HPI Mrs. Pinkhasov complains of hoarseness, sneezing, congestion, coughing that has continued since her 5/18 visit. She started having shortness of breath yesterday x 3 after coughing episodes.  Cough productive of yellow "chunks". Has frontal and maxillary sinus pain that comes and goes.She feels "hot" at times but has not taken her temperature. She is currently taking amoxicillin prescribed 5/18 and still has some left because she has missed some doses.  Not currently taking antihistamine or flonase.    Review of Systems  Constitutional: Positive for chills, activity change and fatigue.  HENT: Positive for congestion, ear pain, rhinorrhea, sinus pressure and sneezing. Negative for ear discharge and sore throat.   Eyes: Positive for itching. Negative for discharge.  Respiratory: Positive for cough and shortness of breath.   Musculoskeletal: Negative for arthralgias.   Past Medical History  Diagnosis Date  . Asthma   . Depression   . Headaches, cluster     frequent  . Allergy   . Hyperlipidemia   . Obesity, morbid   . Back pain     History   Social History  . Marital Status: Married    Spouse Name: N/A    Number of Children: 0  . Years of Education: N/A   Occupational History  . unemployed    Social History Main Topics  . Smoking status: Never Smoker   . Smokeless tobacco: Never Used  . Alcohol Use: No  . Drug Use: No  . Sexual Activity: Not on file   Other Topics Concern  . Not on file   Social History Narrative   Regular excercise    Past Surgical History  Procedure Laterality Date  . Appendectomy  2004  . Cholecystectomy  2010  . Tonsillectomy and adenoidectomy  1985    Family History  Problem Relation Age of Onset  . Hypertension Mother   . Diabetes Mother   . Heart disease Mother   . Polycystic ovary syndrome Mother   . Diabetes Father   . Hypertension  Father   . Diabetes Sister   . Pancreatitis Sister   . Cancer Maternal Grandmother     ? stomach cancer  . Diabetes Maternal Grandmother   . Cancer Maternal Grandfather     bone  . Glaucoma Maternal Grandfather   . Osteoporosis Maternal Grandfather   . Hypertension Paternal Grandfather   . Mental illness Paternal Grandfather     Allergies  Allergen Reactions  . Ace Inhibitors     REACTION: angioedema; lips, throat, tounge swelling  . Cetirizine Hcl   . Flexeril [Cyclobenzaprine] Itching  . Naproxen Itching  . Pineapple Hives    Current Outpatient Prescriptions on File Prior to Visit  Medication Sig Dispense Refill  . albuterol (PROVENTIL HFA;VENTOLIN HFA) 108 (90 BASE) MCG/ACT inhaler Inhale 2 puffs into the lungs every 6 (six) hours as needed for wheezing.  1 Inhaler  0  . ferrous sulfate 325 (65 FE) MG tablet Take 325 mg by mouth 2 (two) times daily with a meal.      . fluticasone (FLONASE) 50 MCG/ACT nasal spray Place 2 sprays into the nose daily.  16 g  6  . furosemide (LASIX) 20 MG tablet Take 1 tablet (20 mg total) by mouth daily as needed.  30 tablet  0  . propranolol (INDERAL) 10 MG tablet Take 1 tablet (10 mg total) by mouth  3 (three) times daily.  90 tablet  2  . tiZANidine (ZANAFLEX) 4 MG tablet Take 2-4 mg by mouth every 6 (six) hours as needed (Spasms).      . vitamin B-12 (CYANOCOBALAMIN) 1000 MCG tablet Take 1,000 mcg by mouth daily.       No current facility-administered medications on file prior to visit.    BP 100/78  Pulse 88  Temp(Src) 98.1 F (36.7 C) (Oral)  Resp 18  Ht 5' 2.5" (1.588 m)  Wt 299 lb 1.3 oz (135.662 kg)  BMI 53.80 kg/m2  SpO2 99%                   Objective:   Physical Exam  Constitutional: She is oriented to person, place, and time. No distress.  HENT:  Head: Normocephalic and atraumatic.  Mouth/Throat: No oropharyngeal exudate.  Neck: Neck supple.  Cardiovascular: Normal rate and regular rhythm.   No murmur  heard. Pulmonary/Chest: Breath sounds normal. No respiratory distress. She has no wheezes. She has no rales.  Lymphadenopathy:    She has no cervical adenopathy.  Neurological: She is alert and oriented to person, place, and time.  Skin: Skin is warm and dry. She is not diaphoretic.  Psychiatric: Her behavior is normal.  Somewhat flat affect.          Assessment & Plan:  Patient seen by Jake Bathe NP-student.  I have personally seen and examined patient and agree with Ms. Whitmire's assessment and plan.

## 2013-07-02 NOTE — Assessment & Plan Note (Signed)
Unchanged. Will rx with Augmentin.  Recommended that pt start flonase and antihistamine.  If symptoms worsen or if symptoms do not improve, pt is instructed to contact us.Would consider CT sinus at that time.  FMLA paperwork was provided today.

## 2013-07-02 NOTE — Progress Notes (Signed)
Pre visit review using our clinic review tool, if applicable. No additional management support is needed unless otherwise documented below in the visit note. 

## 2013-07-02 NOTE — Patient Instructions (Signed)
Start Augmentin Start claritin 10mg  once daily Restart flonase. Call if symptoms worsen or if symptoms are not improved in 2-3 days.

## 2013-07-02 NOTE — Telephone Encounter (Signed)
Pt brought FMLA papers for herself. Forms completed and she was given FMLA forms for her and her spouse.

## 2013-07-05 ENCOUNTER — Telehealth: Payer: Self-pay | Admitting: *Deleted

## 2013-07-05 NOTE — Telephone Encounter (Signed)
Pt left message stating she would like to add some additional dates for upcoming and possibly past appts relating to current FMLA. Left detailed message on pt's voicemail to bring original and updated forms to discuss with Provider and I would get back with her tomorrow to let her know the outcome. Awaiting FMLA forms.

## 2013-07-05 NOTE — Telephone Encounter (Signed)
Pt left message that there are some areas on the FMLA that are incomplete and she would like [place] to address them before she turns them in. She is also requesting that day per month be changed to 2-3 as she states that she has 2 appts coming up this month and she wants these to be covered. Left message for pt to return my call and let us know specifically what is incomplete with forms.

## 2013-07-06 NOTE — Telephone Encounter (Signed)
Left message for pt that originals have been placed at front desk for pick up. Copies sent to be scanned.

## 2013-07-06 NOTE — Telephone Encounter (Signed)
Forms received and forwarded to Provider for review / recommendation.  Please advise.

## 2013-07-06 NOTE — Telephone Encounter (Signed)
See FMLA form.

## 2013-07-08 ENCOUNTER — Ambulatory Visit (INDEPENDENT_AMBULATORY_CARE_PROVIDER_SITE_OTHER): Payer: Managed Care, Other (non HMO) | Admitting: Family

## 2013-07-08 ENCOUNTER — Encounter: Payer: Self-pay | Admitting: Family

## 2013-07-08 VITALS — BP 128/86 | HR 86 | Temp 98.1°F | Resp 18 | Ht 62.5 in | Wt 300.5 lb

## 2013-07-08 DIAGNOSIS — Z840 Family history of diseases of the skin and subcutaneous tissue: Secondary | ICD-10-CM

## 2013-07-08 DIAGNOSIS — Z8481 Family history of carrier of genetic disease: Secondary | ICD-10-CM

## 2013-07-08 DIAGNOSIS — D649 Anemia, unspecified: Secondary | ICD-10-CM

## 2013-07-08 DIAGNOSIS — Z832 Family history of diseases of the blood and blood-forming organs and certain disorders involving the immune mechanism: Secondary | ICD-10-CM

## 2013-07-08 DIAGNOSIS — J329 Chronic sinusitis, unspecified: Secondary | ICD-10-CM

## 2013-07-08 DIAGNOSIS — R0602 Shortness of breath: Secondary | ICD-10-CM

## 2013-07-08 DIAGNOSIS — Z8269 Family history of other diseases of the musculoskeletal system and connective tissue: Secondary | ICD-10-CM

## 2013-07-08 DIAGNOSIS — R609 Edema, unspecified: Secondary | ICD-10-CM

## 2013-07-08 LAB — CBC WITH DIFFERENTIAL/PLATELET
BASOS ABS: 0 10*3/uL (ref 0.0–0.1)
Basophils Relative: 0 % (ref 0–1)
EOS ABS: 0.1 10*3/uL (ref 0.0–0.7)
EOS PCT: 2 % (ref 0–5)
HCT: 39.1 % (ref 36.0–46.0)
Hemoglobin: 12.8 g/dL (ref 12.0–15.0)
LYMPHS PCT: 23 % (ref 12–46)
Lymphs Abs: 1.6 10*3/uL (ref 0.7–4.0)
MCH: 25.2 pg — ABNORMAL LOW (ref 26.0–34.0)
MCHC: 32.7 g/dL (ref 30.0–36.0)
MCV: 77 fL — AB (ref 78.0–100.0)
Monocytes Absolute: 0.4 10*3/uL (ref 0.1–1.0)
Monocytes Relative: 6 % (ref 3–12)
Neutro Abs: 4.8 10*3/uL (ref 1.7–7.7)
Neutrophils Relative %: 69 % (ref 43–77)
PLATELETS: 331 10*3/uL (ref 150–400)
RBC: 5.08 MIL/uL (ref 3.87–5.11)
RDW: 16.5 % — AB (ref 11.5–15.5)
WBC: 7 10*3/uL (ref 4.0–10.5)

## 2013-07-08 LAB — BASIC METABOLIC PANEL
BUN: 14 mg/dL (ref 6–23)
CO2: 26 mEq/L (ref 19–32)
CREATININE: 0.96 mg/dL (ref 0.50–1.10)
Calcium: 9.3 mg/dL (ref 8.4–10.5)
Chloride: 108 mEq/L (ref 96–112)
Glucose, Bld: 89 mg/dL (ref 70–99)
Potassium: 4.6 mEq/L (ref 3.5–5.3)
Sodium: 142 mEq/L (ref 135–145)

## 2013-07-08 LAB — D-DIMER, QUANTITATIVE: D-Dimer, Quant: 0.27 ug/mL-FEU (ref 0.00–0.48)

## 2013-07-08 LAB — SICKLE CELL SCREEN: Sickle Cell Screen: NEGATIVE

## 2013-07-08 NOTE — Progress Notes (Signed)
Subjective:    Patient ID: Diane Patel, female    DOB: 1981-01-08, 33 y.o.   MRN: [id number]  HPI  Diane Patel is a 33 yr old female who presents today for follow up. She was last seen on 07/02/13.  She was treated with augmentin, and instructed tot start flonase and an an antihistamine. Prior to this appointment she was seen on 06/21/13 and was treated with amoxicillin for sinusitis.  She also noted edema at that time and lasix was added prn. A 2-D echo was performed at that time as well.  LVEF was 55-60%. She was noted to have mild LVH.    She reports that she has pain and pressure in the frontal sinuses area, the facial pressure has resolved.  She is using tylenol as needed.  Note some shortness of breath episodes throughout the day. She has been using albuterol during these episodes.  Reports that albuterol helps briefly.    She reports that she finds she has to pace herself.  Feels like when she talks her breathing gets tight.  She reports + compliance with the lasix. Reports that her swelling in her hands has improved.   She is requesting testing for lupus as well as sickle cell trait. Reports that she has a maternal aunt with lupus   Review of Systems    see HPI  Past Medical History  Diagnosis Date  . Asthma   . Depression   . Headaches, cluster     frequent  . Allergy   . Hyperlipidemia   . Obesity, morbid   . Back pain     History   Social History  . Marital Status: Married    Spouse Name: N/A    Number of Children: 0  . Years of Education: N/A   Occupational History  . unemployed    Social History Main Topics  . Smoking status: Never Smoker   . Smokeless tobacco: Never Used  . Alcohol Use: No  . Drug Use: No  . Sexual Activity: Not on file   Other Topics Concern  . Not on file   Social History Narrative   Regular excercise    Past Surgical History  Procedure Laterality Date  . Appendectomy  2004  . Cholecystectomy  2010  . Tonsillectomy and  adenoidectomy  1985    Family History  Problem Relation Age of Onset  . Hypertension Mother   . Diabetes Mother   . Heart disease Mother   . Polycystic ovary syndrome Mother   . Diabetes Father   . Hypertension Father   . Diabetes Sister   . Pancreatitis Sister   . Cancer Maternal Grandmother     ? stomach cancer  . Diabetes Maternal Grandmother   . Cancer Maternal Grandfather     bone  . Glaucoma Maternal Grandfather   . Osteoporosis Maternal Grandfather   . Hypertension Paternal Grandfather   . Mental illness Paternal Grandfather     Allergies  Allergen Reactions  . Ace Inhibitors     REACTION: angioedema; lips, throat, tounge swelling  . Cetirizine Hcl   . Flexeril [Cyclobenzaprine] Itching  . Naproxen Itching  . Pineapple Hives    Current Outpatient Prescriptions on File Prior to Visit  Medication Sig Dispense Refill  . albuterol (PROVENTIL HFA;VENTOLIN HFA) 108 (90 BASE) MCG/ACT inhaler Inhale 2 puffs into the lungs every 6 (six) hours as needed for wheezing.  1 Inhaler  0  . amoxicillin-clavulanate (AUGMENTIN ES-600) 600-42.9 MG/5ML suspension Take  7 mLs by mouth 2 (two) times daily. For 10 days.  200 mL  0  . ferrous sulfate 325 (65 FE) MG tablet Take 325 mg by mouth 2 (two) times daily with a meal.      . fluticasone (FLONASE) 50 MCG/ACT nasal spray Place 2 sprays into the nose daily.  16 g  6  . furosemide (LASIX) 20 MG tablet Take 1 tablet (20 mg total) by mouth daily as needed.  30 tablet  0  . levonorgestrel-ethinyl estradiol (AVIANE,ALESSE,LESSINA) 0.1-20 MG-MCG tablet Take 1 tablet by mouth daily.      . pantoprazole (PROTONIX) 40 MG tablet Take 40 mg by mouth daily.      . propranolol (INDERAL) 10 MG tablet Take 1 tablet (10 mg total) by mouth 3 (three) times daily.  90 tablet  2  . tiZANidine (ZANAFLEX) 4 MG tablet Take 2-4 mg by mouth every 6 (six) hours as needed (Spasms).      . vitamin B-12 (CYANOCOBALAMIN) 1000 MCG tablet Take 1,000 mcg by mouth  daily.       No current facility-administered medications on file prior to visit.    BP 128/86  Pulse 86  Temp(Src) 98.1 F (36.7 C) (Oral)  Resp 18  Ht 5' 2.5" (1.588 m)  Wt 300 lb 8 oz (136.306 kg)  BMI 54.05 kg/m2  SpO2 98%  LMP 05/05/2013    Objective:   Physical Exam  Constitutional: She is oriented to person, place, and time. She appears well-developed and well-nourished. No distress.  HENT:  Head: Normocephalic and atraumatic.  No frontal of maxillary sinus tenderness to palpation  Cardiovascular: Normal rate and regular rhythm.   No murmur heard. Pulmonary/Chest: Effort normal and breath sounds normal. No respiratory distress. She has no wheezes. She has no rales. She exhibits no tenderness.  Musculoskeletal: She exhibits no edema.  Lymphadenopathy:    She has no cervical adenopathy.  Neurological: She is alert and oriented to person, place, and time.  Psychiatric: She has a normal mood and affect. Her behavior is normal. Thought content normal.          Assessment & Plan:

## 2013-07-08 NOTE — Progress Notes (Signed)
Pre visit review using our clinic review tool, if applicable. No additional management support is needed unless otherwise documented below in the visit note/SLS  

## 2013-07-08 NOTE — Patient Instructions (Signed)
Please complete lab work prior to leaving. Call if symptoms worsen or if symptoms do not improve.  Follow up in 6 weeks.

## 2013-07-09 ENCOUNTER — Emergency Department (HOSPITAL_BASED_OUTPATIENT_CLINIC_OR_DEPARTMENT_OTHER): Payer: Managed Care, Other (non HMO)

## 2013-07-09 ENCOUNTER — Encounter (HOSPITAL_BASED_OUTPATIENT_CLINIC_OR_DEPARTMENT_OTHER): Payer: Self-pay | Admitting: Emergency Medicine

## 2013-07-09 ENCOUNTER — Encounter: Payer: Self-pay | Admitting: Family

## 2013-07-09 ENCOUNTER — Emergency Department (HOSPITAL_BASED_OUTPATIENT_CLINIC_OR_DEPARTMENT_OTHER)
Admission: EM | Admit: 2013-07-09 | Discharge: 2013-07-09 | Disposition: A | Discharge status: Home / Self Care | Payer: Managed Care, Other (non HMO) | Attending: Emergency Medicine | Admitting: Emergency Medicine

## 2013-07-09 DIAGNOSIS — J45901 Unspecified asthma with (acute) exacerbation: Secondary | ICD-10-CM | POA: Insufficient documentation

## 2013-07-09 DIAGNOSIS — R0989 Other specified symptoms and signs involving the circulatory and respiratory systems: Principal | ICD-10-CM | POA: Insufficient documentation

## 2013-07-09 DIAGNOSIS — Z8679 Personal history of other diseases of the circulatory system: Secondary | ICD-10-CM | POA: Insufficient documentation

## 2013-07-09 DIAGNOSIS — R0602 Shortness of breath: Secondary | ICD-10-CM | POA: Insufficient documentation

## 2013-07-09 DIAGNOSIS — R0600 Dyspnea, unspecified: Secondary | ICD-10-CM

## 2013-07-09 DIAGNOSIS — IMO0002 Reserved for concepts with insufficient information to code with codable children: Secondary | ICD-10-CM | POA: Insufficient documentation

## 2013-07-09 DIAGNOSIS — Z79899 Other long term (current) drug therapy: Secondary | ICD-10-CM | POA: Insufficient documentation

## 2013-07-09 DIAGNOSIS — Z8269 Family history of other diseases of the musculoskeletal system and connective tissue: Secondary | ICD-10-CM | POA: Insufficient documentation

## 2013-07-09 DIAGNOSIS — Z8659 Personal history of other mental and behavioral disorders: Secondary | ICD-10-CM | POA: Insufficient documentation

## 2013-07-09 DIAGNOSIS — R0609 Other forms of dyspnea: Secondary | ICD-10-CM | POA: Insufficient documentation

## 2013-07-09 DIAGNOSIS — Z792 Long term (current) use of antibiotics: Secondary | ICD-10-CM | POA: Insufficient documentation

## 2013-07-09 LAB — CBC WITH DIFFERENTIAL/PLATELET
Basophils Absolute: 0 10*3/uL (ref 0.0–0.1)
Basophils Relative: 0 % (ref 0–1)
EOS ABS: 0.1 10*3/uL (ref 0.0–0.7)
Eosinophils Relative: 2 % (ref 0–5)
HCT: 41.8 % (ref 36.0–46.0)
HEMOGLOBIN: 13.6 g/dL (ref 12.0–15.0)
LYMPHS ABS: 2 10*3/uL (ref 0.7–4.0)
LYMPHS PCT: 25 % (ref 12–46)
MCH: 25.8 pg — ABNORMAL LOW (ref 26.0–34.0)
MCHC: 32.5 g/dL (ref 30.0–36.0)
MCV: 79.2 fL (ref 78.0–100.0)
Monocytes Absolute: 0.4 10*3/uL (ref 0.1–1.0)
Monocytes Relative: 5 % (ref 3–12)
NEUTROS PCT: 68 % (ref 43–77)
Neutro Abs: 5.4 10*3/uL (ref 1.7–7.7)
Platelets: 311 10*3/uL (ref 150–400)
RBC: 5.28 MIL/uL — AB (ref 3.87–5.11)
RDW: 16.7 % — ABNORMAL HIGH (ref 11.5–15.5)
WBC: 7.9 10*3/uL (ref 4.0–10.5)

## 2013-07-09 LAB — COMPREHENSIVE METABOLIC PANEL
ALT: 20 U/L (ref 0–35)
AST: 28 U/L (ref 0–37)
Albumin: 4 g/dL (ref 3.5–5.2)
Alkaline Phosphatase: 59 U/L (ref 39–117)
BILIRUBIN TOTAL: 0.5 mg/dL (ref 0.3–1.2)
BUN: 12 mg/dL (ref 6–23)
CHLORIDE: 108 meq/L (ref 96–112)
CO2: 21 meq/L (ref 19–32)
Calcium: 9.5 mg/dL (ref 8.4–10.5)
Creatinine, Ser: 1 mg/dL (ref 0.50–1.10)
GFR calc Af Amer: 84 mL/min — ABNORMAL LOW (ref >90)
GFR calc non Af Amer: 73 mL/min — ABNORMAL LOW (ref >90)
GLUCOSE: 80 mg/dL (ref 70–99)
POTASSIUM: 4.2 meq/L (ref 3.7–5.3)
SODIUM: 143 meq/L (ref 137–147)
TOTAL PROTEIN: 7.8 g/dL (ref 6.0–8.3)

## 2013-07-09 LAB — ANA: Anti Nuclear Antibody(ANA): NEGATIVE

## 2013-07-09 MED ORDER — KETOROLAC TROMETHAMINE 30 MG/ML IJ SOLN
30.0000 mg | Freq: Once | INTRAMUSCULAR | Status: AC
  Administered 2013-07-09: 30 mg via INTRAVENOUS
  Filled 2013-07-09: qty 1

## 2013-07-09 MED ORDER — DIPHENHYDRAMINE HCL 50 MG/ML IJ SOLN
25.0000 mg | Freq: Once | INTRAMUSCULAR | Status: AC
  Administered 2013-07-09: 25 mg via INTRAVENOUS
  Filled 2013-07-09: qty 1

## 2013-07-09 MED ORDER — PREDNISONE 10 MG PO TABS
60.0000 mg | ORAL_TABLET | Freq: Every day | ORAL | Status: DC
  Administered 2013-07-09: 60 mg via ORAL
  Filled 2013-07-09 (×2): qty 1

## 2013-07-09 MED ORDER — METOCLOPRAMIDE HCL 5 MG/ML IJ SOLN
10.0000 mg | Freq: Once | INTRAMUSCULAR | Status: AC
  Administered 2013-07-09: 10 mg via INTRAVENOUS
  Filled 2013-07-09: qty 2

## 2013-07-09 MED ORDER — PREDNISONE 10 MG PO TABS: ORAL_TABLET | ORAL | Status: DC

## 2013-07-09 MED ORDER — ALBUTEROL SULFATE (2.5 MG/3ML) 0.083% IN NEBU
5.0000 mg | INHALATION_SOLUTION | Freq: Once | RESPIRATORY_TRACT | Status: AC
  Administered 2013-07-09: 5 mg via RESPIRATORY_TRACT
  Filled 2013-07-09: qty 6

## 2013-07-09 NOTE — ED Provider Notes (Signed)
CSN: [id number]     Arrival date & time 07/09/13  1426 History   None    Chief Complaint  Patient presents with  . Shortness of Breath     (Consider location/radiation/quality/duration/timing/severity/associated sxs/prior Treatment) Patient is a 33 y.o. female presenting with shortness of breath. The history is provided by the patient. No language interpreter was used.  Shortness of Breath Severity:  Moderate Onset quality:  Gradual Duration:  2 weeks Timing:  Constant Progression:  Worsening Chronicity:  New Context: activity   Relieved by:  Nothing Worsened by:  Nothing tried Ineffective treatments:  Inhaler and lying down Associated symptoms: no abdominal pain   Risk factors: no recent alcohol use     Past Medical History  Diagnosis Date  . Asthma   . Depression   . Headaches, cluster     frequent  . Allergy   . Hyperlipidemia   . Obesity, morbid   . Back pain    Past Surgical History  Procedure Laterality Date  . Appendectomy  2004  . Cholecystectomy  2010  . Tonsillectomy and adenoidectomy  1985   Family History  Problem Relation Age of Onset  . Hypertension Mother   . Diabetes Mother   . Heart disease Mother   . Polycystic ovary syndrome Mother   . Diabetes Father   . Hypertension Father   . Diabetes Sister   . Pancreatitis Sister   . Cancer Maternal Grandmother     ? stomach cancer  . Diabetes Maternal Grandmother   . Cancer Maternal Grandfather     bone  . Glaucoma Maternal Grandfather   . Osteoporosis Maternal Grandfather   . Hypertension Paternal Grandfather   . Mental illness Paternal Grandfather    History  Substance Use Topics  . Smoking status: Never Smoker   . Smokeless tobacco: Never Used  . Alcohol Use: No   OB History   Grav Para Term Preterm Abortions TAB SAB Ect Mult Living                 Review of Systems  Respiratory: Positive for shortness of breath.   Gastrointestinal: Negative for abdominal pain.  All other systems  reviewed and are negative.     Allergies  Ace inhibitors; Cetirizine hcl; Flexeril; Naproxen; and Pineapple  Home Medications   Prior to Admission medications   Medication Sig Start Date End Date Taking? Authorizing Provider  albuterol (PROVENTIL HFA;VENTOLIN HFA) 108 (90 BASE) MCG/ACT inhaler Inhale 2 puffs into the lungs every 6 (six) hours as needed for wheezing. 09/23/12   Leeanne Rio, PA-C  amoxicillin-clavulanate (AUGMENTIN ES-600) 600-42.9 MG/5ML suspension Take 7 mLs by mouth 2 (two) times daily. For 10 days. 07/02/13   Debbrah Alar, NP  ferrous sulfate 325 (65 FE) MG tablet Take 325 mg by mouth 2 (two) times daily with a meal.    Historical Provider, MD  fluticasone (FLONASE) 50 MCG/ACT nasal spray Place 2 sprays into the nose daily. 09/23/12   Leeanne Rio, PA-C  furosemide (LASIX) 20 MG tablet Take 1 tablet (20 mg total) by mouth daily as needed. 06/21/13   Debbrah Alar, NP  levonorgestrel-ethinyl estradiol (AVIANE,ALESSE,LESSINA) 0.1-20 MG-MCG tablet Take 1 tablet by mouth daily.    Historical Provider, MD  pantoprazole (PROTONIX) 40 MG tablet Take 40 mg by mouth daily.    Historical Provider, MD  propranolol (INDERAL) 10 MG tablet Take 1 tablet (10 mg total) by mouth 3 (three) times daily. 12/21/12   Debbrah Alar, NP  tiZANidine (ZANAFLEX) 4 MG tablet Take 2-4 mg by mouth every 6 (six) hours as needed (Spasms).    Historical Provider, MD  vitamin B-12 (CYANOCOBALAMIN) 1000 MCG tablet Take 1,000 mcg by mouth daily.    Historical Provider, MD   BP 156/98  Pulse 101  Temp(Src) 98.2 F (36.8 C) (Oral)  Resp 20  Ht 5\' 3"  (1.6 m)  Wt 300 lb (136.079 kg)  BMI 53.16 kg/m2  SpO2 100%  LMP 05/05/2013 Physical Exam  Nursing note and vitals reviewed. Constitutional: She appears well-developed and well-nourished.  HENT:  Head: Normocephalic.  Right Ear: External ear normal.  Left Ear: External ear normal.  Eyes: Conjunctivae are normal. Pupils are  equal, round, and reactive to light.  Neck: Normal range of motion. Neck supple.  Cardiovascular: Normal rate and normal heart sounds.   Pulmonary/Chest: Effort normal and breath sounds normal.  Abdominal: Soft.  Musculoskeletal: Normal range of motion.  Neurological: She is alert.  Skin: Skin is warm.    ED Course  Procedures (including critical care time) Labs Review Labs Reviewed - No data to display  Imaging Review No results found.   EKG Interpretation None      MDM    Final diagnoses:  Dyspnea    Pt started on prednisone,   I suspect asthma is causing some of pt's problem breathing at night.   Pt given 60 mg here.   Pt is scheduled to be fitted for cpap this week.       Jan [place], PA-C 07/09/13 2156

## 2013-07-09 NOTE — Discharge Instructions (Signed)

## 2013-07-09 NOTE — Assessment & Plan Note (Signed)
Likely multifactorial:  Severe OSA, asthma, morbid obesity and ? Anxiety.  Await CPAP titration for OSA.   Continue albuterol.

## 2013-07-09 NOTE — Assessment & Plan Note (Signed)
Check ANA, also check sickle cell screen at pt request.

## 2013-07-09 NOTE — ED Notes (Signed)
Pt. Has multiple complaints and reports multiple symptoms.

## 2013-07-09 NOTE — ED Notes (Addendum)
Sob x 2 weeks. Hx of asthma. No relief with her Albuterol inhaler. No wheezing. Ambulatory to tx room. Speaks in complete sentences. She has been dealing with URI and sinus infections for weeks. She is taking Augmentin for same. She was seen by her MD yesterday for sob, headaches pain on her left side and chest pain. She had a normal EKG. She is going through sleep studies.

## 2013-07-09 NOTE — Assessment & Plan Note (Signed)
Resolving. Continue flonase and antihistamine.

## 2013-07-10 NOTE — ED Provider Notes (Signed)
Medical screening examination/treatment/procedure(s) were performed by non-physician practitioner and as supervising physician I was immediately available for consultation/collaboration.   EKG Interpretation None       Leota Jacobsen, MD 07/10/13 2321

## 2013-07-13 ENCOUNTER — Ambulatory Visit (HOSPITAL_BASED_OUTPATIENT_CLINIC_OR_DEPARTMENT_OTHER): Payer: Managed Care, Other (non HMO) | Attending: Family | Admitting: Radiology

## 2013-07-13 VITALS — Ht 63.0 in | Wt 300.0 lb

## 2013-07-13 DIAGNOSIS — R0989 Other specified symptoms and signs involving the circulatory and respiratory systems: Secondary | ICD-10-CM | POA: Insufficient documentation

## 2013-07-13 DIAGNOSIS — R0609 Other forms of dyspnea: Secondary | ICD-10-CM | POA: Insufficient documentation

## 2013-07-13 DIAGNOSIS — Z6841 Body Mass Index (BMI) 40.0 and over, adult: Secondary | ICD-10-CM | POA: Insufficient documentation

## 2013-07-13 DIAGNOSIS — R404 Transient alteration of awareness: Secondary | ICD-10-CM | POA: Insufficient documentation

## 2013-07-13 DIAGNOSIS — E669 Obesity, unspecified: Secondary | ICD-10-CM | POA: Insufficient documentation

## 2013-07-13 DIAGNOSIS — G4733 Obstructive sleep apnea (adult) (pediatric): Secondary | ICD-10-CM | POA: Insufficient documentation

## 2013-07-14 ENCOUNTER — Telehealth: Payer: Self-pay | Admitting: *Deleted

## 2013-07-14 NOTE — Telephone Encounter (Signed)
Pt left message requesting recent test results. Reviewed results with pt per 07/09/13 lab letter and she voices understanding.

## 2013-07-19 ENCOUNTER — Telehealth: Payer: Self-pay | Admitting: Family

## 2013-07-19 DIAGNOSIS — G4730 Sleep apnea, unspecified: Secondary | ICD-10-CM

## 2013-07-19 DIAGNOSIS — J45909 Unspecified asthma, uncomplicated: Secondary | ICD-10-CM

## 2013-07-19 DIAGNOSIS — G4710 Hypersomnia, unspecified: Secondary | ICD-10-CM

## 2013-07-19 NOTE — Telephone Encounter (Signed)
Left message for patient to return my call.             Good Morning,  I was checking Diane Patel's voicemail and this lady left a message this morning stating she is short of breath and she is having a hard time at work. Would you mind calling her and getting her to [facility] since we don't have any appointments today. I am leaving now for a meeting and I don't want her to have to wait until I get back.

## 2013-07-19 NOTE — Telephone Encounter (Signed)
Referral placed to Pulmonology for SOB and asthma.  I recommend that she schedule an office visit in the meantime so that we can get better control of her symptoms.

## 2013-07-19 NOTE — Sleep Study (Signed)
[place]  NAME: Diane Patel  DATE OF BIRTH: Oct 31, 1980  MEDICAL RECORD NUMBER [id number]  LOCATION: [facility]  PHYSICIAN: Akita Maxim V.  DATE OF STUDY: 07/13/13   SLEEP STUDY TYPE: CPAP titration study               REFERRING PHYSICIAN: Rigoberto Noel, MD  INDICATION FOR STUDY: 33 year old obese woman with excessive daytime somnolence, loud snoring and witnessed apneas. Home sleep study showed AHI of 29 events per hour with lowest desaturation of 72%. Hence CPAP titration was scheduled. At the time of this study ,they weighed 300 pounds with a height of  5 ft 3 inches and the BMI of 53, neck size of 15 inches. Epworth sleepiness score was 11   This CPAP titration polysomnogram was performed with a sleep technologist in attendance. EEG, EOG,EMG and respiratory parameters recorded. Sleep stages, arousals, limb movements and respiratory data was scored according to criteria laid out by the American Academy of sleep medicine.  SLEEP ARCHITECTURE: Lights out was at 2212  PM and lights on was at 457 AM. Total sleep time was 329 minutes with sleep period time of 380 minutes and sleep efficiency of 81% .Sleep latency was 24 minutes with latency to REM sleep of 132 minutes and wake after sleep onset of 51 minutes.  Sleep stages as a percentage of total sleep time was N1 -8 %,N2- 64 % and REM sleep 27 % ( 91 minutes) . The longest period of REM sleep was around 3 AM.   AROUSAL DATA : There were 38 arousals with an arousal index of 7 events per hour. Of these 28 were spontaneous, and 10 were associated with respiratory events and 0 were associated periodic limb movements  RESPIRATORY DATA: CPAP was initiated at 5 centimeters and titrated to a final level of 13 centimeters due to respiratory events and snoring. At the final level of 13 centimeters 477 minutes including 22 minutes of REM sleep, there were 0 obstructive apneas, 0 central apneas, 0 mixed  apneas and 0 hypopneas with apnea -hypopnea index of 0 events per hour.  Supine REM sleep was noted .Titration was optimal.  MOVEMENT/PARASOMNIA: There were 0 PLMS with a PLM index of 0 events per hour. The PLM arousal index was 0 events per hour.  OXYGEN DATA: The lowest desaturation was 88 % during non-REM sleep and the desaturation index was 7 per hour. The saturations stayed below 88% for 0 minutes.  CARDIAC DATA: The low heart rate was 30 beats per minute. The high heart rate recorded was an artifact. No arrhythmias were noted   IMPRESSION :  1. moderate obstructive sleep apnea with hypopneas causing sleep fragmentation and mild oxygen desaturation. 2. This was corrected by CPAP of 13 centimeters with a small fullface mask. Titration was optimal. 3. No evidence of cardiac arrhythmias or behavioral disturbance during sleep. 4. Periodic limb movements were not noted.  RECOMMENDATION:    1. The treatment options for this degree of sleep disordered breathing includes weight loss and CPAP therapy. CPAP can be initiated at 13 centimeters with a small fullface mask and compliance monitored at this level. 2. Patient should be cautioned against driving when sleepy 3. They should be asked to avoid medications with sedative side effects  Rigoberto Noel  MD Diplomate, American Board of Sleep Medicine  ELECTRONICALLY SIGNED ON: 07/19/2013  [facility] PH: [phone]   FX: [phone] [place]  AC[employer]EMY OF SLEEP MEDICINE

## 2013-07-19 NOTE — Telephone Encounter (Signed)
Called patient back, she initially refused to be sent through to [facility]. Patient states she has spoken with the 24 hour nurse line through her office and they suggest she be referred to a pulmonary doctor. She has also been to the hospital in the last few weeks. She says now that she is having trouble doing her job due to the Shortness of breath and she is having headaches now and having to use her inhaler several times a day. After speaking with patient further she agreed to speak with [facility] and she also requests to have a pulmonary referral put in so we can get her scheduled this week.   Patient transferred to [facility], please advise referral

## 2013-07-20 ENCOUNTER — Encounter: Payer: Self-pay | Admitting: Family

## 2013-07-20 NOTE — Telephone Encounter (Signed)
Informed patient of pulmonology referral and she scheduled appointment for 07/21/13 with Elyn Aquas, PA-C.

## 2013-07-20 NOTE — Telephone Encounter (Signed)
Patient will see Dr. Melvyn Novas tomorrow and appointment with Elyn Aquas rescheduled for 07/28/13 for the appropriate time.

## 2013-07-20 NOTE — Telephone Encounter (Signed)
Patient is an ER follow-up and should have been allotted a 30-minute appointment.  Please change this or block a later time slot.

## 2013-07-21 ENCOUNTER — Encounter (INDEPENDENT_AMBULATORY_CARE_PROVIDER_SITE_OTHER): Payer: Self-pay

## 2013-07-21 ENCOUNTER — Ambulatory Visit: Payer: Managed Care, Other (non HMO) | Admitting: Physician Assistant

## 2013-07-21 ENCOUNTER — Encounter: Payer: Self-pay | Admitting: Internal Medicine

## 2013-07-21 ENCOUNTER — Ambulatory Visit (INDEPENDENT_AMBULATORY_CARE_PROVIDER_SITE_OTHER): Payer: Managed Care, Other (non HMO) | Admitting: Internal Medicine

## 2013-07-21 VITALS — BP 116/74 | HR 111 | Temp 98.3°F | Ht 63.0 in | Wt 302.0 lb

## 2013-07-21 DIAGNOSIS — R0602 Shortness of breath: Secondary | ICD-10-CM

## 2013-07-21 DIAGNOSIS — J45909 Unspecified asthma, uncomplicated: Secondary | ICD-10-CM

## 2013-07-21 NOTE — Patient Instructions (Addendum)
Pantoprazole (protonix) 40 mg   Take 30-60 min before first meal of the day and Pepcid 20 mg one bedtime until return to office - this is the best way to tell whether stomach acid is contributing to your problem.    You may need an alternative to your bcps (DR haygood)   For drainage take chlortrimeton (chlorpheniramine) 4 mg every 4 hours available over the counter (may cause drowsiness)   GERD (REFLUX)  is an extremely common cause of respiratory symptoms, many times with no significant heartburn at all.    It can be treated with medication, but also with lifestyle changes including avoidance of late meals, excessive alcohol, smoking cessation, and avoid fatty foods, chocolate, peppermint, colas, red wine, and acidic juices such as orange juice.  NO MINT OR MENTHOL PRODUCTS SO NO COUGH DROPS  USE SUGARLESS CANDY INSTEAD (jolley ranchers or Engineer, materials) NO OIL BASED VITAMINS - use powdered substitutes.  No work until 07/26/13   Please schedule a follow up office visit in 2 weeks, sooner if needed

## 2013-07-21 NOTE — Progress Notes (Signed)
Subjective:    Patient ID: Diane Patel, female    DOB: 06-05-1980   MRN: [id number]  HPI  33 yobf never smoker needed tonsils out at age 33, humdifier in bedroom and inhalers in HS but eventually outgrew daily need and 35s 160-185 but still used nhalers= albuterol sev times a year with uri's changed for the worse in May 2015 with chest heaviness and inhalers not working as well and needed more frequently and eventually to ER 6/515 and no better p prednisone and referred 07/21/2013 to pulmonary clinic by Dr Einar Pheasant    07/21/2013 1st [facility] office visit/ Wert  Chief Complaint  Patient presents with  . Pulmonary Consult    Referred per Dr. Hassell Done. Pt c/o SOB, cough and CP since May 2015. She is using albuterol inhaler approx 10 times per day.  present symptoms started on inderal which has been stopped Cough is worse with talking and takes her breath away at times  Walking across the lobby sob  Wt up about 20-30 lbs over baseline since onset of symptoms Not able to lie flat comfortably x 6 m, cpap ordered  Last saba 4 h prior to OV    No obvious other patterns in day to day or daytime variabilty or assoc chronic cough or cp or chest tightness, subjective wheeze overt sinus or hb symptoms. No unusual exp hx or h/o childhood pna/ asthma or knowledge of premature birth.  Sleeping ok without nocturnal  or early am exacerbation  of respiratory  c/o's or need for noct saba. Also denies any obvious fluctuation of symptoms with weather or environmental changes or other aggravating or alleviating factors except as outlined above   Current Medications, Allergies, Complete Past Medical History, Past Surgical History, Family History, and Social History were reviewed in [employer] record.            Review of Systems  Constitutional: Negative for fever, chills and unexpected weight change.  HENT: Positive for congestion. Negative for dental problem, ear pain,  nosebleeds, postnasal drip, rhinorrhea, sinus pressure, sneezing, sore throat, trouble swallowing and voice change.   Eyes: Negative for visual disturbance.  Respiratory: Positive for cough and shortness of breath. Negative for choking.   Cardiovascular: Negative for chest pain and leg swelling.  Gastrointestinal: Positive for diarrhea. Negative for vomiting and abdominal pain.  Genitourinary: Negative for difficulty urinating.  Musculoskeletal: Negative for arthralgias.  Skin: Negative for rash.  Neurological: Positive for headaches. Negative for tremors and syncope.  Hematological: Does not bruise/bleed easily.       Objective:   Physical Exam  amb bf   Wt Readings from Last 3 Encounters:  07/21/13 302 lb (136.986 kg)  07/13/13 300 lb (136.079 kg)  07/09/13 300 lb (136.079 kg)      HEENT: nl dentition, turbinates, and orophanx. Nl external ear canals without cough reflex   NECK :  without JVD/Nodes/TM/ nl carotid upstrokes bilaterally   LUNGS: no acc muscle use, clear to A and P bilaterally without cough on insp or exp maneuvers   CV:  RRR  no s3 or murmur or increase in P2, no edema   ABD:  soft and nontender with nl excursion in the supine position. No bruits or organomegaly, bowel sounds nl  MS:  warm without deformities, calf tenderness, cyanosis or clubbing  SKIN: warm and dry without lesions    NEURO:  alert, approp, no deficits     07/09/13  No active cardiopulmonary disease.  D dimer , hco3 and hct ok      Assessment & Plan:

## 2013-07-22 NOTE — Assessment & Plan Note (Signed)
Not clear this is is present given even her fef-25-75 is wnl > rx with prn saba with goals of < 2x weekly reviewed

## 2013-07-23 ENCOUNTER — Telehealth: Payer: Self-pay | Admitting: Physician Assistant

## 2013-07-23 ENCOUNTER — Telehealth: Payer: Self-pay | Admitting: Internal Medicine

## 2013-07-23 NOTE — Telephone Encounter (Signed)
Patient has seen Dr. Melvyn Novas for her ER follow-up of asthma. If she is feeling better after her visit with him, she does not have to be seen here for ER follow-up next week.  Patient scheduled next Wednesday

## 2013-07-23 NOTE — Telephone Encounter (Signed)
Thank you for calling patient

## 2013-07-23 NOTE — Telephone Encounter (Signed)
Patient states that she would like to keep the appointment with Elyn Aquas, PA-C.

## 2013-07-23 NOTE — Telephone Encounter (Signed)
lmomtcb x1 

## 2013-07-26 NOTE — Telephone Encounter (Signed)
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

## 2013-07-26 NOTE — Telephone Encounter (Signed)
Pt returned call & can be reached at [phone].  Diane Patel

## 2013-07-26 NOTE — Telephone Encounter (Signed)
lmomtcb x 2  

## 2013-07-27 ENCOUNTER — Encounter: Payer: Self-pay | Admitting: Adult Health

## 2013-07-27 ENCOUNTER — Encounter: Payer: Self-pay | Admitting: *Deleted

## 2013-07-27 ENCOUNTER — Ambulatory Visit (INDEPENDENT_AMBULATORY_CARE_PROVIDER_SITE_OTHER): Payer: Managed Care, Other (non HMO) | Admitting: Adult Health

## 2013-07-27 VITALS — BP 116/84 | HR 91 | Temp 98.2°F | Ht 63.0 in | Wt 304.0 lb

## 2013-07-27 DIAGNOSIS — R058 Other specified cough: Secondary | ICD-10-CM | POA: Insufficient documentation

## 2013-07-27 DIAGNOSIS — R059 Cough, unspecified: Secondary | ICD-10-CM

## 2013-07-27 DIAGNOSIS — R05 Cough: Secondary | ICD-10-CM

## 2013-07-27 NOTE — Patient Instructions (Signed)
Begin Deslym 2 tsp every 12hrs for cough .   Pantoprazole (protonix) 40 mg   Take 30-60 min before first meal of the day and Pepcid 20 mg one bedtime until return to office - this is the best way to tell whether stomach acid is contributing to your problem.    You may need an alternative to your bcps (DR haygood)   For drainage take chlortrimeton (chlorpheniramine) 4 mg every 4 hours available over the counter (may cause drowsiness) As needed  For drainage   GERD (REFLUX)  is an extremely common cause of respiratory symptoms, many times with no significant heartburn at all.    It can be treated with medication, but also with lifestyle changes including avoidance of late meals, excessive alcohol, smoking cessation, and avoid fatty foods, chocolate, peppermint, colas, red wine, and acidic juices such as orange juice.  NO MINT OR MENTHOL PRODUCTS SO NO COUGH DROPS  USE SUGARLESS CANDY INSTEAD (jolley ranchers or Engineer, materials) NO OIL BASED VITAMINS - use powdered substitutes.  No work until 08/04/13   Follow up in 1 week with Dr. Melvyn Novas  As planned and  As needed

## 2013-07-27 NOTE — Assessment & Plan Note (Signed)
Upper airway cough imrpoving with trigger control  Will add deslym  Workup unrevealing w/ nml spirometry , cxr and labs   Plan  Begin Deslym 2 tsp every 12hrs for cough .   Pantoprazole (protonix) 40 mg   Take 30-60 min before first meal of the day and Pepcid 20 mg one bedtime until return to office - this is the best way to tell whether stomach acid is contributing to your problem.    You may need an alternative to your bcps (DR haygood)   For drainage take chlortrimeton (chlorpheniramine) 4 mg every 4 hours available over the counter (may cause drowsiness) As needed  For drainage   GERD (REFLUX)  is an extremely common cause of respiratory symptoms, many times with no significant heartburn at all.    It can be treated with medication, but also with lifestyle changes including avoidance of late meals, excessive alcohol, smoking cessation, and avoid fatty foods, chocolate, peppermint, colas, red wine, and acidic juices such as orange juice.  NO MINT OR MENTHOL PRODUCTS SO NO COUGH DROPS  USE SUGARLESS CANDY INSTEAD (jolley ranchers or Engineer, materials) NO OIL BASED VITAMINS - use powdered substitutes.  No work until 08/04/13   Follow up in 1 week with Dr. Melvyn Novas  As planned and  As needed

## 2013-07-27 NOTE — Progress Notes (Signed)
Subjective:    Patient ID: Diane Patel, female    DOB: 1980/12/20   MRN: [id number]  HPI  27 yobf never smoker needed tonsils out at age 33, humdifier in bedroom and inhalers in HS but eventually outgrew daily need and early 20s 160-185 but still used nhalers= albuterol sev times a year with uri's changed for the worse in May 2015 with chest heaviness and inhalers not working as well and needed more frequently and eventually to ER 6/515 and no better p prednisone and referred 07/21/2013 to pulmonary clinic by Dr Einar Pheasant    07/21/2013 1st [facility] office visit/ Wert  Chief Complaint  Patient presents with  . Pulmonary Consult    Referred per Dr. Hassell Done. Pt c/o SOB, cough and CP since May 2015. She is using albuterol inhaler approx 10 times per day.  present symptoms started on inderal which has been stopped Cough is worse with talking and takes her breath away at times  Walking across the lobby sob  Wt up about 20-30 lbs over baseline since onset of symptoms Not able to lie flat comfortably x 6 m, cpap ordered  Last saba 4 h prior to OV   >>rx PPI/Pepcid , chlortrimeton q4 As needed   , normal spirometry   07/27/2013 Acute OV  Complains of persistent cough, w/  prod cough with yellow mucus, wheezing, dyspnea, hoarseness.   Does feel cough is some improved, but not back to baseline.  Prolonged talking, cold air, change in temp  makes her cough worse.  Previously treated with abx with no improvement.  Spirometry last ov was normal .  Feels chrlotrimeton is helping her throat clearing.  No hemoptysis , chest pain, orthopnea, leg swelling or  Labs last ov ok , D dimer , hco3 and hct  CXR w/ no acute process. NO ambulatory desats.  Not using anything for cough.  Would like would note excusing her from work longer   Current Medications, Allergies, Complete Past Medical History, Past Surgical History, Family History, and Social History were reviewed in [employer] record.            Review of Systems  Constitutional:   No  weight loss, night sweats,  Fevers, chills, fatigue, or  lassitude.  HEENT:   No headaches,  Difficulty swallowing,  Tooth/dental problems, or  Sore throat,                No sneezing, itching, ear ache,  +nasal congestion, post nasal drip,   CV:  No chest pain,  Orthopnea, PND, swelling in lower extremities, anasarca, dizziness, palpitations, syncope.   GI  No heartburn, indigestion, abdominal pain, nausea, vomiting, diarrhea, change in bowel habits, loss of appetite, bloody stools.   Resp:    No chest wall deformity  Skin: no rash or lesions.  GU: no dysuria, change in color of urine, no urgency or frequency.  No flank pain, no hematuria   MS:  No joint pain or swelling.  No decreased range of motion.  No back pain.  Psych:  No change in mood or affect. No depression or anxiety.  No memory loss.   .       Objective:   Physical Exam  amb bf  HEENT: nl dentition, turbinates, and orophanx. Nl external ear canals without cough reflex   NECK :  without JVD/Nodes/TM/ nl carotid upstrokes bilaterally   LUNGS: no acc muscle use, clear to A and P bilaterally without cough  on insp or exp maneuvers   CV:  RRR  no s3 or murmur or increase in P2, no edema   ABD:  soft and nontender with nl excursion in the supine position. No bruits or organomegaly, bowel sounds nl  MS:  warm without deformities, calf tenderness, cyanosis or clubbing  SKIN: warm and dry without lesions    NEURO:  alert, approp, no deficits     07/09/13  No active cardiopulmonary disease. D dimer , hco3 and hct ok      Assessment & Plan:

## 2013-07-28 ENCOUNTER — Ambulatory Visit: Payer: Managed Care, Other (non HMO) | Admitting: Physician Assistant

## 2013-07-28 ENCOUNTER — Telehealth: Payer: Self-pay | Admitting: Internal Medicine

## 2013-07-28 ENCOUNTER — Other Ambulatory Visit: Payer: Self-pay | Admitting: Family

## 2013-07-28 DIAGNOSIS — Z0289 Encounter for other administrative examinations: Secondary | ICD-10-CM

## 2013-07-28 NOTE — Telephone Encounter (Signed)
Spoke with Mel States that they faxed over disability forms earlier today, wanted to check that these were received.  I have requested these be refaxed as I am unsure these were received earlier. Forms to be faxed to triage fax#  Will give to [place] once received. Will hold in triage until forms received

## 2013-07-29 NOTE — Telephone Encounter (Signed)
Forms received and sent to [facility]

## 2013-08-03 ENCOUNTER — Telehealth: Payer: Self-pay | Admitting: *Deleted

## 2013-08-03 NOTE — Telephone Encounter (Signed)
Pt left message requesting results of 07/14/13 CPAP titration test? Also states she has seen pulmonologist since we saw her last.  Please advise.

## 2013-08-04 ENCOUNTER — Encounter: Payer: Self-pay | Admitting: Internal Medicine

## 2013-08-04 ENCOUNTER — Encounter: Payer: Self-pay | Admitting: Emergency Medicine

## 2013-08-04 ENCOUNTER — Ambulatory Visit (INDEPENDENT_AMBULATORY_CARE_PROVIDER_SITE_OTHER): Payer: Managed Care, Other (non HMO) | Admitting: Internal Medicine

## 2013-08-04 VITALS — BP 134/84 | HR 103 | Temp 99.1°F | Ht 62.5 in | Wt 301.0 lb

## 2013-08-04 DIAGNOSIS — R0602 Shortness of breath: Secondary | ICD-10-CM

## 2013-08-04 DIAGNOSIS — R059 Cough, unspecified: Secondary | ICD-10-CM

## 2013-08-04 DIAGNOSIS — R05 Cough: Secondary | ICD-10-CM

## 2013-08-04 MED ORDER — GABAPENTIN 100 MG PO CAPS: 100.0000 mg | ORAL_CAPSULE | Freq: Three times a day (TID) | ORAL | Status: DC

## 2013-08-04 NOTE — Patient Instructions (Signed)
Please see patient coordinator before you leave today  to schedule sinus ct and methacholine challenge  Neurontin 100 mg three times a day    Please schedule a follow up office visit in 4 weeks, sooner if needed

## 2013-08-04 NOTE — Progress Notes (Signed)
Subjective:    Patient ID: Diane Patel, female    DOB: 1980/07/24   MRN: [id number]    Brief patient profile:  45 yobf never smoker needed tonsils out at age 33, humdifier in bedroom and inhalers in HS but eventually outgrew daily need and early 20s 160-185 but still used nhalers= albuterol sev times a year with uri's changed for the worse in May 2015 with chest heaviness and inhalers not working as well and needed more frequently and eventually to ER 6/515 and no better p prednisone and referred 07/21/2013 to pulmonary clinic by Dr Einar Pheasant    History of Present Illness  07/21/2013 1st [facility] office visit/ Diane Patel  Chief Complaint  Patient presents with  . Pulmonary Consult    Referred per Dr. Hassell Done. Pt c/o SOB, cough and CP since May 2015. She is using albuterol inhaler approx 10 times per day.  present symptoms started on inderal which has been stopped Cough is worse with talking and takes her breath away at times  Walking across the lobby sob  Wt up about 20-30 lbs over baseline since onset of symptoms Not able to lie flat comfortably x 6 m, cpap ordered  Last saba 4 h prior to OV   >>   normal spirometry  rx PPI/Pepcid , chlortrimeton q4 As needed,    07/27/2013 Acute OV  Complains of persistent cough, w/  prod cough with yellow mucus, wheezing, dyspnea, hoarseness.   Does feel cough is some improved, but not back to baseline.  Prolonged talking, cold air, change in temp  makes her cough worse.  Previously treated with abx with no improvement.  Spirometry last ov was normal .  Feels chrlotrimeton is helping her throat clearing.  No hemoptysis , chest pain, orthopnea, leg swelling or  Labs last ov ok , D dimer , hco3 and hct  CXR w/ no acute process. NO ambulatory desats.  Not using anything for cough.  Would like would note excusing her from work longer rec Begin Deslym 2 tsp every 12hrs for cough .  Pantoprazole (protonix) 40 mg   Take 30-60 min before first meal of  the day and Pepcid 20 mg one bedtime until return to office - this is the best way to tell whether stomach acid is contributing to your problem.   You may need an alternative to your bcps (DR haygood)  For drainage take chlortrimeton (chlorpheniramine) 4 mg every 4 hours available over the counter (may cause drowsiness) As needed  For drainage  GERD diet  No work until 08/04/13    08/04/2013 f/u ov/Diane Patel re: asthma vs uacs  Chief Complaint  Patient presents with  . Follow-up    Pt reports chest tightness and cough are less since last visit. Her breathing is some better, but not back to her normal baseline. She has been using rescue inhaler once per day on average for the past wk.   back on propranolol and using albuterol when over does it (mopping floor) - last used 5 h prior to OV   Cough with voice use not hs  Still on bcps  No obvious other patterns in day to day or daytime variabilty or assoc cp or chest tightness, subjective wheeze overt sinus or hb symptoms. No unusual exp hx or h/o childhood pna/ asthma or knowledge of premature birth.  Sleeping ok without nocturnal  or early am exacerbation  of respiratory  c/o's or need for noct saba. Also denies any obvious fluctuation  of symptoms with weather or environmental changes or other aggravating or alleviating factors except as outlined above   Current Medications, Allergies, Complete Past Medical History, Past Surgical History, Family History, and Social History were reviewed in [employer] record.  ROS  The following are not active complaints unless bolded sore throat, dysphagia, dental problems, itching, sneezing,  nasal congestion or excess/ purulent secretions, ear ache,   fever, chills, sweats, unintended wt loss, pleuritic or exertional cp, hemoptysis,  orthopnea pnd or leg swelling, presyncope, palpitations, heartburn, abdominal pain, anorexia, nausea, vomiting, diarrhea  or change in bowel or urinary habits,  change in stools or urine, dysuria,hematuria,  rash, arthralgias, visual complaints, headache, numbness weakness or ataxia or problems with walking or coordination,  change in mood/affect or memory.                         Objective:   Physical Exam  amb bf nad  Wt Readings from Last 3 Encounters:  08/04/13 301 lb (136.533 kg)  07/27/13 304 lb (137.893 kg)  07/21/13 302 lb (136.986 kg)     HEENT: nl dentition, turbinates, and orophanx. Nl external ear canals without cough reflex   NECK :  without JVD/Nodes/TM/ nl carotid upstrokes bilaterally   LUNGS: no acc muscle use, clear to A and P bilaterally without cough on insp or exp maneuvers   CV:  RRR  no s3 or murmur or increase in P2, no edema   ABD:  soft and nontender with nl excursion in the supine position. No bruits or organomegaly, bowel sounds nl  MS:  warm without deformities, calf tenderness, cyanosis or clubbing  SKIN: warm and dry without lesions          07/09/13  No active cardiopulmonary disease. D dimer , hco3 and hct ok      Assessment & Plan:

## 2013-08-04 NOTE — Telephone Encounter (Signed)
I have not seen the report. Could you please contact sleep lab and check status of report?

## 2013-08-04 NOTE — Telephone Encounter (Signed)
Diane Patel- could you please contact [facility] and request order for CPAP.  I can fill in the details on form. Thanks.

## 2013-08-04 NOTE — Telephone Encounter (Signed)
Per sleep center, results are in EPIC under chart review, notes tab, 07/19/13 sleep study.  Please advise.

## 2013-08-04 NOTE — Telephone Encounter (Signed)
Notified pt's husband of below information. He confirms that pt does not have CPAP at present and is willing to proceed.

## 2013-08-04 NOTE — Telephone Encounter (Signed)
Notes OSA and recommended CPAP at  13 centimeters.  Does she have a machine at home, if not, I will arrange.

## 2013-08-07 ENCOUNTER — Encounter: Payer: Self-pay | Admitting: Internal Medicine

## 2013-08-07 NOTE — Assessment & Plan Note (Signed)
Echo 06/30/13 mild LVH only  07/21/2013  Walked RA x 3 laps @ 185 ft each stopped due to  End of study no sob or desat  - spirometry wnl 07/21/13   Most likely obesity/ deconditioning - note no worse back on propranolol so doubt this is EIA or any form of asthma esp with the fef25-75 nl but will confirm with MCT to be sure safe to use propranolol here.

## 2013-08-07 NOTE — Assessment & Plan Note (Signed)
Classic Upper airway cough syndrome, so named because it's frequently impossible to sort out how much is  CR/sinusitis with freq throat clearing (which can be related to primary GERD)   vs  causing  secondary (" extra esophageal")  GERD from wide swings in gastric pressure that occur with throat clearing, often  promoting self use of mint and menthol lozenges that reduce the lower esophageal sphincter tone and exacerbate the problem further in a cyclical fashion.   These are the same pts (now being labeled as having "irritable larynx syndrome" by some cough centers) who not infrequently have a history of having failed to tolerate ace inhibitors,  dry powder inhalers or biphosphonates or report having atypical reflux symptoms that don't respond to standard doses of PPI , and are easily confused as having aecopd or asthma flares by even experienced allergists/ pulmonologists.   Will try neurontin while waiting for mct/sinus ct to complete the w/u

## 2013-08-07 NOTE — Assessment & Plan Note (Signed)
Echo 06/30/13 mild LVH only  07/21/2013  Walked RA x 3 laps @ 185 ft each stopped due to  End of study no sob or desat  - spirometry wnl 07/21/13   Symptoms are markedly disproportionate to objective findings and not clear this is a lung problem but pt does appear to have difficult airway management issues. DDX of  difficult airways management all start with A and  include Adherence, Ace Inhibitors, Acid Reflux, Active Sinus Disease, Alpha 1 Antitripsin deficiency, Anxiety masquerading as Airways dz,  ABPA,  allergy(esp in young), Aspiration (esp in elderly), Adverse effects of DPI,  Active smokers, plus two Bs  = Bronchiectasis and Beta blocker use..and one C= CHF  ? Acid (or non-acid) GERD > always difficult to exclude as up to 75% of pts in some series report no assoc GI/ Heartburn symptoms> rec max (24h)  acid suppression and diet restrictions/ reviewed and instructions given in writing.   ? Anxiety /depressio/ vcd all a concern here > dx of exclusion    Each maintenance medication was reviewed in detail including most importantly the difference between maintenance and as needed and under what circumstances the prns are to be used.  Please see instructions for details which were reviewed in writing and the patient given a copy

## 2013-08-11 ENCOUNTER — Inpatient Hospital Stay: Admission: RE | Admit: 2013-08-11 | Payer: Managed Care, Other (non HMO) | Source: Ambulatory Visit

## 2013-08-11 ENCOUNTER — Ambulatory Visit (INDEPENDENT_AMBULATORY_CARE_PROVIDER_SITE_OTHER)
Admission: RE | Admit: 2013-08-11 | Discharge: 2013-08-11 | Disposition: A | Discharge status: Home / Self Care | Payer: Managed Care, Other (non HMO) | Source: Ambulatory Visit | Attending: Internal Medicine | Admitting: Internal Medicine

## 2013-08-11 DIAGNOSIS — R05 Cough: Secondary | ICD-10-CM

## 2013-08-11 DIAGNOSIS — R059 Cough, unspecified: Secondary | ICD-10-CM

## 2013-08-11 NOTE — Progress Notes (Signed)
Quick Note:  Spoke with pt and notified of results per Dr. Wert. Pt verbalized understanding and denied any questions.  ______ 

## 2013-08-16 ENCOUNTER — Encounter: Payer: Self-pay | Admitting: Family

## 2013-08-16 ENCOUNTER — Ambulatory Visit (INDEPENDENT_AMBULATORY_CARE_PROVIDER_SITE_OTHER): Payer: Managed Care, Other (non HMO) | Admitting: Family

## 2013-08-16 VITALS — BP 108/80 | HR 77 | Temp 98.8°F | Resp 16 | Ht 62.5 in | Wt 299.0 lb

## 2013-08-16 DIAGNOSIS — R609 Edema, unspecified: Secondary | ICD-10-CM

## 2013-08-16 NOTE — Progress Notes (Signed)
Visit not completed as pt had to leave prior to being seen.

## 2013-08-16 NOTE — Progress Notes (Signed)
Pre visit review using our clinic review tool, if applicable. No additional management support is needed unless otherwise documented below in the visit note. 

## 2013-08-18 ENCOUNTER — Telehealth: Payer: Self-pay | Admitting: Internal Medicine

## 2013-08-18 ENCOUNTER — Ambulatory Visit (HOSPITAL_COMMUNITY)
Admission: RE | Admit: 2013-08-18 | Discharge: 2013-08-18 | Disposition: A | Discharge status: Home / Self Care | Payer: Managed Care, Other (non HMO) | Source: Ambulatory Visit | Attending: Internal Medicine | Admitting: Internal Medicine

## 2013-08-18 DIAGNOSIS — R059 Cough, unspecified: Secondary | ICD-10-CM

## 2013-08-18 DIAGNOSIS — R05 Cough: Secondary | ICD-10-CM | POA: Insufficient documentation

## 2013-08-18 MED ORDER — ALBUTEROL SULFATE (2.5 MG/3ML) 0.083% IN NEBU
2.5000 mg | INHALATION_SOLUTION | Freq: Once | RESPIRATORY_TRACT | Status: AC
  Administered 2013-08-18: 2.5 mg via RESPIRATORY_TRACT

## 2013-08-18 MED ORDER — METHACHOLINE 4 MG/ML NEB SOLN
2.0000 mL | Freq: Once | RESPIRATORY_TRACT | Status: AC
  Administered 2013-08-18: 8 mg via RESPIRATORY_TRACT

## 2013-08-18 MED ORDER — METHACHOLINE 1 MG/ML NEB SOLN
2.0000 mL | Freq: Once | RESPIRATORY_TRACT | Status: AC
  Administered 2013-08-18: 2 mg via RESPIRATORY_TRACT

## 2013-08-18 MED ORDER — METHACHOLINE 0.0625 MG/ML NEB SOLN
2.0000 mL | Freq: Once | RESPIRATORY_TRACT | Status: AC
  Administered 2013-08-18: 0.125 mg via RESPIRATORY_TRACT

## 2013-08-18 MED ORDER — SODIUM CHLORIDE 0.9 % IN NEBU
3.0000 mL | INHALATION_SOLUTION | Freq: Once | RESPIRATORY_TRACT | Status: AC
  Administered 2013-08-18: 3 mL via RESPIRATORY_TRACT

## 2013-08-18 MED ORDER — METHACHOLINE 16 MG/ML NEB SOLN
2.0000 mL | Freq: Once | RESPIRATORY_TRACT | Status: AC
  Administered 2013-08-18: 32 mg via RESPIRATORY_TRACT

## 2013-08-18 MED ORDER — METHACHOLINE 0.25 MG/ML NEB SOLN
2.0000 mL | Freq: Once | RESPIRATORY_TRACT | Status: AC
  Administered 2013-08-18: 0.5 mg via RESPIRATORY_TRACT

## 2013-08-18 NOTE — Telephone Encounter (Signed)
MW pt stated that she finished her testing today and wanted to see if she needs to wait until her appt with you on 7/29 before she can go back to work.  MW please advise. Thanks  Allergies  Allergen Reactions  . Ace Inhibitors     REACTION: angioedema; lips, throat, tounge swelling  . Cetirizine Hcl   . Flexeril [Cyclobenzaprine] Itching  . Naproxen Itching  . Pineapple Hives    Current Outpatient Prescriptions on File Prior to Visit  Medication Sig Dispense Refill  . albuterol (PROVENTIL HFA;VENTOLIN HFA) 108 (90 BASE) MCG/ACT inhaler Inhale 2 puffs into the lungs every 6 (six) hours as needed for wheezing.  1 Inhaler  0  . chlorpheniramine (CHLOR-TRIMETON) 4 MG tablet Take 4 mg by mouth every 4 (four) hours as needed for allergies.      . famotidine (PEPCID) 20 MG tablet Take 20 mg by mouth at bedtime.      . fluticasone (FLONASE) 50 MCG/ACT nasal spray Place 2 sprays into the nose daily as needed.      . furosemide (LASIX) 20 MG tablet TAKE 1 TABLET BY MOUTH EVERY DAY AS NEEDED  30 tablet  2  . gabapentin (NEURONTIN) 100 MG capsule Take 1 capsule (100 mg total) by mouth 3 (three) times daily. One three times daily  90 capsule  2  . levonorgestrel-ethinyl estradiol (AVIANE,ALESSE,LESSINA) 0.1-20 MG-MCG tablet Take 1 tablet by mouth daily.      . pantoprazole (PROTONIX) 40 MG tablet Take 40 mg by mouth daily.      . propranolol (INDERAL) 10 MG tablet Take 10 mg by mouth 3 (three) times daily.      Marland Kitchen tiZANidine (ZANAFLEX) 4 MG tablet Take 2-4 mg by mouth every 6 (six) hours as needed (Spasms).      . vitamin B-12 (CYANOCOBALAMIN) 1000 MCG tablet Take 1,000 mcg by mouth daily.       No current facility-administered medications on file prior to visit.

## 2013-08-18 NOTE — Telephone Encounter (Signed)
lmomtcb x1 

## 2013-08-18 NOTE — Telephone Encounter (Signed)
Yes just give [place] the day she goes back so we can fill out any paperwork that turns up later

## 2013-08-19 NOTE — Telephone Encounter (Signed)
That's fine

## 2013-08-19 NOTE — Telephone Encounter (Signed)
lmtcb

## 2013-08-19 NOTE — Telephone Encounter (Signed)
Pt returned call & asks to be reached at [phone].  Pt states to leave a detailed message if she does not answer the returned call.  Satira Anis

## 2013-08-19 NOTE — Telephone Encounter (Signed)
I called spoke with Diane Patel. She wants to wait and see if MW will release her back to work on 09/01/13 at her OV. Will make MW aware

## 2013-08-20 ENCOUNTER — Ambulatory Visit: Payer: Managed Care, Other (non HMO) | Admitting: Family

## 2013-08-20 ENCOUNTER — Telehealth: Payer: Self-pay | Admitting: Family

## 2013-08-20 ENCOUNTER — Encounter: Payer: Self-pay | Admitting: Family

## 2013-08-20 DIAGNOSIS — Z0289 Encounter for other administrative examinations: Secondary | ICD-10-CM

## 2013-08-20 NOTE — Telephone Encounter (Signed)
LMOM x 1 

## 2013-08-20 NOTE — Telephone Encounter (Signed)
Pt returned call

## 2013-08-20 NOTE — Telephone Encounter (Signed)
Spoke with patient Pt will wait until OV with MW to return to work (09/01/13) Nothing further needed.

## 2013-08-20 NOTE — Telephone Encounter (Signed)
Please contact pt to reschedule

## 2013-08-20 NOTE — Telephone Encounter (Signed)
Left message for patient to return my call. Letter sent.

## 2013-08-20 NOTE — Telephone Encounter (Signed)
Pt NOS appt today

## 2013-08-23 ENCOUNTER — Telehealth: Payer: Self-pay | Admitting: Internal Medicine

## 2013-08-23 NOTE — Telephone Encounter (Signed)
Called and lmom for rachel from [employer] to call back about these papers.    Called and lmomtcb x 1

## 2013-08-24 NOTE — Telephone Encounter (Signed)
Pt returned all.  Diane Patel

## 2013-08-24 NOTE — Telephone Encounter (Signed)
LMTCB with [employer] again. Per Dr. Melvyn Novas he completed paperwork yesterday and sent it back downstatirs. We need to try to contact [employer] again.   Maricao Bing, CMA

## 2013-08-24 NOTE — Telephone Encounter (Signed)
LMTCB with [employer] again LM (detailed) for pt to contact our office.

## 2013-08-25 NOTE — Telephone Encounter (Signed)
Called [employer] and spoke with Hoyle Sauer. She reports they received her paperwork on 08/18/13. The paperwork was faxed to [facility] yesterday per Hoyle Sauer.   Called pt and LMTCB

## 2013-08-26 NOTE — Telephone Encounter (Signed)
Pt returned call.  Per [name], ok to tell pt paperwork was faxed to [facility] yesterday.  Pt verbalized understanding & states nothing further needed at this time.  Diane Patel

## 2013-08-26 NOTE — Telephone Encounter (Signed)
LMTCBx2 to advise the pt that paperwork was faxed to [facility] yesterday. Brownfield Bing, CMA

## 2013-08-30 ENCOUNTER — Telehealth: Payer: Self-pay | Admitting: Internal Medicine

## 2013-08-30 NOTE — Telephone Encounter (Signed)
Pt calling requesting that we refax recent office notes from visit with Dr Melvyn Novas Per pt, [facility] is stating that they received the disability paperwork but did not receive requested OV notes with treatment plan. These records has been refaxed to [facility] (F#) [phone] Nothing further needed.

## 2013-09-01 ENCOUNTER — Other Ambulatory Visit: Payer: Managed Care, Other (non HMO)

## 2013-09-01 ENCOUNTER — Ambulatory Visit (INDEPENDENT_AMBULATORY_CARE_PROVIDER_SITE_OTHER): Payer: Managed Care, Other (non HMO) | Admitting: Internal Medicine

## 2013-09-01 ENCOUNTER — Encounter (INDEPENDENT_AMBULATORY_CARE_PROVIDER_SITE_OTHER): Payer: Self-pay

## 2013-09-01 ENCOUNTER — Encounter: Payer: Self-pay | Admitting: Internal Medicine

## 2013-09-01 VITALS — BP 124/62 | HR 78 | Temp 98.7°F | Ht 63.0 in | Wt 292.0 lb

## 2013-09-01 DIAGNOSIS — R058 Other specified cough: Secondary | ICD-10-CM

## 2013-09-01 DIAGNOSIS — R05 Cough: Secondary | ICD-10-CM

## 2013-09-01 DIAGNOSIS — R059 Cough, unspecified: Secondary | ICD-10-CM

## 2013-09-01 MED ORDER — GABAPENTIN 300 MG PO CAPS: 300.0000 mg | ORAL_CAPSULE | Freq: Three times a day (TID) | ORAL | Status: DC

## 2013-09-01 NOTE — Progress Notes (Signed)
Subjective:    Patient ID: Diane Patel, female    DOB: September 01, 1980   MRN: [id number]    Brief patient profile:  33 yobf never smoker needed tonsils out at age 33, humdifier in bedroom and inhalers in HS but eventually outgrew daily need and early 20s 160-185 but still used inhalers= albuterol sev times a year with uri's changed for the worse in May 2015 with chest heaviness and inhalers not working as well and needed more frequently and eventually to ER 6/515 and no better p prednisone and referred 07/21/2013 to pulmonary clinic by Dr Einar Pheasant    History of Present Illness  07/21/2013 1st [facility] office visit/ Diane Patel  Chief Complaint  Patient presents with  . Pulmonary Consult    Referred per Dr. Hassell Done. Pt c/o SOB, cough and CP since May 2015. She is using albuterol inhaler approx 10 times per day.  present symptoms started on inderal which has been stopped Cough is worse with talking and takes her breath away at times  Walking across the lobby sob  Wt up about 20-30 lbs over baseline since onset of symptoms Not able to lie flat comfortably x 6 m, cpap ordered  Last saba 4 h prior to OV   >>   normal spirometry  rx PPI/Pepcid , chlortrimeton q4 As needed,    07/27/2013 Acute OV  Complains of persistent cough, w/  prod cough with yellow mucus, wheezing, dyspnea, hoarseness.   Does feel cough is some improved, but not back to baseline.  Prolonged talking, cold air, change in temp  makes her cough worse.  Previously treated with abx with no improvement.  Spirometry last ov was normal .  Feels chrlotrimeton is helping her throat clearing.  No hemoptysis , chest pain, orthopnea, leg swelling or  Labs last ov ok , D dimer , hco3 and hct  CXR w/ no acute process. NO ambulatory desats.  Not using anything for cough.  Would like would note excusing her from work longer rec Begin Deslym 2 tsp every 12hrs for cough .  Pantoprazole (protonix) 40 mg   Take 30-60 min before first meal of  the day and Pepcid 20 mg one bedtime until return to office - this is the best way to tell whether stomach acid is contributing to your problem.   You may need an alternative to your bcps (DR haygood)  For drainage take chlortrimeton (chlorpheniramine) 4 mg every 4 hours available over the counter (may cause drowsiness) As needed  For drainage  GERD diet  No work until 08/04/13    08/04/2013 f/u ov/Diane Patel re: asthma vs uacs  Chief Complaint  Patient presents with  . Follow-up    Pt reports chest tightness and cough are less since last visit. Her breathing is some better, but not back to her normal baseline. She has been using rescue inhaler once per day on average for the past wk.   back on propranolol and using albuterol when over does it (mopping floor) - last used 5 h prior to OV   Cough with voice use not hs  Still on bcps rec Please see patient coordinator before you leave today  to schedule sinus ct and methacholine challenge Neurontin 100 mg three times a day    08/18/13 MCT tight/ sob/ some cough > better p saba not clear this reproduced her symptoms   09/01/2013 f/u ov/Diane Patel re: cough x May 2015  Chief Complaint  Patient presents with  . Follow-up  Cough has improved since last visit. Cough still occ prod with yellow sputum.  Breathing has also improved. She c/o sore throat on and off since her last visit.   throat pain since last ov up to 3 days at a time s variation time of day or eating.  Sometimes has it for For  A  Few hours - still not able to use her voice Cough gone on neurontin 100 tid but still sense she needs to clear her throat. Not using saba at present    No obvious other patterns in day to day or daytime variabilty or assoc cp or chest tightness, subjective wheeze overt sinus or hb symptoms. No unusual exp hx or h/o childhood pna/ asthma or knowledge of premature birth.  Sleeping ok without nocturnal  or early am exacerbation  of respiratory  c/o's or need for noct  saba. Also denies any obvious fluctuation of symptoms with weather or environmental changes or other aggravating or alleviating factors except as outlined above   Current Medications, Allergies, Complete Past Medical History, Past Surgical History, Family History, and Social History were reviewed in [employer] record.  ROS  The following are not active complaints unless bolded sore throat, dysphagia, dental problems, itching, sneezing,  nasal congestion or excess/ purulent secretions, ear ache,   fever, chills, sweats, unintended wt loss, pleuritic or exertional cp, hemoptysis,  orthopnea pnd or leg swelling, presyncope, palpitations, heartburn, abdominal pain, anorexia, nausea, vomiting, diarrhea  or change in bowel or urinary habits, change in stools or urine, dysuria,hematuria,  rash, arthralgias, visual complaints, headache, numbness weakness or ataxia or problems with walking or coordination,  change in mood/affect or memory.                         Objective:   Physical Exam  amb bf nad  09/01/13             292 Wt Readings from Last 3 Encounters:  08/04/13 301 lb (136.533 kg)  07/27/13 304 lb (137.893 kg)  07/21/13 302 lb (136.986 kg)     HEENT: nl dentition, turbinates, and orophanx. Nl external ear canals without cough reflex   NECK :  without JVD/Nodes/TM/ nl carotid upstrokes bilaterally   LUNGS: no acc muscle use, clear to A and P bilaterally without cough on insp or exp maneuvers   CV:  RRR  no s3 or murmur or increase in P2, no edema   ABD:  soft and nontender with nl excursion in the supine position. No bruits or organomegaly, bowel sounds nl  MS:  warm without deformities, calf tenderness, cyanosis or clubbing  SKIN: warm and dry without lesions          07/09/13  No active cardiopulmonary disease. D dimer , hco3 and hct ok      Assessment & Plan:

## 2013-09-01 NOTE — Patient Instructions (Addendum)
Increase neurontin to 300 mg three times a day.  Chlortrimeton 4 mg one every 4 h for cough, sorethroat, tickle in throat, drainage, dripping, sneezing etc   Please see patient coordinator before you leave today  to schedule ENT eval and they will cover you for any missed work after you establish  Please remember to go to the lab  department downstairs for your tests - we will call you with the results when they are available.     See Tammy NP in 6  weeks with all your medications, even over the counter meds, separated in two separate bags, the ones you take no matter what vs the ones you stop once you feel better and take only as needed when you feel you need them.   Tammy  will generate for you a new user friendly medication calendar that will put [place] all on the same page re: your medication use.     Without this process, it simply isn't possible to assure that we are providing  your outpatient care  with  the attention to detail we feel you deserve.   If we cannot assure that you're getting that kind of care,  then we cannot manage your problem effectively from this clinic.  Once you have seen Tammy and we are sure that we're all on the same page with your medication use she will arrange follow up with me.  Late add : IgE 169 so added singulair 10 mg daily

## 2013-09-02 ENCOUNTER — Telehealth: Payer: Self-pay | Admitting: *Deleted

## 2013-09-02 ENCOUNTER — Encounter: Payer: Self-pay | Admitting: Internal Medicine

## 2013-09-02 LAB — ALLERGY FULL PROFILE
Allergen, D pternoyssinus,d7: <0.1 kU/L
Alternaria Alternata: 4.13 kU/L — ABNORMAL HIGH
Aspergillus fumigatus, m3: 0.11 kU/L — ABNORMAL HIGH
Bahia Grass: <0.1 kU/L
Bermuda Grass: <0.1 kU/L
Candida Albicans: 0.11 kU/L — ABNORMAL HIGH
Cat Dander: <0.1 kU/L
Common Ragweed: <0.1 kU/L
Curvularia lunata: 1.08 kU/L — ABNORMAL HIGH
D. farinae: <0.1 kU/L
Elm IgE: <0.1 kU/L
Fescue: <0.1 kU/L
G009 Red Top: <0.1 kU/L
HELMINTHOSPORIUM HALODES: 1.24 kU/L — AB
House Dust Hollister: <0.1 kU/L
IGE (IMMUNOGLOBULIN E), SERUM: 169.1 [IU]/mL (ref 0.0–180.0)
Lamb's Quarters: <0.1 kU/L
Oak: <0.1 kU/L
Plantain: <0.1 kU/L
STEMPHYLIUM BOTRYOSUM: 2.39 kU/L — AB
Sycamore Tree: <0.1 kU/L

## 2013-09-02 NOTE — Telephone Encounter (Signed)
Message copied by Rosana Berger on Thu Sep 02, 2013  9:07 AM ------      Message from: Christinia Gully B      Created: Thu Sep 02, 2013  6:39 AM       Find out how much she's using her saba and rec she start sigulair and see if how she does and whether reduces saba need by time she returns            10 mg each pm x 3 months only  ------

## 2013-09-02 NOTE — Progress Notes (Signed)
Quick Note:  LMTCB ______ 

## 2013-09-02 NOTE — Telephone Encounter (Signed)
LMTCB

## 2013-09-02 NOTE — Assessment & Plan Note (Addendum)
Unable to work 07/19/13 >>> neurontin trial 100 mg tid started 08/04/2013 > improved  Sinus CT 08/04/2013 > neg  MCT from 08/04/13 pending but sounds pos/ clinically equivocal so hold inhalers which may make upper airway symptoms worse.  Her main problem with working now is all related to [employer] and voice fatigue, not asthma. Will ask her to see ent and in meantime try gabapentin 300 tid and encourage her not to clear her throat  Elevated IgE suggests allergic component > add singulair

## 2013-09-06 MED ORDER — MONTELUKAST SODIUM 10 MG PO TABS: 10.0000 mg | ORAL_TABLET | Freq: Every day | ORAL | Status: DC

## 2013-09-06 NOTE — Telephone Encounter (Signed)
Spoke with pt and notified of results per Dr. Melvyn Novas. Pt verbalized understanding and denied any questions. Rx for singulair was sent to pharm

## 2013-09-14 ENCOUNTER — Encounter: Payer: Self-pay | Admitting: Family

## 2013-09-14 ENCOUNTER — Ambulatory Visit (INDEPENDENT_AMBULATORY_CARE_PROVIDER_SITE_OTHER): Payer: Managed Care, Other (non HMO) | Admitting: Family

## 2013-09-14 VITALS — BP 108/80 | HR 90 | Temp 98.3°F | Resp 16 | Ht 62.5 in | Wt 292.1 lb

## 2013-09-14 DIAGNOSIS — G4733 Obstructive sleep apnea (adult) (pediatric): Secondary | ICD-10-CM | POA: Insufficient documentation

## 2013-09-14 DIAGNOSIS — R0602 Shortness of breath: Secondary | ICD-10-CM

## 2013-09-14 NOTE — Assessment & Plan Note (Addendum)
Reviewed CPAP titration study- was corrected by CPAP of 13 centimeters with a small fullface mask. Titration was optimal. Will arrange.  I think that treatment of her sleep apnea with improve her overall symptoms significantly.  We also discussed the importance of weight loss.

## 2013-09-14 NOTE — Progress Notes (Signed)
Subjective:    Patient ID: Diane Patel, female    DOB: 1980-06-26, 33 y.o.   MRN: [id number]  HPI  Diane Patel is a 33 yr old female who presents today to discuss 2 concerns:  1) SOB- she is following with Dr. Melvyn Novas who is following her for upper airway cough syndrome. He noted that her issue with working is mainly related to voice hoarseness and voice fatigue and not asthma. He did add singulair. Of note, she did have a 2D echo on 06/30/13 which showed normal cardiac function. She reports that overall her shortness of breath has improved.   2) OSA- wakes up very tired.    3) Edema- reports that swelling comes and goes.  It is better than it was.    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  Has left sided jaw pain.   Review of Systems    see HPI  Past Medical History  Diagnosis Date  . Asthma   . Depression   . Headaches, cluster     frequent  . Allergy   . Hyperlipidemia   . Obesity, morbid   . Back pain     History   Social History  . Marital Status: Married    Spouse Name: N/A    Number of Children: 0  . Years of Education: N/A   Occupational History  . unemployed    Social History Main Topics  . Smoking status: Never Smoker   . Smokeless tobacco: Never Used  . Alcohol Use: No  . Drug Use: No  . Sexual Activity: Not on file   Other Topics Concern  . Not on file   Social History Narrative   Regular excercise    Past Surgical History  Procedure Laterality Date  . Appendectomy  2004  . Cholecystectomy  2010  . Tonsillectomy and adenoidectomy  1985    Family History  Problem Relation Age of Onset  . Hypertension Mother   . Diabetes Mother   . Heart disease Mother   . Polycystic ovary syndrome Mother   . Diabetes Father   . Hypertension Father   . Diabetes Sister   . Pancreatitis Sister   . Cancer Maternal Grandmother     ? stomach cancer  . Diabetes Maternal Grandmother   . Cancer Maternal Grandfather     bone  . Glaucoma Maternal  Grandfather   . Osteoporosis Maternal Grandfather   . Hypertension Paternal Grandfather   . Mental illness Paternal Grandfather   . Emphysema Father     smoked  . Asthma Brother     Allergies  Allergen Reactions  . Ace Inhibitors     REACTION: angioedema; lips, throat, tounge swelling  . Cetirizine Hcl   . Flexeril [Cyclobenzaprine] Itching  . Naproxen Itching  . Pineapple Hives    Current Outpatient Prescriptions on File Prior to Visit  Medication Sig Dispense Refill  . chlorpheniramine (CHLOR-TRIMETON) 4 MG tablet Take 4 mg by mouth every 4 (four) hours as needed for allergies.      . famotidine (PEPCID) 20 MG tablet Take 20 mg by mouth at bedtime.      . fluticasone (FLONASE) 50 MCG/ACT nasal spray Place 2 sprays into the nose daily as needed.      . furosemide (LASIX) 20 MG tablet TAKE 1 TABLET BY MOUTH EVERY DAY AS NEEDED  30 tablet  2  . gabapentin (NEURONTIN) 300 MG capsule Take 1 capsule (300 mg total) by mouth 3 (  three) times daily.  90 capsule  11  . levonorgestrel-ethinyl estradiol (AVIANE,ALESSE,LESSINA) 0.1-20 MG-MCG tablet Take 1 tablet by mouth daily.      . montelukast (SINGULAIR) 10 MG tablet Take 1 tablet (10 mg total) by mouth at bedtime.  30 tablet  2  . tiZANidine (ZANAFLEX) 4 MG tablet Take 2-4 mg by mouth every 6 (six) hours as needed (Spasms).      . vitamin B-12 (CYANOCOBALAMIN) 1000 MCG tablet Take 1,000 mcg by mouth daily.       No current facility-administered medications on file prior to visit.    BP 108/80  Pulse 90  Temp(Src) 98.3 F (36.8 C) (Oral)  Resp 16  Ht 5' 2.5" (1.588 m)  Wt 292 lb 1.9 oz (132.505 kg)  BMI 52.54 kg/m2  SpO2 99%  LMP 08/09/2013     Objective:   Physical Exam  Constitutional: She is oriented to person, place, and time. She appears well-developed and well-nourished. No distress.  HENT:  Head: Normocephalic and atraumatic.  Cardiovascular: Normal rate and regular rhythm.   No murmur heard. Pulmonary/Chest:  Effort normal. No respiratory distress. She has no wheezes. She has no rales. She exhibits no tenderness.  Neurological: She is alert and oriented to person, place, and time.  Skin: Skin is warm and dry.  Psychiatric: She has a normal mood and affect. Her behavior is normal. Judgment normal.          Assessment & Plan:

## 2013-09-14 NOTE — Progress Notes (Signed)
Pre visit review using our clinic review tool, if applicable. No additional management support is needed unless otherwise documented below in the visit note. 

## 2013-09-14 NOTE — Patient Instructions (Signed)
You will be contacted about your home CPAP. Follow up in 2 months.  Avoid driving if drowsy.

## 2013-09-14 NOTE — Assessment & Plan Note (Signed)
Improved

## 2013-09-15 ENCOUNTER — Encounter (HOSPITAL_COMMUNITY): Payer: Managed Care, Other (non HMO)

## 2013-09-16 ENCOUNTER — Encounter (HOSPITAL_COMMUNITY): Payer: Managed Care, Other (non HMO)

## 2013-09-22 ENCOUNTER — Encounter: Payer: Self-pay | Admitting: Internal Medicine

## 2013-09-22 ENCOUNTER — Other Ambulatory Visit (HOSPITAL_COMMUNITY): Payer: Self-pay | Admitting: Respiratory Therapy

## 2013-09-22 ENCOUNTER — Ambulatory Visit (HOSPITAL_COMMUNITY)
Admission: RE | Admit: 2013-09-22 | Discharge: 2013-09-22 | Disposition: A | Discharge status: Home / Self Care | Payer: Managed Care, Other (non HMO) | Source: Ambulatory Visit | Attending: Internal Medicine | Admitting: Internal Medicine

## 2013-09-22 DIAGNOSIS — J45909 Unspecified asthma, uncomplicated: Secondary | ICD-10-CM

## 2013-09-22 LAB — PULMONARY FUNCTION TEST
FEF 25-75 Post: 2.55 L/sec
FEF 25-75 Pre: 3.29 L/sec
FEF2575-%Change-Post: -22 %
FEF2575-%PRED-PRE: 111 %
FEF2575-%Pred-Post: 86 %
FEV1-%Change-Post: -5 %
FEV1-%Pred-Post: 79 %
FEV1-%Pred-Pre: 84 %
FEV1-Post: 2.04 L
FEV1-Pre: 2.15 L
FEV1FVC-%Change-Post: -3 %
FEV1FVC-%Pred-Pre: 104 %
FEV6-%Change-Post: -2 %
FEV6-%PRED-POST: 79 %
FEV6-%PRED-PRE: 81 %
FEV6-Post: 2.37 L
FEV6-Pre: 2.42 L
FEV6FVC-%Pred-Post: 102 %
FEV6FVC-%Pred-Pre: 102 %
FVC-%Change-Post: -2 %
FVC-%Pred-Post: 77 %
FVC-%Pred-Pre: 79 %
FVC-Post: 2.37 L
FVC-Pre: 2.42 L
POST FEV6/FVC RATIO: 100 %
Post FEV1/FVC ratio: 86 %
Pre FEV1/FVC ratio: 89 %
Pre FEV6/FVC Ratio: 100 %

## 2013-09-22 MED ORDER — METHACHOLINE 0.0625 MG/ML NEB SOLN
2.0000 mL | Freq: Once | RESPIRATORY_TRACT | Status: AC
  Administered 2013-09-22: 0.125 mg via RESPIRATORY_TRACT

## 2013-09-22 MED ORDER — METHACHOLINE 16 MG/ML NEB SOLN
2.0000 mL | Freq: Once | RESPIRATORY_TRACT | Status: AC
  Administered 2013-09-22: 32 mg via RESPIRATORY_TRACT

## 2013-09-22 MED ORDER — ALBUTEROL SULFATE (2.5 MG/3ML) 0.083% IN NEBU
2.5000 mg | INHALATION_SOLUTION | Freq: Once | RESPIRATORY_TRACT | Status: AC
  Administered 2013-09-22: 2.5 mg via RESPIRATORY_TRACT

## 2013-09-22 MED ORDER — METHACHOLINE 4 MG/ML NEB SOLN
2.0000 mL | Freq: Once | RESPIRATORY_TRACT | Status: AC
Start: 2013-09-22 — End: 2013-09-22
  Administered 2013-09-22: 8 mg via RESPIRATORY_TRACT

## 2013-09-22 MED ORDER — METHACHOLINE 1 MG/ML NEB SOLN
2.0000 mL | Freq: Once | RESPIRATORY_TRACT | Status: AC
  Administered 2013-09-22: 2 mg via RESPIRATORY_TRACT

## 2013-09-22 MED ORDER — SODIUM CHLORIDE 0.9 % IN NEBU
3.0000 mL | INHALATION_SOLUTION | Freq: Once | RESPIRATORY_TRACT | Status: AC
  Administered 2013-09-22: 3 mL via RESPIRATORY_TRACT

## 2013-09-22 MED ORDER — METHACHOLINE 0.25 MG/ML NEB SOLN
2.0000 mL | Freq: Once | RESPIRATORY_TRACT | Status: AC
  Administered 2013-09-22: 0.5 mg via RESPIRATORY_TRACT

## 2013-09-23 ENCOUNTER — Encounter (HOSPITAL_COMMUNITY): Payer: Self-pay | Admitting: Emergency Medicine

## 2013-09-23 ENCOUNTER — Emergency Department (HOSPITAL_COMMUNITY)
Admission: EM | Admit: 2013-09-23 | Discharge: 2013-09-23 | Disposition: A | Discharge status: Home / Self Care | Payer: Managed Care, Other (non HMO) | Attending: Emergency Medicine | Admitting: Emergency Medicine

## 2013-09-23 DIAGNOSIS — Z8659 Personal history of other mental and behavioral disorders: Secondary | ICD-10-CM | POA: Diagnosis not present

## 2013-09-23 DIAGNOSIS — J45909 Unspecified asthma, uncomplicated: Secondary | ICD-10-CM | POA: Diagnosis not present

## 2013-09-23 DIAGNOSIS — R6884 Jaw pain: Secondary | ICD-10-CM | POA: Diagnosis present

## 2013-09-23 DIAGNOSIS — Z8669 Personal history of other diseases of the nervous system and sense organs: Secondary | ICD-10-CM | POA: Insufficient documentation

## 2013-09-23 DIAGNOSIS — M26629 Arthralgia of temporomandibular joint, unspecified side: Secondary | ICD-10-CM

## 2013-09-23 DIAGNOSIS — Z87828 Personal history of other (healed) physical injury and trauma: Secondary | ICD-10-CM | POA: Diagnosis not present

## 2013-09-23 DIAGNOSIS — Z79899 Other long term (current) drug therapy: Secondary | ICD-10-CM | POA: Insufficient documentation

## 2013-09-23 DIAGNOSIS — IMO0002 Reserved for concepts with insufficient information to code with codable children: Secondary | ICD-10-CM | POA: Diagnosis not present

## 2013-09-23 MED ORDER — METHOCARBAMOL 500 MG PO TABS
500.0000 mg | ORAL_TABLET | Freq: Once | ORAL | Status: AC
  Administered 2013-09-23: 500 mg via ORAL
  Filled 2013-09-23: qty 1

## 2013-09-23 MED ORDER — HYDROCODONE-ACETAMINOPHEN 5-325 MG PO TABS: 1.0000 | ORAL_TABLET | ORAL | Status: DC | PRN

## 2013-09-23 MED ORDER — OXYCODONE-ACETAMINOPHEN 5-325 MG PO TABS
1.0000 | ORAL_TABLET | Freq: Once | ORAL | Status: AC
  Administered 2013-09-23: 1 via ORAL
  Filled 2013-09-23: qty 1

## 2013-09-23 MED ORDER — METHOCARBAMOL 500 MG PO TABS: 500.0000 mg | ORAL_TABLET | Freq: Two times a day (BID) | ORAL | Status: DC

## 2013-09-23 NOTE — Discharge Instructions (Signed)
Take the prescribed medication as directed.  Continue with soft diet and warm compresses. Follow-up with your dentist. Return to the ED for new or worsening symptoms.

## 2013-09-23 NOTE — ED Provider Notes (Signed)
CSN: [id number]     Arrival date & time 09/23/13  1055 History   First MD Initiated Contact with Patient 09/23/13 1114     Chief Complaint  Patient presents with  . Jaw Pain     (Consider location/radiation/quality/duration/timing/severity/associated sxs/prior Treatment) The history is provided by the patient and medical records.   This is a 33 year old female with past medical history significant for asthma, depression, TMJ, presenting to me for left upper and lower jaw pain. Patient states pain has been present for the past 3 days, described as an intermittent sharp, shooting pain into her left ear. States she's been seeing her dentist for this issue--- has a mouth guard, has been eating soft diet, and applying warm compresses without relief.  Denies fever, chills, sweats.  No difficulty swallowing.  Patient sees [facility].  Past Medical History  Diagnosis Date  . Asthma   . Depression   . Headaches, cluster     frequent  . Allergy   . Hyperlipidemia   . Obesity, morbid   . Back pain    Past Surgical History  Procedure Laterality Date  . Appendectomy  2004  . Cholecystectomy  2010  . Tonsillectomy and adenoidectomy  1985   Family History  Problem Relation Age of Onset  . Hypertension Mother   . Diabetes Mother   . Heart disease Mother   . Polycystic ovary syndrome Mother   . Diabetes Father   . Hypertension Father   . Diabetes Sister   . Pancreatitis Sister   . Cancer Maternal Grandmother     ? stomach cancer  . Diabetes Maternal Grandmother   . Cancer Maternal Grandfather     bone  . Glaucoma Maternal Grandfather   . Osteoporosis Maternal Grandfather   . Hypertension Paternal Grandfather   . Mental illness Paternal Grandfather   . Emphysema Father     smoked  . Asthma Brother    History  Substance Use Topics  . Smoking status: Never Smoker   . Smokeless tobacco: Never Used  . Alcohol Use: No   OB History   Grav Para Term Preterm Abortions TAB  SAB Ect Mult Living                 Review of Systems  HENT: Positive for dental problem.   All other systems reviewed and are negative.   Allergies  Ace inhibitors; Cetirizine hcl; Flexeril; Naproxen; and Pineapple  Home Medications   Prior to Admission medications   Medication Sig Start Date End Date Taking? Authorizing Provider  chlorpheniramine (CHLOR-TRIMETON) 4 MG tablet Take 4 mg by mouth every 4 (four) hours as needed for allergies.    Historical Provider, MD  famotidine (PEPCID) 20 MG tablet Take 20 mg by mouth at bedtime.    Historical Provider, MD  fluticasone (FLONASE) 50 MCG/ACT nasal spray Place 2 sprays into the nose daily as needed. 09/23/12   Brunetta Jeans, PA-C  furosemide (LASIX) 20 MG tablet TAKE 1 TABLET BY MOUTH EVERY DAY AS NEEDED 07/28/13   Debbrah Alar, NP  gabapentin (NEURONTIN) 300 MG capsule Take 1 capsule (300 mg total) by mouth 3 (three) times daily. 09/01/13   Tanda Rockers, MD  levonorgestrel-ethinyl estradiol (AVIANE,ALESSE,LESSINA) 0.1-20 MG-MCG tablet Take 1 tablet by mouth daily.    Historical Provider, MD  montelukast (SINGULAIR) 10 MG tablet Take 1 tablet (10 mg total) by mouth at bedtime. 09/06/13   Tanda Rockers, MD  pantoprazole (PROTONIX) 40 MG tablet Take  40 mg by mouth daily. 08/17/13   Historical Provider, MD  tiZANidine (ZANAFLEX) 4 MG tablet Take 2-4 mg by mouth every 6 (six) hours as needed (Spasms).    Historical Provider, MD  vitamin B-12 (CYANOCOBALAMIN) 1000 MCG tablet Take 1,000 mcg by mouth daily.    Historical Provider, MD   BP 145/96  Pulse 96  Temp(Src) 98.6 F (37 C) (Oral)  Resp 22  SpO2 100%  LMP 08/09/2013  Physical Exam  Nursing note and vitals reviewed. Constitutional: She is oriented to person, place, and time. She appears well-developed and well-nourished. No distress.  HENT:  Head: Normocephalic and atraumatic.  Mouth/Throat: Oropharynx is clear and moist.  Teeth largely in good dentition, teeth  non-tender, surrounding gingiva normal in appearance without signs of dental abscess, handling secretions appropriately, no trismus; jaw shifts to right slightly when opening and closing, no popping/clicking noted  Eyes: Conjunctivae and EOM are normal. Pupils are equal, round, and reactive to light.  Neck: Normal range of motion. Neck supple.  Cardiovascular: Normal rate, regular rhythm and normal heart sounds.   Pulmonary/Chest: Effort normal and breath sounds normal. No respiratory distress. She has no wheezes.  Musculoskeletal: Normal range of motion.  Neurological: She is alert and oriented to person, place, and time.  Skin: Skin is warm and dry. She is not diaphoretic.  Psychiatric: She has a normal mood and affect.    ED Course  Procedures (including critical care time) Labs Review Labs Reviewed - No data to display  Imaging Review No results found.   EKG Interpretation None      MDM   Final diagnoses:  TMJ arthralgia   Sx consistent with TMJ exacerbation.  No signs of dental abscess on exam, pt afebrile and non-toxic appearing.  Given dose of percocet and robaxin, d/c home with same.  Recommended continue soft diet and warm compresses.  FU with dentist.  Discussed plan with patient, he/she acknowledged understanding and agreed with plan of care.  Return precautions given for new or worsening symptoms.  Larene Pickett, PA-C 09/23/13 1257

## 2013-09-23 NOTE — ED Provider Notes (Signed)
Medical screening examination/treatment/procedure(s) were performed by non-physician practitioner and as supervising physician I was immediately available for consultation/collaboration.   EKG Interpretation None       Threasa Beards, MD 09/23/13 1306

## 2013-09-23 NOTE — ED Notes (Signed)
Pt sts that she has been having upper/lower jaw pain described as sharp shooting through L ear. Pt attempted to contact dentist without success. Pt does have TMJ.

## 2013-10-12 ENCOUNTER — Encounter: Payer: Managed Care, Other (non HMO) | Admitting: Adult Health

## 2013-10-19 ENCOUNTER — Telehealth: Payer: Self-pay | Admitting: Internal Medicine

## 2013-10-19 NOTE — Telephone Encounter (Signed)
LMOM TCB x1. Were these taken to [employer]? ATC Carolyn in [place] to ask, but no answer.

## 2013-10-21 NOTE — Telephone Encounter (Signed)
Spoke with Diane Patel in [place] has not had any papers on patient since July 2015; also spoke with Magda Paganini who states any paperwork for this patient goes to [employer].   I left message for patient to call us back-she what she is referring to.

## 2013-10-25 NOTE — Telephone Encounter (Signed)
lmomtcb x1 

## 2013-10-26 NOTE — Telephone Encounter (Signed)
ATC patient-mailbox is full and unable to leave any messages at this time.

## 2013-10-27 NOTE — Telephone Encounter (Signed)
LMTCB x2  

## 2013-10-28 NOTE — Telephone Encounter (Signed)
lmtcb x3 

## 2013-11-15 ENCOUNTER — Telehealth: Payer: Self-pay | Admitting: *Deleted

## 2013-11-15 ENCOUNTER — Ambulatory Visit: Payer: Managed Care, Other (non HMO) | Admitting: Family

## 2013-11-15 DIAGNOSIS — Z0289 Encounter for other administrative examinations: Secondary | ICD-10-CM

## 2013-11-15 NOTE — Telephone Encounter (Signed)
Pt did not show for appointment 11/15/2013 at 8:15am for 2 month follow up

## 2013-11-16 ENCOUNTER — Encounter: Payer: Self-pay | Admitting: Family

## 2013-11-18 ENCOUNTER — Telehealth: Payer: Self-pay | Admitting: Family

## 2013-11-18 NOTE — Telephone Encounter (Signed)
Dismissal Letter sent by Certified Mail [id number]  Dismissal Letter returned Unclaimed 01/11/2014  Dismissal Letter sent by [employer] Mail 01/11/2014

## 2014-01-04 DIAGNOSIS — G4733 Obstructive sleep apnea (adult) (pediatric): Secondary | ICD-10-CM

## 2014-01-04 HISTORY — DX: Obstructive sleep apnea (adult) (pediatric): G47.33

## 2014-01-17 ENCOUNTER — Telehealth: Payer: Self-pay | Admitting: *Deleted

## 2014-01-17 NOTE — Telephone Encounter (Signed)
Received medical record request via fax from [facility]. Forms forwarded to [facility]. JG//CMA

## 2014-03-31 ENCOUNTER — Encounter: Payer: Self-pay | Admitting: Neurology

## 2014-04-25 ENCOUNTER — Institutional Professional Consult (permissible substitution): Payer: Self-pay | Admitting: Neurology

## 2014-04-26 ENCOUNTER — Encounter: Payer: Self-pay | Admitting: Neurology

## 2014-04-27 ENCOUNTER — Encounter: Payer: Self-pay | Admitting: Neurology

## 2014-04-27 ENCOUNTER — Ambulatory Visit (INDEPENDENT_AMBULATORY_CARE_PROVIDER_SITE_OTHER): Payer: Managed Care, Other (non HMO) | Admitting: Neurology

## 2014-04-27 VITALS — BP 125/83 | HR 89 | Resp 14 | Ht 63.5 in | Wt 300.4 lb

## 2014-04-27 DIAGNOSIS — G4733 Obstructive sleep apnea (adult) (pediatric): Secondary | ICD-10-CM | POA: Diagnosis not present

## 2014-04-27 DIAGNOSIS — R351 Nocturia: Secondary | ICD-10-CM

## 2014-04-27 DIAGNOSIS — Z0289 Encounter for other administrative examinations: Secondary | ICD-10-CM

## 2014-04-27 DIAGNOSIS — G4763 Sleep related bruxism: Secondary | ICD-10-CM | POA: Diagnosis not present

## 2014-04-27 DIAGNOSIS — R51 Headache: Secondary | ICD-10-CM | POA: Diagnosis not present

## 2014-04-27 DIAGNOSIS — E662 Morbid (severe) obesity with alveolar hypoventilation: Secondary | ICD-10-CM

## 2014-04-27 DIAGNOSIS — R519 Headache, unspecified: Secondary | ICD-10-CM | POA: Insufficient documentation

## 2014-04-27 MED ORDER — CARBAMAZEPINE 100 MG PO CHEW: 100.0000 mg | CHEWABLE_TABLET | Freq: Two times a day (BID) | ORAL | Status: DC
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
## 2014-05-05 ENCOUNTER — Other Ambulatory Visit: Payer: Self-pay | Admitting: Family Medicine

## 2014-05-05 DIAGNOSIS — Z1231 Encounter for screening mammogram for malignant neoplasm of breast: Secondary | ICD-10-CM

## 2014-06-29 ENCOUNTER — Telehealth: Payer: Self-pay

## 2014-06-29 ENCOUNTER — Ambulatory Visit: Payer: Managed Care, Other (non HMO) | Admitting: Neurology

## 2014-06-29 NOTE — Telephone Encounter (Signed)
Pt did not show for her appt with Dr. Brett Fairy today.

## 2014-07-12 ENCOUNTER — Encounter: Payer: Self-pay | Admitting: Neurology

## 2014-07-15 ENCOUNTER — Ambulatory Visit
Admission: RE | Admit: 2014-07-15 | Discharge: 2014-07-15 | Disposition: A | Discharge status: Home / Self Care | Payer: 59 | Source: Ambulatory Visit | Attending: Family Medicine | Admitting: Family Medicine

## 2014-07-15 DIAGNOSIS — Z1231 Encounter for screening mammogram for malignant neoplasm of breast: Secondary | ICD-10-CM

## 2015-01-12 ENCOUNTER — Encounter (HOSPITAL_COMMUNITY): Payer: Self-pay | Admitting: Physical Medicine and Rehabilitation

## 2015-01-12 ENCOUNTER — Emergency Department (HOSPITAL_COMMUNITY)
Admission: EM | Admit: 2015-01-12 | Discharge: 2015-01-13 | Disposition: A | Discharge status: Home / Self Care | Payer: 59 | Attending: Emergency Medicine | Admitting: Emergency Medicine

## 2015-01-12 ENCOUNTER — Emergency Department (HOSPITAL_COMMUNITY): Payer: 59

## 2015-01-12 DIAGNOSIS — Z8659 Personal history of other mental and behavioral disorders: Secondary | ICD-10-CM | POA: Insufficient documentation

## 2015-01-12 DIAGNOSIS — R110 Nausea: Secondary | ICD-10-CM | POA: Diagnosis not present

## 2015-01-12 DIAGNOSIS — Z793 Long term (current) use of hormonal contraceptives: Secondary | ICD-10-CM | POA: Insufficient documentation

## 2015-01-12 DIAGNOSIS — R42 Dizziness and giddiness: Secondary | ICD-10-CM | POA: Diagnosis not present

## 2015-01-12 DIAGNOSIS — R51 Headache: Secondary | ICD-10-CM | POA: Insufficient documentation

## 2015-01-12 DIAGNOSIS — R202 Paresthesia of skin: Secondary | ICD-10-CM | POA: Diagnosis not present

## 2015-01-12 DIAGNOSIS — H53149 Visual discomfort, unspecified: Secondary | ICD-10-CM | POA: Insufficient documentation

## 2015-01-12 DIAGNOSIS — Z79899 Other long term (current) drug therapy: Secondary | ICD-10-CM | POA: Diagnosis not present

## 2015-01-12 DIAGNOSIS — H538 Other visual disturbances: Secondary | ICD-10-CM | POA: Insufficient documentation

## 2015-01-12 DIAGNOSIS — R519 Headache, unspecified: Secondary | ICD-10-CM

## 2015-01-12 DIAGNOSIS — Z9981 Dependence on supplemental oxygen: Secondary | ICD-10-CM | POA: Insufficient documentation

## 2015-01-12 DIAGNOSIS — J45909 Unspecified asthma, uncomplicated: Secondary | ICD-10-CM | POA: Diagnosis not present

## 2015-01-12 DIAGNOSIS — G4733 Obstructive sleep apnea (adult) (pediatric): Secondary | ICD-10-CM | POA: Diagnosis not present

## 2015-01-12 DIAGNOSIS — M25512 Pain in left shoulder: Secondary | ICD-10-CM | POA: Insufficient documentation

## 2015-01-12 MED ORDER — KETOROLAC TROMETHAMINE 30 MG/ML IJ SOLN
30.0000 mg | Freq: Once | INTRAMUSCULAR | Status: AC
  Administered 2015-01-12: 30 mg via INTRAVENOUS
  Filled 2015-01-12: qty 1

## 2015-01-12 MED ORDER — DEXAMETHASONE SODIUM PHOSPHATE 10 MG/ML IJ SOLN
10.0000 mg | Freq: Once | INTRAMUSCULAR | Status: AC
  Administered 2015-01-12: 10 mg via INTRAVENOUS
  Filled 2015-01-12: qty 1

## 2015-01-12 MED ORDER — DIPHENHYDRAMINE HCL 50 MG/ML IJ SOLN
25.0000 mg | Freq: Once | INTRAMUSCULAR | Status: AC
  Administered 2015-01-12: 25 mg via INTRAVENOUS
  Filled 2015-01-12: qty 1

## 2015-01-12 MED ORDER — METOCLOPRAMIDE HCL 5 MG/ML IJ SOLN
10.0000 mg | Freq: Once | INTRAMUSCULAR | Status: AC
  Administered 2015-01-12: 10 mg via INTRAVENOUS
  Filled 2015-01-12: qty 2

## 2015-01-12 NOTE — ED Provider Notes (Signed)
CSN: [id number]     Arrival date & time 01/12/15  2237 History  By signing my name below, I, Emmanuella Mensah, attest that this documentation has been prepared under the direction and in the presence of Veryl Speak, MD. Electronically Signed: Judithann Sauger, ED Scribe. 01/12/2015. 11:15 PM.    Chief Complaint  Patient presents with  . Headache   The history is provided by the patient. No language interpreter was used.   HPI Comments: Diane Patel is a 34 y.o. female with a hx of frequent HA who presents to the Emergency Department complaining of a gradually worsening constant frontal HA onset 2 days ago. She reports associated blurred vision, nausea, photophobia, dizziness, and mild tingling in her bilateral hands. She also adds that she has had intermittent left shoulder pain that radiates toward her legs and mild back pain. She denies any vomiting, light-headedness, or weakness. She denies a hx of migraines but states that she has frequent intermittent HA. No alleviating factors noted.   Past Medical History  Diagnosis Date  . Asthma   . Depression   . Headaches, cluster     frequent  . Allergy   . Hyperlipidemia   . Obesity, morbid ([place])   . Back pain   . OSA (obstructive sleep apnea)     01-2014 dignosed and CPAP titrated   Past Surgical History  Procedure Laterality Date  . Appendectomy  2004  . Cholecystectomy  2010  . Tonsillectomy and adenoidectomy  1985   Family History  Problem Relation Age of Onset  . Hypertension Mother   . Diabetes Mother   . Heart disease Mother   . Polycystic ovary syndrome Mother   . Diabetes Father   . Hypertension Father   . Diabetes Sister   . Pancreatitis Sister   . Cancer Maternal Grandmother     ? stomach cancer  . Diabetes Maternal Grandmother   . Cancer Maternal Grandfather     bone  . Glaucoma Maternal Grandfather   . Osteoporosis Maternal Grandfather   . Hypertension Paternal Grandfather   . Mental illness Paternal  Grandfather   . Emphysema Father     smoked  . Asthma Brother   . Cancer - Ovarian Mother    Social History  Substance Use Topics  . Smoking status: Never Smoker   . Smokeless tobacco: Never Used  . Alcohol Use: No   OB History    No data available     Review of Systems  Constitutional: Negative for fever and chills.  Eyes: Positive for photophobia and visual disturbance.  Gastrointestinal: Positive for nausea. Negative for vomiting and diarrhea.  Neurological: Positive for dizziness and numbness. Negative for weakness and light-headedness.  All other systems reviewed and are negative.     Allergies  Ace inhibitors; Flexeril; Naproxen; and Pineapple  Home Medications   Prior to Admission medications   Medication Sig Start Date End Date Taking? Authorizing Provider  albuterol (PROVENTIL HFA;VENTOLIN HFA) 108 (90 BASE) MCG/ACT inhaler Inhale 1 puff into the lungs every 6 (six) hours as needed for wheezing or shortness of breath.    Historical Provider, MD  beclomethasone (QVAR) 40 MCG/ACT inhaler Inhale 1 puff into the lungs as needed.    Historical Provider, MD  carbamazepine (TEGRETOL) 100 MG chewable tablet Chew 1 tablet (100 mg total) by mouth 2 (two) times daily. 04/27/14   Asencion Partridge Dohmeier, MD  cetirizine (ZYRTEC) 10 MG tablet Take 10 mg by mouth daily.    Historical Provider,  MD  cholecalciferol (VITAMIN D) 1000 UNITS tablet Take 1,000 Units by mouth daily.    Historical Provider, MD  famotidine (PEPCID) 20 MG tablet Take 20 mg by mouth at bedtime.    Historical Provider, MD  fluticasone (FLONASE) 50 MCG/ACT nasal spray Place 2 sprays into the nose daily as needed. 09/23/12   Brunetta Jeans, PA-C  furosemide (LASIX) 20 MG tablet TAKE 1 TABLET BY MOUTH EVERY DAY AS NEEDED 07/28/13   Debbrah Alar, NP  levonorgestrel-ethinyl estradiol (AVIANE,ALESSE,LESSINA) 0.1-20 MG-MCG tablet Take 1 tablet by mouth daily.    Historical Provider, MD  Multiple Vitamin (MULTIVITAMIN)  tablet Take 1 tablet by mouth daily.    Historical Provider, MD  pantoprazole (PROTONIX) 40 MG tablet Take 40 mg by mouth daily. 08/17/13   Historical Provider, MD  tiZANidine (ZANAFLEX) 4 MG tablet Take 2-4 mg by mouth every 6 (six) hours as needed (Spasms).    Historical Provider, MD  vitamin B-12 (CYANOCOBALAMIN) 1000 MCG tablet Take 1,000 mcg by mouth daily.    Historical Provider, MD   BP 135/101 mmHg  Pulse 98  Resp 18  Ht 5\' 3"  (1.6 m)  Wt 290 lb (131.543 kg)  BMI 51.38 kg/m2  SpO2 98% Physical Exam  Constitutional: She is oriented to person, place, and time. She appears well-developed and well-nourished. No distress.  HENT:  Head: Normocephalic and atraumatic.  Mouth/Throat: Oropharynx is clear and moist.  Eyes: Conjunctivae and EOM are normal. Pupils are equal, round, and reactive to light.  Neck: Normal range of motion. Neck supple. No tracheal deviation present.  Cardiovascular: Normal rate, regular rhythm and normal heart sounds.   Pulmonary/Chest: Effort normal and breath sounds normal. No respiratory distress.  Musculoskeletal: Normal range of motion.  Neurological: She is alert and oriented to person, place, and time. No cranial nerve deficit. She exhibits normal muscle tone. Coordination normal.  Skin: Skin is warm and dry.  Psychiatric: She has a normal mood and affect. Her behavior is normal.  Nursing note and vitals reviewed.   ED Course  Procedures (including critical care time) DIAGNOSTIC STUDIES: Oxygen Saturation is 98% on RA, normal by my interpretation.    COORDINATION OF CARE: 11:12 PM- Pt advised of plan for treatment and pt agrees. Will receive blood work and a CAT scan for further evaluation. Pt will also receive migraine cocktail.    Labs Review Labs Reviewed  BASIC METABOLIC PANEL  CBC WITH DIFFERENTIAL/PLATELET    Imaging Review No results found.   Veryl Speak, MD has personally reviewed and evaluated these images and lab results as part of  his medical decision-making.   EKG Interpretation None      MDM   Final diagnoses:  None    Patient presents with complaints of headache and generalized malaise for the past several days. She reports feeling nauseated. Her physical examination is unremarkable. CT scan of the head and laboratory studies are unremarkable as well. She was given IV fluids, a migraine cocktail is now feeling better. I see nothing emergent and feel as though she is appropriate for discharge. She is to follow-up with her primary Dr. if not improving.  I personally performed the services described in this documentation, which was scribed in my presence. The recorded information has been reviewed and is accurate.        Veryl Speak, MD 01/13/15 [phone]

## 2015-01-12 NOTE — ED Notes (Signed)
Pt presents to department for evaluation of headache since Tuesday. Also reports nausea and photosensitivity. Pt is alert and oriented x4. No neurological deficits noted.

## 2015-01-13 LAB — CBC WITH DIFFERENTIAL/PLATELET
BASOS ABS: 0 10*3/uL (ref 0.0–0.1)
Basophils Relative: 1 %
Eosinophils Absolute: 0.2 10*3/uL (ref 0.0–0.7)
Eosinophils Relative: 2 %
HEMATOCRIT: 42.2 % (ref 36.0–46.0)
Hemoglobin: 13.2 g/dL (ref 12.0–15.0)
LYMPHS ABS: 3 10*3/uL (ref 0.7–4.0)
LYMPHS PCT: 40 %
MCH: 25.8 pg — ABNORMAL LOW (ref 26.0–34.0)
MCHC: 31.3 g/dL (ref 30.0–36.0)
MCV: 82.6 fL (ref 78.0–100.0)
MONOS PCT: 7 %
Monocytes Absolute: 0.5 10*3/uL (ref 0.1–1.0)
NEUTROS ABS: 3.8 10*3/uL (ref 1.7–7.7)
NEUTROS PCT: 50 %
PLATELETS: 313 10*3/uL (ref 150–400)
RBC: 5.11 MIL/uL (ref 3.87–5.11)
RDW: 13.5 % (ref 11.5–15.5)
WBC: 7.6 10*3/uL (ref 4.0–10.5)

## 2015-01-13 LAB — BASIC METABOLIC PANEL
ANION GAP: 8 (ref 5–15)
BUN: <5 mg/dL — ABNORMAL LOW (ref 6–20)
CALCIUM: 9 mg/dL (ref 8.9–10.3)
CHLORIDE: 107 mmol/L (ref 101–111)
CO2: 27 mmol/L (ref 22–32)
Creatinine, Ser: 0.84 mg/dL (ref 0.44–1.00)
GFR calc Af Amer: >60 mL/min (ref >60)
GFR calc non Af Amer: >60 mL/min (ref >60)
Glucose, Bld: 94 mg/dL (ref 65–99)
POTASSIUM: 3.9 mmol/L (ref 3.5–5.1)
Sodium: 142 mmol/L (ref 135–145)

## 2015-01-13 NOTE — Discharge Instructions (Signed)
Tylenol 1000 mg rotated with Motrin 600 mg every 4 hours as needed for pain.  Follow-up with your primary Dr. if not improving in the next few days.   General Headache Without Cause A headache is pain or discomfort felt around the head or neck area. The specific cause of a headache may not be found. There are many causes and types of headaches. A few common ones are:  Tension headaches.  Migraine headaches.  Cluster headaches.  Chronic daily headaches. HOME CARE INSTRUCTIONS  Watch your condition for any changes. Take these steps to help with your condition: Managing Pain  Take over-the-counter and prescription medicines only as told by your health care provider.  Lie down in a dark, quiet room when you have a headache.  If directed, apply ice to the head and neck area:  Put ice in a plastic bag.  Place a towel between your skin and the bag.  Leave the ice on for 20 minutes, 2-3 times per day.  Use a heating pad or hot shower to apply heat to the head and neck area as told by your health care provider.  Keep lights dim if bright lights bother you or make your headaches worse. Eating and Drinking  Eat meals on a regular schedule.  Limit alcohol use.  Decrease the amount of caffeine you drink, or stop drinking caffeine. General Instructions  Keep all follow-up visits as told by your health care provider. This is important.  Keep a headache journal to help find out what may trigger your headaches. For example, write down:  What you eat and drink.  How much sleep you get.  Any change to your diet or medicines.  Try massage or other relaxation techniques.  Limit stress.  Sit up straight, and do not tense your muscles.  Do not use tobacco products, including cigarettes, chewing tobacco, or e-cigarettes. If you need help quitting, ask your health care provider.  Exercise regularly as told by your health care provider.  Sleep on a regular schedule. Get 7-9 hours  of sleep, or the amount recommended by your health care provider. SEEK MEDICAL CARE IF:   Your symptoms are not helped by medicine.  You have a headache that is different from the usual headache.  You have nausea or you vomit.  You have a fever. SEEK IMMEDIATE MEDICAL CARE IF:   Your headache becomes severe.  You have repeated vomiting.  You have a stiff neck.  You have a loss of vision.  You have problems with speech.  You have pain in the eye or ear.  You have muscular weakness or loss of muscle control.  You lose your balance or have trouble walking.  You feel faint or pass out.  You have confusion.   This information is not intended to replace advice given to you by your health care provider. Make sure you discuss any questions you have with your health care provider.   Document Released: 01/21/2005 Document Revised: 10/12/2014 Document Reviewed: 05/16/2014 Elsevier Interactive Patient Education [employer].

## 2015-05-02 ENCOUNTER — Telehealth: Payer: Self-pay | Admitting: Neurology

## 2015-05-02 NOTE — Telephone Encounter (Signed)
Martyn Malay D with [phone] called requesting dx for carbamazepine (TEGRETOL) 100 MG chewable tablet .

## 2015-05-02 NOTE — Telephone Encounter (Signed)
Pt has not been seen in over a year, nor prescribed/refills for tegretol in over one year. Also, the RX was sent to [facility], not [facility]. I returned the call at the number provided. It rang, went to VM,  I did not leave a message. I am uncomfortable providing a dx code for this pt since we have not seen her in over year.

## 2015-05-02 NOTE — Telephone Encounter (Signed)
I spoke to Legrand Como with [facility] and advised him that I cannot provide medical information regarding this pt without a signed release. He will get the me this signed medical release from the pt.

## 2015-05-07 ENCOUNTER — Emergency Department (HOSPITAL_COMMUNITY)
Admission: EM | Admit: 2015-05-07 | Discharge: 2015-05-07 | Disposition: A | Discharge status: Home / Self Care | Payer: Managed Care, Other (non HMO) | Attending: Emergency Medicine | Admitting: Emergency Medicine

## 2015-05-07 ENCOUNTER — Encounter (HOSPITAL_COMMUNITY): Payer: Self-pay | Admitting: Emergency Medicine

## 2015-05-07 DIAGNOSIS — R1084 Generalized abdominal pain: Secondary | ICD-10-CM | POA: Insufficient documentation

## 2015-05-07 DIAGNOSIS — R110 Nausea: Secondary | ICD-10-CM

## 2015-05-07 DIAGNOSIS — R1110 Vomiting, unspecified: Secondary | ICD-10-CM

## 2015-05-07 DIAGNOSIS — Z3202 Encounter for pregnancy test, result negative: Secondary | ICD-10-CM | POA: Diagnosis not present

## 2015-05-07 DIAGNOSIS — Z9049 Acquired absence of other specified parts of digestive tract: Secondary | ICD-10-CM | POA: Insufficient documentation

## 2015-05-07 DIAGNOSIS — J45909 Unspecified asthma, uncomplicated: Secondary | ICD-10-CM | POA: Diagnosis not present

## 2015-05-07 DIAGNOSIS — F329 Major depressive disorder, single episode, unspecified: Secondary | ICD-10-CM | POA: Insufficient documentation

## 2015-05-07 DIAGNOSIS — Z79899 Other long term (current) drug therapy: Secondary | ICD-10-CM | POA: Insufficient documentation

## 2015-05-07 DIAGNOSIS — R197 Diarrhea, unspecified: Secondary | ICD-10-CM

## 2015-05-07 DIAGNOSIS — Z8669 Personal history of other diseases of the nervous system and sense organs: Secondary | ICD-10-CM | POA: Insufficient documentation

## 2015-05-07 LAB — COMPREHENSIVE METABOLIC PANEL
ALT: 21 U/L (ref 14–54)
AST: 21 U/L (ref 15–41)
Albumin: 4.3 g/dL (ref 3.5–5.0)
Alkaline Phosphatase: 62 U/L (ref 38–126)
Anion gap: 8 (ref 5–15)
BILIRUBIN TOTAL: 0.9 mg/dL (ref 0.3–1.2)
BUN: 9 mg/dL (ref 6–20)
CO2: 23 mmol/L (ref 22–32)
CREATININE: 0.8 mg/dL (ref 0.44–1.00)
Calcium: 8.9 mg/dL (ref 8.9–10.3)
Chloride: 108 mmol/L (ref 101–111)
GFR calc Af Amer: >60 mL/min (ref >60)
GFR calc non Af Amer: >60 mL/min (ref >60)
GLUCOSE: 102 mg/dL — AB (ref 65–99)
POTASSIUM: 4.2 mmol/L (ref 3.5–5.1)
Sodium: 139 mmol/L (ref 135–145)
TOTAL PROTEIN: 7.7 g/dL (ref 6.5–8.1)

## 2015-05-07 LAB — CBC
HEMATOCRIT: 43.9 % (ref 36.0–46.0)
Hemoglobin: 14.3 g/dL (ref 12.0–15.0)
MCH: 26.2 pg (ref 26.0–34.0)
MCHC: 32.6 g/dL (ref 30.0–36.0)
MCV: 80.4 fL (ref 78.0–100.0)
PLATELETS: 309 10*3/uL (ref 150–400)
RBC: 5.46 MIL/uL — ABNORMAL HIGH (ref 3.87–5.11)
RDW: 13.5 % (ref 11.5–15.5)
WBC: 8.5 10*3/uL (ref 4.0–10.5)

## 2015-05-07 LAB — URINALYSIS, ROUTINE W REFLEX MICROSCOPIC
BILIRUBIN URINE: NEGATIVE
Glucose, UA: NEGATIVE mg/dL
Hgb urine dipstick: NEGATIVE
KETONES UR: NEGATIVE mg/dL
LEUKOCYTES UA: NEGATIVE
Nitrite: NEGATIVE
PH: 7 (ref 5.0–8.0)
PROTEIN: NEGATIVE mg/dL
Specific Gravity, Urine: 1.025 (ref 1.005–1.030)

## 2015-05-07 LAB — LIPASE, BLOOD: Lipase: 25 U/L (ref 11–51)

## 2015-05-07 LAB — I-STAT BETA HCG BLOOD, ED (MC, WL, AP ONLY): I-stat hCG, quantitative: <5 m[IU]/mL (ref <5)

## 2015-05-07 MED ORDER — MORPHINE SULFATE (PF) 4 MG/ML IV SOLN
4.0000 mg | Freq: Once | INTRAVENOUS | Status: AC
  Administered 2015-05-07: 4 mg via INTRAVENOUS
  Filled 2015-05-07: qty 1

## 2015-05-07 MED ORDER — ONDANSETRON 4 MG PO TBDP: 4.0000 mg | ORAL_TABLET | Freq: Three times a day (TID) | ORAL | Status: DC | PRN

## 2015-05-07 MED ORDER — SODIUM CHLORIDE 0.9 % IV BOLUS (SEPSIS)
1000.0000 mL | Freq: Once | INTRAVENOUS | Status: AC
  Administered 2015-05-07: 1000 mL via INTRAVENOUS

## 2015-05-07 MED ORDER — KETOROLAC TROMETHAMINE 30 MG/ML IJ SOLN
30.0000 mg | Freq: Once | INTRAMUSCULAR | Status: AC
  Administered 2015-05-07: 30 mg via INTRAVENOUS
  Filled 2015-05-07: qty 1

## 2015-05-07 MED ORDER — DICYCLOMINE HCL 20 MG PO TABS: 20.0000 mg | ORAL_TABLET | Freq: Two times a day (BID) | ORAL | Status: DC

## 2015-05-07 MED ORDER — ONDANSETRON HCL 4 MG/2ML IJ SOLN
4.0000 mg | Freq: Once | INTRAMUSCULAR | Status: AC
  Administered 2015-05-07: 4 mg via INTRAVENOUS
  Filled 2015-05-07: qty 2

## 2015-05-07 NOTE — ED Notes (Signed)
Diffuse abd pain, onset Thursday. C/o nausea and diarrhea, no vomiting.

## 2015-05-07 NOTE — ED Notes (Signed)
Patient tolerated PO challenge without difficulty.

## 2015-05-07 NOTE — Discharge Instructions (Signed)
Take the prescribed medication as directed.  These should help with your symptoms Recommend BRAT diet until feeling better--see below. Follow-up with your primary care physician. Return to the ED for new or worsening symptoms.  Food Choices to Help Relieve Diarrhea, Adult When you have diarrhea, the foods you eat and your eating habits are very important. Choosing the right foods and drinks can help relieve diarrhea. Also, because diarrhea can last up to 7 days, you need to replace lost fluids and electrolytes (such as sodium, potassium, and chloride) in order to help prevent dehydration.  WHAT GENERAL GUIDELINES DO I NEED TO FOLLOW?  Slowly drink 1 cup (8 oz) of fluid for each episode of diarrhea. If you are getting enough fluid, your urine will be clear or pale yellow.  Eat starchy foods. Some good choices include white rice, white toast, pasta, low-fiber cereal, baked potatoes (without the skin), saltine crackers, and bagels.  Avoid large servings of any cooked vegetables.  Limit fruit to two servings per day. A serving is  cup or 1 small piece.  Choose foods with less than 2 g of fiber per serving.  Limit fats to less than 8 tsp (38 g) per day.  Avoid fried foods.  Eat foods that have probiotics in them. Probiotics can be found in certain dairy products.  Avoid foods and beverages that may increase the speed at which food moves through the stomach and intestines (gastrointestinal tract). Things to avoid include:  High-fiber foods, such as dried fruit, raw fruits and vegetables, nuts, seeds, and whole grain foods.  Spicy foods and high-fat foods.  Foods and beverages sweetened with high-fructose corn syrup, honey, or sugar alcohols such as xylitol, sorbitol, and mannitol. WHAT FOODS ARE RECOMMENDED? Grains White rice. White, [place], or pita breads (fresh or toasted), including plain rolls, buns, or bagels. White pasta. Saltine, soda, or graham crackers. Pretzels. Low-fiber  cereal. Cooked cereals made with water (such as cornmeal, farina, or cream cereals). Plain muffins. Matzo. Melba toast. Zwieback.  Vegetables Potatoes (without the skin). Strained tomato and vegetable juices. Most well-cooked and canned vegetables without seeds. Tender lettuce. Fruits Cooked or canned applesauce, apricots, cherries, fruit cocktail, grapefruit, peaches, pears, or plums. Fresh bananas, apples without skin, cherries, grapes, cantaloupe, grapefruit, peaches, oranges, or plums.  Meat and Other Protein Products Baked or boiled chicken. Eggs. Tofu. Fish. Seafood. Smooth peanut butter. Ground or well-cooked tender beef, ham, veal, lamb, pork, or poultry.  Dairy Plain yogurt, kefir, and unsweetened liquid yogurt. Lactose-free milk, buttermilk, or soy milk. Plain hard cheese. Beverages Sport drinks. Clear broths. Diluted fruit juices (except prune). Regular, caffeine-free sodas such as ginger ale. Water. Decaffeinated teas. Oral rehydration solutions. Sugar-free beverages not sweetened with sugar alcohols. Other Bouillon, broth, or soups made from recommended foods.  The items listed above may not be a complete list of recommended foods or beverages. Contact your dietitian for more options. WHAT FOODS ARE NOT RECOMMENDED? Grains Whole grain, whole wheat, bran, or rye breads, rolls, pastas, crackers, and cereals. Wild or brown rice. Cereals that contain more than 2 g of fiber per serving. Corn tortillas or taco shells. Cooked or dry oatmeal. Granola. Popcorn. Vegetables Raw vegetables. Cabbage, broccoli, Brussels sprouts, artichokes, baked beans, beet greens, corn, kale, legumes, peas, sweet potatoes, and yams. Potato skins. Cooked spinach and cabbage. Fruits Dried fruit, including raisins and dates. Raw fruits. Stewed or dried prunes. Fresh apples with skin, apricots, mangoes, pears, raspberries, and strawberries.  Meat and Other Protein Products Chunky peanut  butter. Nuts and seeds.  Beans and lentils. Berniece Salines.  Dairy High-fat cheeses. Milk, chocolate milk, and beverages made with milk, such as milk shakes. Cream. Ice cream. Sweets and Desserts Sweet rolls, doughnuts, and sweet breads. Pancakes and waffles. Fats and Oils Butter. Cream sauces. Margarine. Salad oils. Plain salad dressings. Olives. Avocados.  Beverages Caffeinated beverages (such as coffee, tea, soda, or energy drinks). Alcoholic beverages. Fruit juices with pulp. Prune juice. Soft drinks sweetened with high-fructose corn syrup or sugar alcohols. Other Coconut. Hot sauce. Chili powder. Mayonnaise. Gravy. Cream-based or milk-based soups.  The items listed above may not be a complete list of foods and beverages to avoid. Contact your dietitian for more information. WHAT SHOULD I DO IF I BECOME DEHYDRATED? Diarrhea can sometimes lead to dehydration. Signs of dehydration include dark urine and dry mouth and skin. If you think you are dehydrated, you should rehydrate with an oral rehydration solution. These solutions can be purchased at pharmacies, retail stores, or online.  Drink -1 cup (120-240 mL) of oral rehydration solution each time you have an episode of diarrhea. If drinking this amount makes your diarrhea worse, try drinking smaller amounts more often. For example, drink 1-3 tsp (5-15 mL) every 5-10 minutes.  A general rule for staying hydrated is to drink 1-2 L of fluid per day. Talk to your health care provider about the specific amount you should be drinking each day. Drink enough fluids to keep your urine clear or pale yellow.   This information is not intended to replace advice given to you by your health care provider. Make sure you discuss any questions you have with your health care provider.   Document Released: 04/13/2003 Document Revised: 02/11/2014 Document Reviewed: 12/14/2012 Elsevier Interactive Patient Education [employer].

## 2015-05-07 NOTE — ED Provider Notes (Signed)
CSN: [id number]     Arrival date & time 05/07/15  1028 History   First MD Initiated Contact with Patient 05/07/15 1120     Chief Complaint  Patient presents with  . Abdominal Pain     (Consider location/radiation/quality/duration/timing/severity/associated sxs/prior Treatment) Patient is a 35 y.o. female presenting with abdominal pain. The history is provided by the patient and medical records.  Abdominal Pain   35 year old female with history of asthma, depression, frequent headaches, hyperlipidemia, obesity, sleep apnea, presenting to the ED for abdominal pain. Patient states last week she went out of town to visit her mother, she returned on Friday and overall felt well. She states upon waking Saturday morning she was extremely nauseated with generalized abdominal pain/cramping. She states she is continued to feel worse over the past 24 hours. She has dry heaves but no active vomiting. She has been having some watery diarrhea as well. No melena or hematochezia. Patient states she's been unable to eat or drink anything due to her nausea. Patient is status post appendectomy and cholecystectomy. She denies possibility of pregnancy at this time. Does report low-grade fever beginning this morning. No meds taken prior to arrival today.  Past Medical History  Diagnosis Date  . Asthma   . Depression   . Headaches, cluster     frequent  . Allergy   . Hyperlipidemia   . Obesity, morbid ([place])   . Back pain   . OSA (obstructive sleep apnea)     01-2014 dignosed and CPAP titrated   Past Surgical History  Procedure Laterality Date  . Appendectomy  2004  . Cholecystectomy  2010  . Tonsillectomy and adenoidectomy  1985   Family History  Problem Relation Age of Onset  . Hypertension Mother   . Diabetes Mother   . Heart disease Mother   . Polycystic ovary syndrome Mother   . Diabetes Father   . Hypertension Father   . Diabetes Sister   . Pancreatitis Sister   . Cancer Maternal Grandmother      ? stomach cancer  . Diabetes Maternal Grandmother   . Cancer Maternal Grandfather     bone  . Glaucoma Maternal Grandfather   . Osteoporosis Maternal Grandfather   . Hypertension Paternal Grandfather   . Mental illness Paternal Grandfather   . Emphysema Father     smoked  . Asthma Brother   . Cancer - Ovarian Mother    Social History  Substance Use Topics  . Smoking status: Never Smoker   . Smokeless tobacco: Never Used  . Alcohol Use: No   OB History    No data available     Review of Systems  Gastrointestinal: Positive for abdominal pain.  All other systems reviewed and are negative.     Allergies  Ace inhibitors; Flexeril; Naproxen; and Pineapple  Home Medications   Prior to Admission medications   Medication Sig Start Date End Date Taking? Authorizing Provider  acetaminophen (TYLENOL) 500 MG tablet Take 1,000 mg by mouth every 6 (six) hours as needed for headache.    Historical Provider, MD  albuterol (PROVENTIL HFA;VENTOLIN HFA) 108 (90 BASE) MCG/ACT inhaler Inhale 1 puff into the lungs every 6 (six) hours as needed for wheezing or shortness of breath.    Historical Provider, MD  beclomethasone (QVAR) 40 MCG/ACT inhaler Inhale 1 puff into the lungs as needed.    Historical Provider, MD  carbamazepine (TEGRETOL) 100 MG chewable tablet Chew 1 tablet (100 mg total) by mouth 2 (two)  times daily. Patient taking differently: Chew 100 mg by mouth 2 (two) times daily as needed (headache).  04/27/14   Asencion Partridge Dohmeier, MD  cetirizine (ZYRTEC) 10 MG tablet Take 10 mg by mouth daily as needed for allergies.     Historical Provider, MD  fluticasone (FLONASE) 50 MCG/ACT nasal spray Place 2 sprays into the nose daily as needed for allergies.  09/23/12   Brunetta Jeans, PA-C  tiZANidine (ZANAFLEX) 4 MG tablet Take 2-4 mg by mouth every 6 (six) hours as needed (Spasms).    Historical Provider, MD   BP 150/98 mmHg  Pulse 116  Temp(Src) 99.9 F (37.7 C) (Oral)  Resp 18  Ht  5\' 3"  (1.6 m)  Wt 134.265 kg  BMI 52.45 kg/m2  SpO2 97%  LMP 03/13/2015 (Approximate)   Physical Exam  Constitutional: She is oriented to person, place, and time. She appears well-developed and well-nourished. No distress.  HENT:  Head: Normocephalic and atraumatic.  Mouth/Throat: Oropharynx is clear and moist.  Eyes: Conjunctivae and EOM are normal. Pupils are equal, round, and reactive to light.  Neck: Normal range of motion. Neck supple.  Cardiovascular: Normal rate, regular rhythm and normal heart sounds.   Pulmonary/Chest: Effort normal and breath sounds normal. No respiratory distress. She has no wheezes.  Abdominal: Soft. Bowel sounds are normal. There is generalized tenderness. There is no guarding.  Obese abdomen Generalized tenderness, no rebound, no guarding, no peritonitis  Musculoskeletal: Normal range of motion. She exhibits no edema.  Neurological: She is alert and oriented to person, place, and time.  Skin: Skin is warm and dry. She is not diaphoretic.  Psychiatric: She has a normal mood and affect.  Nursing note and vitals reviewed.   ED Course  Procedures (including critical care time) Labs Review Labs Reviewed  COMPREHENSIVE METABOLIC PANEL - Abnormal; Notable for the following:    Glucose, Bld 102 (*)    All other components within normal limits  CBC - Abnormal; Notable for the following:    RBC 5.46 (*)    All other components within normal limits  LIPASE, BLOOD  URINALYSIS, ROUTINE W REFLEX MICROSCOPIC (NOT AT [facility])  I-STAT BETA HCG BLOOD, ED ([facility], [facility], [facility] ONLY)    Imaging Review No results found. I have personally reviewed and evaluated these images and lab results as part of my medical decision-making.   EKG Interpretation None      MDM   Final diagnoses:  Nausea  Diarrhea, unspecified type  Dry heaves   35 year old female here with generalized abdominal pain, nausea, dry heaves, and diarrhea since Saturday. Patient is afebrile, nontoxic.  She has generalized abdominal tenderness without rebound or guarding. She is status post appendectomy and cholecystectomy.  Vital signs are stable-- mild tachycardia noted.  Labwork is overall reassuring. After treatment with IV fluids, Toradol, and anti-emetics patient is feeling better.  She has tolerated oral fluids without difficulty.  Feel this is likely viral process.  Will d/c home with supportive care.  Rx bentyl, zofran.  FU with PCP.  Discussed plan with patient, he/she acknowledged understanding and agreed with plan of care.  Return precautions given for new or worsening symptoms.  Larene Pickett, PA-C 05/07/15 [place], MD 05/13/15 0700

## 2015-05-07 NOTE — ED Notes (Signed)
Patient given gingerale for PO challenge.

## 2015-05-10 NOTE — Telephone Encounter (Signed)
Received a medical release from [facility] for this pt. Will scan to MR. I returned Dr. Lorrin Mais call but no answer, left a message asking him to call me back.

## 2015-12-22 ENCOUNTER — Encounter (HOSPITAL_BASED_OUTPATIENT_CLINIC_OR_DEPARTMENT_OTHER): Payer: Self-pay | Admitting: *Deleted

## 2015-12-22 ENCOUNTER — Emergency Department (HOSPITAL_BASED_OUTPATIENT_CLINIC_OR_DEPARTMENT_OTHER)
Admission: EM | Admit: 2015-12-22 | Discharge: 2015-12-22 | Disposition: A | Discharge status: Home / Self Care | Payer: Managed Care, Other (non HMO) | Attending: Emergency Medicine | Admitting: Emergency Medicine

## 2015-12-22 DIAGNOSIS — R110 Nausea: Secondary | ICD-10-CM | POA: Diagnosis not present

## 2015-12-22 DIAGNOSIS — B029 Zoster without complications: Secondary | ICD-10-CM | POA: Diagnosis not present

## 2015-12-22 DIAGNOSIS — J45909 Unspecified asthma, uncomplicated: Secondary | ICD-10-CM | POA: Insufficient documentation

## 2015-12-22 DIAGNOSIS — Z79899 Other long term (current) drug therapy: Secondary | ICD-10-CM | POA: Diagnosis not present

## 2015-12-22 DIAGNOSIS — R21 Rash and other nonspecific skin eruption: Secondary | ICD-10-CM | POA: Diagnosis present

## 2015-12-22 LAB — CBC
HEMATOCRIT: 40.2 % (ref 36.0–46.0)
HEMOGLOBIN: 12.5 g/dL (ref 12.0–15.0)
MCH: 24.6 pg — ABNORMAL LOW (ref 26.0–34.0)
MCHC: 31.1 g/dL (ref 30.0–36.0)
MCV: 79.1 fL (ref 78.0–100.0)
Platelets: 322 10*3/uL (ref 150–400)
RBC: 5.08 MIL/uL (ref 3.87–5.11)
RDW: 14.8 % (ref 11.5–15.5)
WBC: 5.7 10*3/uL (ref 4.0–10.5)

## 2015-12-22 LAB — COMPREHENSIVE METABOLIC PANEL
ALBUMIN: 4.1 g/dL (ref 3.5–5.0)
ALT: 19 U/L (ref 14–54)
ANION GAP: 7 (ref 5–15)
AST: 19 U/L (ref 15–41)
Alkaline Phosphatase: 50 U/L (ref 38–126)
BUN: 13 mg/dL (ref 6–20)
CHLORIDE: 108 mmol/L (ref 101–111)
CO2: 25 mmol/L (ref 22–32)
Calcium: 9.2 mg/dL (ref 8.9–10.3)
Creatinine, Ser: 0.77 mg/dL (ref 0.44–1.00)
GFR calc Af Amer: >60 mL/min (ref >60)
GFR calc non Af Amer: >60 mL/min (ref >60)
GLUCOSE: 83 mg/dL (ref 65–99)
POTASSIUM: 4.2 mmol/L (ref 3.5–5.1)
SODIUM: 140 mmol/L (ref 135–145)
Total Bilirubin: 0.8 mg/dL (ref 0.3–1.2)
Total Protein: 7.1 g/dL (ref 6.5–8.1)

## 2015-12-22 LAB — URINALYSIS, ROUTINE W REFLEX MICROSCOPIC
Bilirubin Urine: NEGATIVE
GLUCOSE, UA: NEGATIVE mg/dL
Hgb urine dipstick: NEGATIVE
Ketones, ur: 15 mg/dL — AB
LEUKOCYTES UA: NEGATIVE
NITRITE: NEGATIVE
PROTEIN: NEGATIVE mg/dL
Specific Gravity, Urine: 1.022 (ref 1.005–1.030)
pH: 7 (ref 5.0–8.0)

## 2015-12-22 LAB — PREGNANCY, URINE: PREG TEST UR: NEGATIVE

## 2015-12-22 MED ORDER — VALACYCLOVIR HCL 1 G PO TABS: 1000.0000 mg | ORAL_TABLET | Freq: Two times a day (BID) | ORAL | 0 refills | Status: DC

## 2015-12-22 MED ORDER — PREDNISONE 20 MG PO TABS: 20.0000 mg | ORAL_TABLET | Freq: Every day | ORAL | 0 refills | Status: DC

## 2015-12-22 MED FILL — predniSONE 20 MG TABS: 20 | 5 days supply | Qty: 5 | Fill #0

## 2015-12-22 MED FILL — valACYclovir HCL 1 GM TABS: 1 | 7 days supply | Qty: 14 | Fill #0

## 2015-12-22 NOTE — ED Triage Notes (Signed)
Pain and bloating in her entire abdomen x 2 weeks but constant for a week. Rash under her left breast. Pimples on her face.

## 2015-12-22 NOTE — ED Provider Notes (Signed)
[facility] DEPT [facility] Provider Note   CSN: [id number] Arrival date & time: 12/22/15  1054  History   Chief Complaint Chief Complaint  Patient presents with  . Abdominal Pain   HPI Diane Patel is a 35 y.o. female.  HPI  Presenting today for abdominal pain and rash under left breast. Reports abdominal pain is general, but worse under rash. Notes some nausea. Pain has been present for two weeks. Rash has been present since Monday 12/18/15. Describes pain as burning. Denies fever, but does feel warm occasionally. Denis dysuria, urinary frequency, urinary urgency. LMP in March 2017, but states this is not abnormal for her given her PCOS. Reports history of appendectomy and cholecystectomy.   History of GERD and PCOS noted.  Past Medical History:  Diagnosis Date  . Allergy   . Asthma   . Back pain   . Depression   . Headaches, cluster    frequent  . Hyperlipidemia   . Obesity, morbid ([place])   . OSA (obstructive sleep apnea)    01-2014 dignosed and CPAP titrated    Patient Active Problem List   Diagnosis Date Noted  . Obesity hypoventilation syndrome ([place]) 04/27/2014  . Morbid obesity ([place]) 04/27/2014  . Nocturia more than twice per night 04/27/2014  . Sleep related headaches 04/27/2014  . 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 OSA (obstructive sleep apnea) 09/14/2013  . Upper airway cough syndrome 07/27/2013  . SOB (shortness of breath) 07/09/2013  . Family history of systemic lupus erythematosus 07/09/2013  . Left arm pain 06/21/2013  . 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 Edema 06/21/2013  . Anemia 12/22/2012  . Routine general medical examination at a health care facility 12/22/2012  . Migraine 12/21/2012  . Asthma 07/13/2010  . MENORRHAGIA 03/30/2010  . ECZEMA 03/30/2010  . DYSPEPSIA 09/05/2009  . POLYCYSTIC OVARIAN DISEASE 09/04/2009  . OBESITY 09/04/2009  . DEPRESSION 09/04/2009  . GERD 09/04/2009  . FATIGUE 09/04/2009  . CHEST PAIN, ATYPICAL 09/04/2009    Past  Surgical History:  Procedure Laterality Date  . APPENDECTOMY  2004  . CHOLECYSTECTOMY  2010  . TONSILLECTOMY AND ADENOIDECTOMY  1985    OB History    No data available     Home Medications    Prior to Admission medications   Medication Sig Start Date End Date Taking? Authorizing Provider  acetaminophen (TYLENOL) 500 MG tablet Take 1,000 mg by mouth every 6 (six) hours as needed for headache.   Yes Historical Provider, MD  albuterol (PROVENTIL HFA;VENTOLIN HFA) 108 (90 BASE) MCG/ACT inhaler Inhale 1 puff into the lungs every 6 (six) hours as needed for wheezing or shortness of breath.   Yes Historical Provider, MD  beclomethasone (QVAR) 40 MCG/ACT inhaler Inhale 2 puffs into the lungs daily.    Yes Historical Provider, MD  cetirizine (ZYRTEC) 10 MG tablet Take 10 mg by mouth daily as needed for allergies.    Yes Historical Provider, MD  fluticasone (FLONASE) 50 MCG/ACT nasal spray Place 2 sprays into the nose daily as needed for allergies.  09/23/12  Yes Brunetta Jeans, PA-C  pantoprazole (PROTONIX) 40 MG tablet Take 40 mg by mouth daily.  04/26/15  Yes Historical Provider, MD  carbamazepine (TEGRETOL) 100 MG chewable tablet Chew 1 tablet (100 mg total) by mouth 2 (two) times daily. Patient taking differently: Chew 100 mg by mouth 2 (two) times daily as needed (headache).  04/27/14   Asencion Partridge Dohmeier, MD  dicyclomine (BENTYL) 20 MG tablet Take 1 tablet (20 mg total) by  mouth 2 (two) times daily. 05/07/15   Larene Pickett, PA-C  ondansetron (ZOFRAN ODT) 4 MG disintegrating tablet Take 1 tablet (4 mg total) by mouth every 8 (eight) hours as needed for nausea. 05/07/15   Larene Pickett, PA-C  predniSONE (DELTASONE) 20 MG tablet Take 1 tablet (20 mg total) by mouth daily with breakfast. Take 60mg  x 2 days, 40mg  x 2 days, 20mg  x 3 days 12/22/15   Burna Cash Davide Risdon, DO  valACYclovir (VALTREX) 1000 MG tablet Take 1 tablet (1,000 mg total) by mouth 2 (two) times daily. 12/22/15   Lorna Few, DO    Family History Family History  Problem Relation Age of Onset  . Cancer Maternal Grandmother     ? stomach cancer  . Diabetes Maternal Grandmother   . Hypertension Mother   . Diabetes Mother   . Heart disease Mother   . Polycystic ovary syndrome Mother   . Cancer - Ovarian Mother   . Diabetes Father   . Hypertension Father   . Emphysema Father     smoked  . Diabetes Sister   . Pancreatitis Sister   . Cancer Maternal Grandfather     bone  . Glaucoma Maternal Grandfather   . Osteoporosis Maternal Grandfather   . Hypertension Paternal Grandfather   . Mental illness Paternal Grandfather   . Asthma Brother     Social History Social History  Substance Use Topics  . Smoking status: Never Smoker  . Smokeless tobacco: Never Used  . Alcohol use No    Allergies   Ace inhibitors; Pineapple; Chocolate; Flexeril [cyclobenzaprine]; Lactose intolerance (gi); and Naproxen  Review of Systems Review of Systems  Constitutional: Negative for fever.  Respiratory: Negative for cough.   Cardiovascular: Negative for chest pain and palpitations.  Gastrointestinal: Positive for abdominal pain and nausea. Negative for constipation, diarrhea and vomiting.  Genitourinary: Negative for dysuria, flank pain, frequency, hematuria, urgency and vaginal pain.  Skin: Positive for rash.  Hematological: Negative for adenopathy.     Physical Exam Updated Vital Signs BP 129/99   Pulse 88   Temp 98.1 F (36.7 C) (Oral)   Resp 18   Ht 5\' 3"  (1.6 m)   Wt 129.3 kg   SpO2 99%   BMI 50.49 kg/m   Physical Exam  Constitutional: She appears well-developed and well-nourished. No distress.  HENT:  Head: Normocephalic and atraumatic.  Cardiovascular: Normal rate and regular rhythm.   No murmur heard. Pulmonary/Chest: Effort normal. No respiratory distress. She has no wheezes.  Abdominal: Soft. Bowel sounds are normal. She exhibits no distension.  Mild diffuse tenderness, worse of LUQ. Negative  rebound. Negative Murphy's.  Skin: Skin is warm.  Vesicular rash noted under left breast. Does not cross midline.  Psychiatric: She has a normal mood and affect. Her behavior is normal.     ED Treatments / Results  Labs (all labs ordered are listed, but only abnormal results are displayed) Labs Reviewed  URINALYSIS, ROUTINE W REFLEX MICROSCOPIC (NOT AT [facility]) - Abnormal; Notable for the following:       Result Value   Ketones, ur 15 (*)    All other components within normal limits  CBC - Abnormal; Notable for the following:    MCH 24.6 (*)    All other components within normal limits  PREGNANCY, URINE  COMPREHENSIVE METABOLIC PANEL    EKG  EKG Interpretation None       Radiology No results found.  Procedures Procedures (including critical care  time)  Medications Ordered in ED Medications - No data to display   Initial Impression / Assessment and Plan / ED Course  I have reviewed the triage vital signs and the nursing notes.  Pertinent labs & imaging results that were available during my care of the patient were reviewed by me and considered in my medical decision making (see chart for details).  Clinical Course   - Pregnancy test negative - UA with 15 ketones, otherwise negative. - CBC normal - CMP normal  Final Clinical Impressions(s) / ED Diagnoses   Final diagnoses:  Herpes zoster without complication  Rash and burning pain consistent with Zoster. Prescriptions for Prednisone and Valacyclovir given. May use Benadryl for itching. Recommended Zoster vaccination after acute episode resolves. Follow up with PCP.  New Prescriptions New Prescriptions   PREDNISONE (DELTASONE) 20 MG TABLET    Take 1 tablet (20 mg total) by mouth daily with breakfast. Take 60mg  x 2 days, 40mg  x 2 days, 20mg  x 3 days   VALACYCLOVIR (VALTREX) 1000 MG TABLET    Take 1 tablet (1,000 mg total) by mouth 2 (two) times daily.     [place], [place] 12/22/15 [place],  MD 12/24/15 [phone]

## 2016-03-24 ENCOUNTER — Emergency Department (HOSPITAL_BASED_OUTPATIENT_CLINIC_OR_DEPARTMENT_OTHER): Payer: Managed Care, Other (non HMO)

## 2016-03-24 ENCOUNTER — Encounter (HOSPITAL_BASED_OUTPATIENT_CLINIC_OR_DEPARTMENT_OTHER): Payer: Self-pay | Admitting: Emergency Medicine

## 2016-03-24 ENCOUNTER — Emergency Department (HOSPITAL_BASED_OUTPATIENT_CLINIC_OR_DEPARTMENT_OTHER)
Admission: EM | Admit: 2016-03-24 | Discharge: 2016-03-24 | Disposition: A | Discharge status: Home / Self Care | Payer: Managed Care, Other (non HMO) | Attending: Emergency Medicine | Admitting: Emergency Medicine

## 2016-03-24 DIAGNOSIS — S39012A Strain of muscle, fascia and tendon of lower back, initial encounter: Secondary | ICD-10-CM

## 2016-03-24 DIAGNOSIS — Y999 Unspecified external cause status: Secondary | ICD-10-CM | POA: Insufficient documentation

## 2016-03-24 DIAGNOSIS — I880 Nonspecific mesenteric lymphadenitis: Secondary | ICD-10-CM | POA: Insufficient documentation

## 2016-03-24 DIAGNOSIS — S3992XA Unspecified injury of lower back, initial encounter: Secondary | ICD-10-CM | POA: Diagnosis present

## 2016-03-24 DIAGNOSIS — X58XXXA Exposure to other specified factors, initial encounter: Secondary | ICD-10-CM | POA: Insufficient documentation

## 2016-03-24 DIAGNOSIS — Y929 Unspecified place or not applicable: Secondary | ICD-10-CM | POA: Diagnosis not present

## 2016-03-24 DIAGNOSIS — R1084 Generalized abdominal pain: Secondary | ICD-10-CM

## 2016-03-24 DIAGNOSIS — J029 Acute pharyngitis, unspecified: Secondary | ICD-10-CM | POA: Diagnosis not present

## 2016-03-24 DIAGNOSIS — Z79899 Other long term (current) drug therapy: Secondary | ICD-10-CM | POA: Insufficient documentation

## 2016-03-24 DIAGNOSIS — Y939 Activity, unspecified: Secondary | ICD-10-CM | POA: Diagnosis not present

## 2016-03-24 DIAGNOSIS — J45909 Unspecified asthma, uncomplicated: Secondary | ICD-10-CM | POA: Insufficient documentation

## 2016-03-24 LAB — CBC WITH DIFFERENTIAL/PLATELET
Basophils Absolute: 0 10*3/uL (ref 0.0–0.1)
Basophils Relative: 1 %
EOS PCT: 2 %
Eosinophils Absolute: 0.1 10*3/uL (ref 0.0–0.7)
HEMATOCRIT: 38.1 % (ref 36.0–46.0)
Hemoglobin: 12 g/dL (ref 12.0–15.0)
LYMPHS ABS: 2.1 10*3/uL (ref 0.7–4.0)
LYMPHS PCT: 27 %
MCH: 25.7 pg — AB (ref 26.0–34.0)
MCHC: 31.5 g/dL (ref 30.0–36.0)
MCV: 81.6 fL (ref 78.0–100.0)
MONO ABS: 0.6 10*3/uL (ref 0.1–1.0)
Monocytes Relative: 7 %
Neutro Abs: 5 10*3/uL (ref 1.7–7.7)
Neutrophils Relative %: 63 %
PLATELETS: 359 10*3/uL (ref 150–400)
RBC: 4.67 MIL/uL (ref 3.87–5.11)
RDW: 14.9 % (ref 11.5–15.5)
WBC: 7.8 10*3/uL (ref 4.0–10.5)

## 2016-03-24 LAB — COMPREHENSIVE METABOLIC PANEL
ALT: 17 U/L (ref 14–54)
AST: 23 U/L (ref 15–41)
Albumin: 3.8 g/dL (ref 3.5–5.0)
Alkaline Phosphatase: 55 U/L (ref 38–126)
Anion gap: 7 (ref 5–15)
BILIRUBIN TOTAL: 0.5 mg/dL (ref 0.3–1.2)
BUN: 14 mg/dL (ref 6–20)
CALCIUM: 8.8 mg/dL — AB (ref 8.9–10.3)
CHLORIDE: 106 mmol/L (ref 101–111)
CO2: 26 mmol/L (ref 22–32)
CREATININE: 0.79 mg/dL (ref 0.44–1.00)
Glucose, Bld: 95 mg/dL (ref 65–99)
Potassium: 3.7 mmol/L (ref 3.5–5.1)
Sodium: 139 mmol/L (ref 135–145)
TOTAL PROTEIN: 6.8 g/dL (ref 6.5–8.1)

## 2016-03-24 LAB — URINALYSIS, ROUTINE W REFLEX MICROSCOPIC
BILIRUBIN URINE: NEGATIVE
Glucose, UA: NEGATIVE mg/dL
Hgb urine dipstick: NEGATIVE
KETONES UR: NEGATIVE mg/dL
NITRITE: NEGATIVE
Protein, ur: NEGATIVE mg/dL
Specific Gravity, Urine: 1.019 (ref 1.005–1.030)
pH: 7 (ref 5.0–8.0)

## 2016-03-24 LAB — URINALYSIS, MICROSCOPIC (REFLEX)

## 2016-03-24 LAB — WET PREP, GENITAL
Clue Cells Wet Prep HPF POC: NONE SEEN
Sperm: NONE SEEN
Trich, Wet Prep: NONE SEEN
YEAST WET PREP: NONE SEEN

## 2016-03-24 LAB — LIPASE, BLOOD: LIPASE: 20 U/L (ref 11–51)

## 2016-03-24 LAB — PREGNANCY, URINE: PREG TEST UR: NEGATIVE

## 2016-03-24 MED ORDER — METHOCARBAMOL 1000 MG/10ML IJ SOLN
1000.0000 mg | Freq: Once | INTRAMUSCULAR | Status: DC
  Filled 2016-03-24: qty 10

## 2016-03-24 MED ORDER — METHOCARBAMOL 500 MG PO TABS
500.0000 mg | ORAL_TABLET | Freq: Once | ORAL | Status: AC
  Administered 2016-03-24: 500 mg via ORAL
  Filled 2016-03-24: qty 1

## 2016-03-24 MED ORDER — IOPAMIDOL (ISOVUE-300) INJECTION 61%
100.0000 mL | Freq: Once | INTRAVENOUS | Status: AC | PRN
  Administered 2016-03-24: 100 mL via INTRAVENOUS

## 2016-03-24 MED ORDER — METHOCARBAMOL 500 MG PO TABS: 500.0000 mg | ORAL_TABLET | Freq: Three times a day (TID) | ORAL | 0 refills | Status: DC | PRN

## 2016-03-24 MED ORDER — KETOROLAC TROMETHAMINE 30 MG/ML IJ SOLN
30.0000 mg | Freq: Once | INTRAMUSCULAR | Status: AC
  Administered 2016-03-24: 30 mg via INTRAVENOUS
  Filled 2016-03-24: qty 1

## 2016-03-24 MED ORDER — IBUPROFEN 800 MG PO TABS: 800.0000 mg | ORAL_TABLET | Freq: Three times a day (TID) | ORAL | 0 refills | Status: DC | PRN

## 2016-03-24 MED ORDER — ONDANSETRON HCL 4 MG/2ML IJ SOLN
4.0000 mg | Freq: Once | INTRAMUSCULAR | Status: AC
  Administered 2016-03-24: 4 mg via INTRAVENOUS
  Filled 2016-03-24: qty 2

## 2016-03-24 NOTE — Discharge Instructions (Addendum)
To find a primary care or specialty doctor please call [phone] or [phone] to access "[facility]."  You may also go on the [facility] website at [email or web address]  There are also multiple [facility], [facility], [facility] and [facility] practices throughout the [place] that are frequently accepting new patients. You may find a clinic that is close to your home and contact them.  [place] [phone] [place]  [place] [address] [phone]   [email or web address] [place] # [place], [place] [phone]    [email or web address] [address] [place], [place] ([place]) [place] # [place], [place] [phone]   [facility] [email or web address] [address] [place], [place] [phone]   [facility] [email or web address] [address] [place], [place] [phone]   [facility] [email or web address], [place] [phone]

## 2016-03-24 NOTE — ED Provider Notes (Signed)
TIME SEEN: 5:45 AM  CHIEF COMPLAINT: Multiple complaints  HPI: Patient is a 36 year old female with history of morbid obesity, hyperlipidemia, asthma who presents emergency department with multiple different complaints. Patient reports for the past 3-4 weeks she has had lower back pain that is worse with movement and palpation. She does not remember any injury to her back. She denies any numbness, tingling or focal weakness. Is able to ambulate. No bowel or bladder incontinence or urinary retention. No history of back surgeries or epidural injections. Has been taking Tylenol at home with some relief of pain.   Patient also reports that for the past week she has had diffuse abdominal pain. States it starts in the mid abdomen and radiates down into her pelvic region and back up. She has had no vomiting, diarrhea. No dysuria, hematuria, urinary frequency or urgency, vaginal bleeding or discharge. Is sexually active with one female partner who is at the bedside. No history of STDs. She is status post cholecystectomy and appendectomy. Has never had similar abdominal pain before. States pain got worse several days ago after eating a chicken wrap and she felt like her stomach was "roaring". She has had some nausea.  LMP 2 weeks ago.   Patient also complains that yesterday she began feeling like she had chills and a mild sore throat with "raspy voice". No cough. No documented fever. No known sick contacts. Has had previous tonsillectomy.  ROS: See HPI Constitutional: no fever  Eyes: no drainage  ENT: no runny nose   Cardiovascular:  no chest pain  Resp: no SOB  GI: no vomiting or diarrhea GU: no dysuria Integumentary: no rash  Allergy: no hives  Musculoskeletal: no leg swelling  Neurological: no slurred speech ROS otherwise negative  PAST MEDICAL HISTORY/PAST SURGICAL HISTORY:  Past Medical History:  Diagnosis Date  . Allergy   . Asthma   . Back pain   . Depression   . Headaches, cluster     frequent  . Hyperlipidemia   . Obesity, morbid ([place])   . OSA (obstructive sleep apnea)    01-2014 dignosed and CPAP titrated    MEDICATIONS:  Prior to Admission medications   Medication Sig Start Date End Date Taking? Authorizing Provider  acetaminophen (TYLENOL) 500 MG tablet Take 1,000 mg by mouth every 6 (six) hours as needed for headache.    Historical Provider, MD  albuterol (PROVENTIL HFA;VENTOLIN HFA) 108 (90 BASE) MCG/ACT inhaler Inhale 1 puff into the lungs every 6 (six) hours as needed for wheezing or shortness of breath.    Historical Provider, MD  beclomethasone (QVAR) 40 MCG/ACT inhaler Inhale 2 puffs into the lungs daily.     Historical Provider, MD  carbamazepine (TEGRETOL) 100 MG chewable tablet Chew 1 tablet (100 mg total) by mouth 2 (two) times daily. Patient taking differently: Chew 100 mg by mouth 2 (two) times daily as needed (headache).  04/27/14   Asencion Partridge Dohmeier, MD  cetirizine (ZYRTEC) 10 MG tablet Take 10 mg by mouth daily as needed for allergies.     Historical Provider, MD  dicyclomine (BENTYL) 20 MG tablet Take 1 tablet (20 mg total) by mouth 2 (two) times daily. 05/07/15   Larene Pickett, PA-C  fluticasone (FLONASE) 50 MCG/ACT nasal spray Place 2 sprays into the nose daily as needed for allergies.  09/23/12   Brunetta Jeans, PA-C  ondansetron (ZOFRAN ODT) 4 MG disintegrating tablet Take 1 tablet (4 mg total) by mouth every 8 (eight) hours as needed for  nausea. 05/07/15   Larene Pickett, PA-C  pantoprazole (PROTONIX) 40 MG tablet Take 40 mg by mouth daily.  04/26/15   Historical Provider, MD  predniSONE (DELTASONE) 20 MG tablet Take 1 tablet (20 mg total) by mouth daily with breakfast. Take 60mg  x 2 days, 40mg  x 2 days, 20mg  x 3 days 12/22/15   Burna Cash Rumley, DO  valACYclovir (VALTREX) 1000 MG tablet Take 1 tablet (1,000 mg total) by mouth 2 (two) times daily. 12/22/15   [place] Zipporah Plants, DO    ALLERGIES:  Allergies  Allergen Reactions  . Ace Inhibitors  Anaphylaxis  . Pineapple Anaphylaxis  . Chocolate Itching    Has been diagnosed by allergist   . Flexeril [Cyclobenzaprine] Hives  . Lactose Intolerance (Gi)   . Naproxen Itching    SOCIAL HISTORY:  Social History  Substance Use Topics  . Smoking status: Never Smoker  . Smokeless tobacco: Never Used  . Alcohol use No    FAMILY HISTORY: Family History  Problem Relation Age of Onset  . Cancer Maternal Grandmother     ? stomach cancer  . Diabetes Maternal Grandmother   . Hypertension Mother   . Diabetes Mother   . Heart disease Mother   . Polycystic ovary syndrome Mother   . Cancer - Ovarian Mother   . Diabetes Father   . Hypertension Father   . Emphysema Father     smoked  . Diabetes Sister   . Pancreatitis Sister   . Cancer Maternal Grandfather     bone  . Glaucoma Maternal Grandfather   . Osteoporosis Maternal Grandfather   . Hypertension Paternal Grandfather   . Mental illness Paternal Grandfather   . Asthma Brother     EXAM: BP 114/69 (BP Location: Right Arm)   Pulse 81   Temp 98.4 F (36.9 C) (Oral)   Resp 16   Ht 5' 2.5" (1.588 m)   Wt 280 lb (127 kg)   SpO2 100%   BMI 50.40 kg/m  CONSTITUTIONAL: Alert and oriented and responds appropriately to questions. Well-appearing; well-nourished, Morbidly obese, afebrile, in no significant distress HEAD: Normocephalic EYES: Conjunctivae clear, PERRL, EOMI ENT: normal nose; no rhinorrhea; moist mucous membranes; No pharyngeal erythema or petechiae, status post tonsillectomy, no uvular deviation, no unilateral swelling, no trismus or drooling, no muffled voice, normal phonation, no stridor, no dental caries present, no drainable dental abscess noted, no Ludwig's angina, tongue sits flat in the bottom of the mouth, no angioedema, no facial erythema or warmth, no facial swelling; no pain with movement of the neck NECK: Supple, no meningismus, no nuchal rigidity, no LAD  CARD: RRR; S1 and S2 appreciated; no murmurs, no  clicks, no rubs, no gallops RESP: Normal chest excursion without splinting or tachypnea; breath sounds clear and equal bilaterally; no wheezes, no rhonchi, no rales, no hypoxia or respiratory distress, speaking full sentences ABD/GI: Normal bowel sounds; non-distended; soft, tender throughout the mid and lower abdomen but exam is somewhat limited due to morbid obesity, no rebound, no guarding, no peritoneal signs, no hepatosplenomegaly GU:  Normal external genitalia. No lesions, rashes noted. Patient has no vaginal bleeding on exam. No vaginal discharge.  No adnexal tenderness, mass or fullness, no cervical motion tenderness.  Exam is somewhat limited by her morbid obesity. Cervix is not appear friable.  Cervix is closed.  Chaperone present for exam. BACK:  The back appears normal and is tender over the bilateral lumbar paraspinal musculature without any associated lesions, erythema or warmth.  There is no midline spinal tenderness or step-off or deformity.  There is no CVA tenderness EXT: Normal ROM in all joints; non-tender to palpation; no edema; normal capillary refill; no cyanosis, no calf tenderness or swelling    SKIN: Normal color for age and race; warm; no rash NEURO: Moves all extremities equally, sensation to light touch intact diffusely, cranial nerves II through XII intact, normal speech, no saddle anesthesia, normal gait PSYCH: The patient's mood and manner are appropriate. Grooming and personal hygiene are appropriate.  MEDICAL DECISION MAKING: Patient here with multiple complaints. I suspect that her back pain is from lumbar strain. She has no neurologic deficits or red flag symptoms to suggest cauda equina, spinal stenosis, epidural abscess or hematoma, discitis, transverse myelitis, fracture. I do not feel she needs acute imaging of her back. Have recommended anti-inflammatories and muscle relaxers. Will give dose of Toradol here and reassess.   As for patient's abdominal pain,  differential is large including colitis, diverticulitis, bowel obstruction.  I feel it is unlikely that it is PID, TOA given her pelvic exam seems to be unremarkable. Doubt torsion as well given no significant adnexal tenderness on exam. Pelvic cultures are pending. Urine pregnancy test is negative. Urine shows small leukocytes and few bacteria but doubt urinary tract infection. She is not having urinary symptoms. Exam is limited because of her obesity. Will obtain CT scan and abdominal labs for further evaluation. Will give Zofran for nausea.    Patient also complaining of chills, sore throat, "raspy voice".  Suspect viral illness. Nothing on exam to suggest strep pharyngitis and she is status post tonsillectomy. No sign of deep space neck infection, peritonsillar abscess. She has normal phonation here. No cough, chest pain or shortness of breath. Doubt influenza given afebrile. Recommended alternating Tylenol, Motrin at home. I do not feel she is antibiotics for this at this time.  ED PROGRESS: Patient's wet prep shows moderate white blood cells but otherwise unremarkable.  Patient's labs are unremarkable. No leukocytosis. Normal creatinine, LFTs and lipase. Have discussed plan of care with patient including CT scan. If CT scan is unremarkable, anticipate discharge home with outpatient follow-up with PCP and OB/GYN. We'll discharge with prescriptions for ibuprofen and Robaxin for her back pain. I do not feel she needs antibiotics for her pharyngitis.  Signed out to oncoming provider to follow-up on CT scan.   I reviewed all nursing notes, vitals, pertinent old records, EKGs, labs, imaging (as available).      [place], DO 03/24/16 [phone]

## 2016-03-24 NOTE — ED Triage Notes (Signed)
Pt reports back pain x 1 month, pelvic pain x 10 days, and abd pain x 3-4 days. Also reports chills, sore throat, and nausea without vomiting. Denies fever, HA, or diarrhea.

## 2016-03-24 NOTE — ED Provider Notes (Signed)
CT negative for acute findings other than some small lymph nodes that may be suggestive of mesenteric adenitis. Discussed all this with the patient and her husband and they're comfortable with following up as an outpatient if it does not getting better.  Patient also had recently started a new exercise regime itch may have made her back pain worse. Discussed with her low impact exercises and if what she is doing is making it hurt worse to avoid those particular activities.   Blanchie Dessert, MD 03/24/16 [phone]

## 2016-03-25 LAB — GC/CHLAMYDIA PROBE AMP (~~LOC~~) NOT AT ARMC
Chlamydia: NEGATIVE
Neisseria Gonorrhea: NEGATIVE

## 2016-03-28 ENCOUNTER — Emergency Department (HOSPITAL_COMMUNITY)
Admission: EM | Admit: 2016-03-28 | Discharge: 2016-03-28 | Disposition: A | Discharge status: Home / Self Care | Payer: Managed Care, Other (non HMO) | Source: Home / Self Care

## 2016-03-28 ENCOUNTER — Emergency Department (HOSPITAL_COMMUNITY): Payer: Managed Care, Other (non HMO)

## 2016-03-28 ENCOUNTER — Emergency Department (HOSPITAL_COMMUNITY)
Admission: EM | Admit: 2016-03-28 | Discharge: 2016-03-28 | Disposition: A | Discharge status: Home / Self Care | Payer: Managed Care, Other (non HMO) | Attending: Emergency Medicine | Admitting: Emergency Medicine

## 2016-03-28 ENCOUNTER — Encounter (HOSPITAL_COMMUNITY): Payer: Self-pay | Admitting: Emergency Medicine

## 2016-03-28 ENCOUNTER — Encounter (HOSPITAL_COMMUNITY): Payer: Self-pay

## 2016-03-28 DIAGNOSIS — J45909 Unspecified asthma, uncomplicated: Secondary | ICD-10-CM | POA: Insufficient documentation

## 2016-03-28 DIAGNOSIS — R197 Diarrhea, unspecified: Secondary | ICD-10-CM | POA: Insufficient documentation

## 2016-03-28 DIAGNOSIS — R112 Nausea with vomiting, unspecified: Secondary | ICD-10-CM

## 2016-03-28 DIAGNOSIS — B9789 Other viral agents as the cause of diseases classified elsewhere: Secondary | ICD-10-CM

## 2016-03-28 DIAGNOSIS — R109 Unspecified abdominal pain: Secondary | ICD-10-CM

## 2016-03-28 DIAGNOSIS — M549 Dorsalgia, unspecified: Secondary | ICD-10-CM | POA: Insufficient documentation

## 2016-03-28 DIAGNOSIS — R05 Cough: Secondary | ICD-10-CM | POA: Diagnosis present

## 2016-03-28 DIAGNOSIS — Z79899 Other long term (current) drug therapy: Secondary | ICD-10-CM

## 2016-03-28 DIAGNOSIS — Z5321 Procedure and treatment not carried out due to patient leaving prior to being seen by health care provider: Secondary | ICD-10-CM

## 2016-03-28 DIAGNOSIS — J069 Acute upper respiratory infection, unspecified: Secondary | ICD-10-CM | POA: Diagnosis not present

## 2016-03-28 LAB — BASIC METABOLIC PANEL
Anion gap: 5 (ref 5–15)
BUN: 9 mg/dL (ref 6–20)
CHLORIDE: 109 mmol/L (ref 101–111)
CO2: 28 mmol/L (ref 22–32)
Calcium: 9.2 mg/dL (ref 8.9–10.3)
Creatinine, Ser: 1.1 mg/dL — ABNORMAL HIGH (ref 0.44–1.00)
GFR calc Af Amer: >60 mL/min (ref >60)
GFR calc non Af Amer: >60 mL/min (ref >60)
Glucose, Bld: 82 mg/dL (ref 65–99)
POTASSIUM: 4 mmol/L (ref 3.5–5.1)
SODIUM: 142 mmol/L (ref 135–145)

## 2016-03-28 LAB — CBC
HEMATOCRIT: 40 % (ref 36.0–46.0)
HEMOGLOBIN: 12.8 g/dL (ref 12.0–15.0)
MCH: 26 pg (ref 26.0–34.0)
MCHC: 32 g/dL (ref 30.0–36.0)
MCV: 81.1 fL (ref 78.0–100.0)
Platelets: 335 10*3/uL (ref 150–400)
RBC: 4.93 MIL/uL (ref 3.87–5.11)
RDW: 14.9 % (ref 11.5–15.5)
WBC: 6.7 10*3/uL (ref 4.0–10.5)

## 2016-03-28 LAB — CBC WITH DIFFERENTIAL/PLATELET
BASOS ABS: 0 10*3/uL (ref 0.0–0.1)
BASOS PCT: 0 %
Eosinophils Absolute: 0.2 10*3/uL (ref 0.0–0.7)
Eosinophils Relative: 3 %
HEMATOCRIT: 39.5 % (ref 36.0–46.0)
HEMOGLOBIN: 12.6 g/dL (ref 12.0–15.0)
Lymphocytes Relative: 28 %
Lymphs Abs: 2 10*3/uL (ref 0.7–4.0)
MCH: 25.6 pg — ABNORMAL LOW (ref 26.0–34.0)
MCHC: 31.9 g/dL (ref 30.0–36.0)
MCV: 80.1 fL (ref 78.0–100.0)
Monocytes Absolute: 0.5 10*3/uL (ref 0.1–1.0)
Monocytes Relative: 8 %
NEUTROS ABS: 4.4 10*3/uL (ref 1.7–7.7)
NEUTROS PCT: 61 %
Platelets: 359 10*3/uL (ref 150–400)
RBC: 4.93 MIL/uL (ref 3.87–5.11)
RDW: 14.6 % (ref 11.5–15.5)
WBC: 7.1 10*3/uL (ref 4.0–10.5)

## 2016-03-28 LAB — COMPREHENSIVE METABOLIC PANEL
ALBUMIN: 3.7 g/dL (ref 3.5–5.0)
ALT: 16 U/L (ref 14–54)
ANION GAP: 7 (ref 5–15)
AST: 18 U/L (ref 15–41)
Alkaline Phosphatase: 55 U/L (ref 38–126)
BILIRUBIN TOTAL: 0.6 mg/dL (ref 0.3–1.2)
BUN: 7 mg/dL (ref 6–20)
CHLORIDE: 108 mmol/L (ref 101–111)
CO2: 25 mmol/L (ref 22–32)
Calcium: 9.3 mg/dL (ref 8.9–10.3)
Creatinine, Ser: 0.85 mg/dL (ref 0.44–1.00)
GFR calc Af Amer: >60 mL/min (ref >60)
GFR calc non Af Amer: >60 mL/min (ref >60)
GLUCOSE: 81 mg/dL (ref 65–99)
POTASSIUM: 4 mmol/L (ref 3.5–5.1)
Sodium: 140 mmol/L (ref 135–145)
TOTAL PROTEIN: 6.8 g/dL (ref 6.5–8.1)

## 2016-03-28 LAB — I-STAT TROPONIN, ED: TROPONIN I, POC: 0 ng/mL (ref 0.00–0.08)

## 2016-03-28 LAB — LIPASE, BLOOD: LIPASE: 22 U/L (ref 11–51)

## 2016-03-28 MED ORDER — ONDANSETRON 4 MG PO TBDP: 4.0000 mg | ORAL_TABLET | Freq: Three times a day (TID) | ORAL | 0 refills | Status: DC | PRN

## 2016-03-28 MED ORDER — IPRATROPIUM-ALBUTEROL 0.5-2.5 (3) MG/3ML IN SOLN
3.0000 mL | Freq: Once | RESPIRATORY_TRACT | Status: AC
  Administered 2016-03-28: 3 mL via RESPIRATORY_TRACT
  Filled 2016-03-28: qty 3

## 2016-03-28 MED ORDER — ONDANSETRON 8 MG PO TBDP
8.0000 mg | ORAL_TABLET | Freq: Once | ORAL | Status: AC
  Administered 2016-03-28: 8 mg via ORAL
  Filled 2016-03-28: qty 1

## 2016-03-28 MED ORDER — DICYCLOMINE HCL 10 MG PO CAPS
10.0000 mg | ORAL_CAPSULE | Freq: Once | ORAL | Status: AC
  Administered 2016-03-28: 10 mg via ORAL
  Filled 2016-03-28: qty 1

## 2016-03-28 MED ORDER — ALBUTEROL SULFATE HFA 108 (90 BASE) MCG/ACT IN AERS: 1.0000 | INHALATION_SPRAY | Freq: Four times a day (QID) | RESPIRATORY_TRACT | 0 refills | Status: DC | PRN

## 2016-03-28 MED ORDER — BENZONATATE 100 MG PO CAPS: 100.0000 mg | ORAL_CAPSULE | Freq: Three times a day (TID) | ORAL | 0 refills | Status: DC

## 2016-03-28 MED ORDER — AZITHROMYCIN 250 MG PO TABS: 250.0000 mg | ORAL_TABLET | Freq: Every day | ORAL | 0 refills | Status: DC

## 2016-03-28 NOTE — Discharge Instructions (Signed)
Please rest and drink plenty of fluids Take antibiotic for 5 days Take Tessalon for cough as needed Use inhaler as needed for SOB Take Zofran as needed for nausea/vomiting

## 2016-03-28 NOTE — ED Triage Notes (Signed)
Patient c/o SOB with productive cough and chest "burning" since Sunday. Patient also reports sore throat and body aches. Reports nausea but denies V/D.

## 2016-03-28 NOTE — ED Provider Notes (Signed)
[facility] DEPT Provider Note   CSN: [id number] Arrival date & time: 03/28/16  1453     History   Chief Complaint Chief Complaint  Patient presents with  . Shortness of Breath  . Cough    HPI Diane Patel is a 36 y.o. female who presents with multiple symptoms. PMH significant for morbid obesity and asthma. She states her symptoms started about 5 days ago and have gradually worsened. She was seen at [facility] for what sounds like viral gastroenteritis on Sunday. She had CT of A&P which was remarkable for mesenteric adenitis. Otherwise work up was unremarkable. She was discharged with Robaxin and Ibuprofen. She was complaining of cough and sore throat at that time as well although since then these symptoms have worsened. Today she woke up with a burning chest pain when she coughs and SOB therefore she came to the ED again. She reports generalized malaise and weakness, decreased PO intake, chills, sore throat, chest pain with coughing, productive cough, SOB, wheezing which is worse at night, ongoing abdominal pain, N/V/D. She denies fever, ear pain, headache, dysuria. She does not have a PCP and has been out of her inhaler.   HPI  Past Medical History:  Diagnosis Date  . Allergy   . Asthma   . Back pain   . Depression   . Headaches, cluster    frequent  . Hyperlipidemia   . Obesity, morbid ([place])   . OSA (obstructive sleep apnea)    01-2014 dignosed and CPAP titrated    Patient Active Problem List   Diagnosis Date Noted  . Obesity hypoventilation syndrome ([place]) 04/27/2014  . Morbid obesity ([place]) 04/27/2014  . Nocturia more than twice per night 04/27/2014  . Sleep related headaches 04/27/2014  . 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 OSA (obstructive sleep apnea) 09/14/2013  . Upper airway cough syndrome 07/27/2013  . SOB (shortness of breath) 07/09/2013  . Family history of systemic lupus erythematosus 07/09/2013  . Left arm pain 06/21/2013  . 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  Edema 06/21/2013  . Anemia 12/22/2012  . Routine general medical examination at a health care facility 12/22/2012  . Migraine 12/21/2012  . Asthma 07/13/2010  . MENORRHAGIA 03/30/2010  . ECZEMA 03/30/2010  . DYSPEPSIA 09/05/2009  . POLYCYSTIC OVARIAN DISEASE 09/04/2009  . OBESITY 09/04/2009  . DEPRESSION 09/04/2009  . GERD 09/04/2009  . FATIGUE 09/04/2009  . CHEST PAIN, ATYPICAL 09/04/2009    Past Surgical History:  Procedure Laterality Date  . APPENDECTOMY  2004  . CHOLECYSTECTOMY  2010  . TONSILLECTOMY AND ADENOIDECTOMY  1985    OB History    No data available       Home Medications    Prior to Admission medications   Medication Sig Start Date End Date Taking? Authorizing Provider  acetaminophen (TYLENOL) 500 MG tablet Take 1,000 mg by mouth every 6 (six) hours as needed for headache.    Historical Provider, MD  albuterol (PROVENTIL HFA;VENTOLIN HFA) 108 (90 BASE) MCG/ACT inhaler Inhale 1 puff into the lungs every 6 (six) hours as needed for wheezing or shortness of breath.    Historical Provider, MD  beclomethasone (QVAR) 40 MCG/ACT inhaler Inhale 2 puffs into the lungs daily.     Historical Provider, MD  carbamazepine (TEGRETOL) 100 MG chewable tablet Chew 1 tablet (100 mg total) by mouth 2 (two) times daily. Patient taking differently: Chew 100 mg by mouth 2 (two) times daily as needed (headache).  04/27/14   Larey Seat, MD  cetirizine Alethia Berthold)  10 MG tablet Take 10 mg by mouth daily as needed for allergies.     Historical Provider, MD  dicyclomine (BENTYL) 20 MG tablet Take 1 tablet (20 mg total) by mouth 2 (two) times daily. 05/07/15   Larene Pickett, PA-C  fluticasone (FLONASE) 50 MCG/ACT nasal spray Place 2 sprays into the nose daily as needed for allergies.  09/23/12   Brunetta Jeans, PA-C  ibuprofen (ADVIL,MOTRIN) 800 MG tablet Take 1 tablet (800 mg total) by mouth every 8 (eight) hours as needed for mild pain. 03/24/16   Kristen N Ward, DO  methocarbamol  (ROBAXIN) 500 MG tablet Take 1 tablet (500 mg total) by mouth every 8 (eight) hours as needed for muscle spasms. 03/24/16   Kristen N Ward, DO  ondansetron (ZOFRAN ODT) 4 MG disintegrating tablet Take 1 tablet (4 mg total) by mouth every 8 (eight) hours as needed for nausea. 05/07/15   Larene Pickett, PA-C  pantoprazole (PROTONIX) 40 MG tablet Take 40 mg by mouth daily.  04/26/15   Historical Provider, MD  predniSONE (DELTASONE) 20 MG tablet Take 1 tablet (20 mg total) by mouth daily with breakfast. Take 60mg  x 2 days, 40mg  x 2 days, 20mg  x 3 days 12/22/15   Burna Cash Rumley, DO  valACYclovir (VALTREX) 1000 MG tablet Take 1 tablet (1,000 mg total) by mouth 2 (two) times daily. 12/22/15   Lorna Few, DO    Family History Family History  Problem Relation Age of Onset  . Cancer Maternal Grandmother     ? stomach cancer  . Diabetes Maternal Grandmother   . Hypertension Mother   . Diabetes Mother   . Heart disease Mother   . Polycystic ovary syndrome Mother   . Cancer - Ovarian Mother   . Diabetes Father   . Hypertension Father   . Emphysema Father     smoked  . Diabetes Sister   . Pancreatitis Sister   . Cancer Maternal Grandfather     bone  . Glaucoma Maternal Grandfather   . Osteoporosis Maternal Grandfather   . Hypertension Paternal Grandfather   . Mental illness Paternal Grandfather   . Asthma Brother     Social History Social History  Substance Use Topics  . Smoking status: Never Smoker  . Smokeless tobacco: Never Used  . Alcohol use No     Allergies   Ace inhibitors; Pineapple; Chocolate; Flexeril [cyclobenzaprine]; Lactose intolerance (gi); and Naproxen  Review of Systems Review of Systems  Constitutional: Positive for appetite change, chills and fatigue. Negative for fever.  HENT: Positive for rhinorrhea and sore throat. Negative for ear pain.   Respiratory: Positive for cough, chest tightness, shortness of breath and wheezing.   Cardiovascular: Positive for  chest pain (with coughing). Negative for palpitations and leg swelling.  Gastrointestinal: Positive for abdominal pain, diarrhea, nausea and vomiting.  Genitourinary: Negative for dysuria.  All other systems reviewed and are negative.   Physical Exam Updated Vital Signs BP 143/98 (BP Location: Right Wrist)   Pulse 100   Temp 98.4 F (36.9 C) (Oral)   Resp 20   Ht 5\' 3"  (1.6 m)   Wt 129.3 kg   SpO2 100%   BMI 50.49 kg/m   Physical Exam  Constitutional: She is oriented to person, place, and time. She appears well-developed and well-nourished. No distress.  Morbid obesity. Pleasant, calm, cooperative.  HENT:  Head: Normocephalic and atraumatic.  Right Ear: Hearing, tympanic membrane, external ear and ear canal normal.  Left Ear: Hearing, tympanic membrane, external ear and ear canal normal.  Nose: Mucosal edema present. No rhinorrhea.  Mouth/Throat: Uvula is midline, oropharynx is clear and moist and mucous membranes are normal.  Eyes: Conjunctivae are normal. Pupils are equal, round, and reactive to light. Right eye exhibits no discharge. Left eye exhibits no discharge. No scleral icterus.  Neck: Normal range of motion.  Cardiovascular: Tachycardia present.  Exam reveals no gallop and no friction rub.   No murmur heard. Pulmonary/Chest: Effort normal and breath sounds normal. No respiratory distress. She has no wheezes. She has no rales. She exhibits no tenderness.  Abdominal: Soft. Bowel sounds are normal. She exhibits no distension and no mass. There is tenderness. There is no rebound and no guarding. No hernia.  Mild periumbilical and left side tenderness  Neurological: She is alert and oriented to person, place, and time.  Skin: Skin is warm and dry.  Psychiatric: She has a normal mood and affect. Her behavior is normal.  Nursing note and vitals reviewed.   ED Treatments / Results  Labs (all labs ordered are listed, but only abnormal results are displayed) Labs Reviewed    BASIC METABOLIC PANEL - Abnormal; Notable for the following:       Result Value   Creatinine, Ser 1.10 (*)    All other components within normal limits  CBC WITH DIFFERENTIAL/PLATELET - Abnormal; Notable for the following:    MCH 25.6 (*)    All other components within normal limits  I-STAT TROPOININ, ED    EKG  EKG Interpretation None       Radiology Dg Chest 2 View  Result Date: 03/28/2016 CLINICAL DATA:  Cough and congestion starting Sunday EXAM: CHEST  2 VIEW COMPARISON:  07/09/2013 FINDINGS: Cardiomediastinal silhouette is stable. No pulmonary edema. There is streaky right base medially atelectasis or early infiltrate. Mild degenerative changes mid and lower thoracic spine. IMPRESSION: No pulmonary edema. Streaky right base medially atelectasis or early infiltrate. Electronically Signed   By: Lahoma Crocker M.D.   On: 03/28/2016 15:44    Procedures Procedures (including critical care time)  Medications Ordered in ED Medications  ipratropium-albuterol (DUONEB) 0.5-2.5 (3) MG/3ML nebulizer solution 3 mL (not administered)  dicyclomine (BENTYL) capsule 10 mg (not administered)  ondansetron (ZOFRAN-ODT) disintegrating tablet 8 mg (not administered)    Initial Impression / Assessment and Plan / ED Course  I have reviewed the triage vital signs and the nursing notes.  Pertinent labs & imaging results that were available during my care of the patient were reviewed by me and considered in my medical decision making (see chart for details).  36 year old female with ongoing symptoms from viral illness. She is afebrile, mildly hypertensive and tachycardic. No hypoxia or respiratory distress. CBC unremarkable. BMP remarkable for mild bump in SCr to 1.1. Troponin is 0. EKG is SR. CXR remarkable for possible early right sided PNA in base. Chest pain likely due to cough. Will treat with Azithromycin, Tessalon, Zofran, and refill given for albuterol. Encouraged establishing care with PCP.  Work note given. Patient is NAD, non-toxic, with stable VS. Patient is informed of clinical course, understands medical decision making process, and agrees with plan. Opportunity for questions provided and all questions answered. Return precautions given.   Final Clinical Impressions(s) / ED Diagnoses   Final diagnoses:  Viral URI with cough  Nausea vomiting and diarrhea    New Prescriptions Discharge Medication List as of 03/28/2016  6:03 PM    START taking  these medications   Details  !! albuterol (PROVENTIL HFA;VENTOLIN HFA) 108 (90 Base) MCG/ACT inhaler Inhale 1-2 puffs into the lungs every 6 (six) hours as needed for wheezing or shortness of breath., Starting Thu 03/28/2016, Print    azithromycin (ZITHROMAX) 250 MG tablet Take 1 tablet (250 mg total) by mouth daily. Take first 2 tablets together, then 1 every day until finished., Starting Thu 03/28/2016, Print    benzonatate (TESSALON) 100 MG capsule Take 1 capsule (100 mg total) by mouth every 8 (eight) hours., Starting Thu 03/28/2016, Print     !! - Potential duplicate medications found. Please discuss with provider.       Recardo Evangelist, PA-C 03/28/16 [place], MD 03/29/16 [phone]

## 2016-03-28 NOTE — ED Notes (Signed)
Pt decided she would seek treatment elsewhere due to wait time. This tech apologized for the wait and encouraged Pt to stay. Pt was not upset when leaving.

## 2016-03-28 NOTE — ED Triage Notes (Signed)
Patient complains of ongoing abdominal and back pain, seen at [facility] on Sunday and had negative CT abdomen. States ongoing abdominal pain now has cough with thick sputum and congestion, NAD. Alert and oriented. Nausea ongoing, no emesis

## 2017-03-31 ENCOUNTER — Encounter (HOSPITAL_BASED_OUTPATIENT_CLINIC_OR_DEPARTMENT_OTHER): Payer: Self-pay | Admitting: *Deleted

## 2017-03-31 ENCOUNTER — Emergency Department (HOSPITAL_BASED_OUTPATIENT_CLINIC_OR_DEPARTMENT_OTHER)
Admission: EM | Admit: 2017-03-31 | Discharge: 2017-03-31 | Disposition: A | Discharge status: Home / Self Care | Payer: 59 | Attending: Physician Assistant | Admitting: Physician Assistant

## 2017-03-31 ENCOUNTER — Other Ambulatory Visit: Payer: Self-pay

## 2017-03-31 DIAGNOSIS — Y939 Activity, unspecified: Secondary | ICD-10-CM | POA: Insufficient documentation

## 2017-03-31 DIAGNOSIS — T161XXA Foreign body in right ear, initial encounter: Secondary | ICD-10-CM | POA: Insufficient documentation

## 2017-03-31 DIAGNOSIS — Z79899 Other long term (current) drug therapy: Secondary | ICD-10-CM | POA: Diagnosis not present

## 2017-03-31 DIAGNOSIS — Y999 Unspecified external cause status: Secondary | ICD-10-CM | POA: Insufficient documentation

## 2017-03-31 DIAGNOSIS — Y929 Unspecified place or not applicable: Secondary | ICD-10-CM | POA: Diagnosis not present

## 2017-03-31 DIAGNOSIS — H6691 Otitis media, unspecified, right ear: Secondary | ICD-10-CM | POA: Diagnosis not present

## 2017-03-31 DIAGNOSIS — X58XXXA Exposure to other specified factors, initial encounter: Secondary | ICD-10-CM | POA: Diagnosis not present

## 2017-03-31 DIAGNOSIS — J45909 Unspecified asthma, uncomplicated: Secondary | ICD-10-CM | POA: Insufficient documentation

## 2017-03-31 DIAGNOSIS — H6690 Otitis media, unspecified, unspecified ear: Secondary | ICD-10-CM

## 2017-03-31 DIAGNOSIS — H9201 Otalgia, right ear: Secondary | ICD-10-CM | POA: Diagnosis present

## 2017-03-31 MED ORDER — AZITHROMYCIN 250 MG PO TABS: 250.0000 mg | ORAL_TABLET | Freq: Every day | ORAL | 0 refills | Status: DC

## 2017-03-31 MED ORDER — DOCUSATE SODIUM 50 MG/5ML PO LIQD
50.0000 mg | Freq: Once | ORAL | Status: AC
  Administered 2017-03-31: 50 mg via OTIC
  Filled 2017-03-31: qty 10

## 2017-03-31 MED FILL — AZITHROMYCIN 250 MG TABLET: 250 | 5 days supply | Qty: 6 | Fill #0

## 2017-03-31 NOTE — ED Triage Notes (Signed)
Pt reports cleaning her right ear with cotton swab over a month ago and some cotton got left behind, she has had intermittent ear pain since then. Also low back pain off and on for several months.

## 2017-03-31 NOTE — ED Notes (Signed)
[place]ton swab removed from right ear after ear irrigation.

## 2017-03-31 NOTE — ED Provider Notes (Signed)
[facility] EMERGENCY DEPARTMENT Provider Note   CSN: [id number] Arrival date & time: 03/31/17  0940     History   Chief Complaint Chief Complaint  Patient presents with  . Foreign Body in [place]    HPI Diane Patel is a 37 y.o. female.  HPI   37 year old female presenting with ear discomfort and sinus pressure.  Patient reports going on for several weeks.  She reports she used a Q-tip to clean out her right ear and she feels like since then something just not been normal.  Been worse in the last several days.  Because she is also had nasal congestion.  No fevers.  Eating drinking normally.  Patient has chronic lower back pain.  Past Medical History:  Diagnosis Date  . Allergy   . Asthma   . Back pain   . Depression   . Headaches, cluster    frequent  . Hyperlipidemia   . Obesity, morbid ([place])   . OSA (obstructive sleep apnea)    01-2014 dignosed and CPAP titrated    Patient Active Problem List   Diagnosis Date Noted  . Obesity hypoventilation syndrome ([place]) 04/27/2014  . Morbid obesity ([place]) 04/27/2014  . Nocturia more than twice per night 04/27/2014  . Sleep related headaches 04/27/2014  . 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 OSA (obstructive sleep apnea) 09/14/2013  . Upper airway cough syndrome 07/27/2013  . SOB (shortness of breath) 07/09/2013  . Family history of systemic lupus erythematosus 07/09/2013  . Left arm pain 06/21/2013  . 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 Edema 06/21/2013  . Anemia 12/22/2012  . Routine general medical examination at a health care facility 12/22/2012  . Migraine 12/21/2012  . Asthma 07/13/2010  . MENORRHAGIA 03/30/2010  . ECZEMA 03/30/2010  . DYSPEPSIA 09/05/2009  . POLYCYSTIC OVARIAN DISEASE 09/04/2009  . OBESITY 09/04/2009  . DEPRESSION 09/04/2009  . GERD 09/04/2009  . FATIGUE 09/04/2009  . CHEST PAIN, ATYPICAL 09/04/2009    Past Surgical History:  Procedure Laterality Date  . APPENDECTOMY  2004  .  CHOLECYSTECTOMY  2010  . TONSILLECTOMY AND ADENOIDECTOMY  1985    OB History    No data available       Home Medications    Prior to Admission medications   Medication Sig Start Date End Date Taking? Authorizing Provider  acetaminophen (TYLENOL) 500 MG tablet Take 1,000 mg by mouth every 6 (six) hours as needed for headache.    [provider]  albuterol (PROVENTIL HFA;VENTOLIN HFA) 108 (90 BASE) MCG/ACT inhaler Inhale 1 puff into the lungs every 6 (six) hours as needed for wheezing or shortness of breath.    [provider]  albuterol (PROVENTIL HFA;VENTOLIN HFA) 108 (90 Base) MCG/ACT inhaler Inhale 1-2 puffs into the lungs every 6 (six) hours as needed for wheezing or shortness of breath. 03/28/16   Recardo Evangelist, PA-C  azithromycin (ZITHROMAX) 250 MG tablet Take 1 tablet (250 mg total) by mouth daily. Take first 2 tablets together, then 1 every day until finished. 03/28/16   Recardo Evangelist, PA-C  beclomethasone (QVAR) 40 MCG/ACT inhaler Inhale 2 puffs into the lungs daily.     [provider]  benzonatate (TESSALON) 100 MG capsule Take 1 capsule (100 mg total) by mouth every 8 (eight) hours. 03/28/16   Recardo Evangelist, PA-C  carbamazepine (TEGRETOL) 100 MG chewable tablet Chew 1 tablet (100 mg total) by mouth 2 (two) times daily. Patient taking differently: Chew 100 mg by mouth 2 (two)  times daily as needed (headache).  04/27/14   Dohmeier, Asencion Partridge, MD  cetirizine (ZYRTEC) 10 MG tablet Take 10 mg by mouth daily as needed for allergies.     [provider]  fluticasone (FLONASE) 50 MCG/ACT nasal spray Place 2 sprays into the nose daily as needed for allergies.  09/23/12   Brunetta Jeans, PA-C  ibuprofen (ADVIL,MOTRIN) 800 MG tablet Take 1 tablet (800 mg total) by mouth every 8 (eight) hours as needed for mild pain. 03/24/16   Ward, Delice Bison, DO  methocarbamol (ROBAXIN) 500 MG tablet Take 1 tablet (500 mg total) by mouth every 8 (eight) hours as  needed for muscle spasms. 03/24/16   Ward, Delice Bison, DO  ondansetron (ZOFRAN ODT) 4 MG disintegrating tablet Take 1 tablet (4 mg total) by mouth every 8 (eight) hours as needed for nausea or vomiting. 03/28/16   Recardo Evangelist, PA-C  pantoprazole (PROTONIX) 40 MG tablet Take 40 mg by mouth daily.  04/26/15   [provider]    Family History Family History  Problem Relation Age of Onset  . Cancer Maternal Grandmother        ? stomach cancer  . Diabetes Maternal Grandmother   . Hypertension Mother   . Diabetes Mother   . Heart disease Mother   . Polycystic ovary syndrome Mother   . Cancer - Ovarian Mother   . Diabetes Father   . Hypertension Father   . Emphysema Father        smoked  . Diabetes Sister   . Pancreatitis Sister   . Cancer Maternal Grandfather        bone  . Glaucoma Maternal Grandfather   . Osteoporosis Maternal Grandfather   . Hypertension Paternal Grandfather   . Mental illness Paternal Grandfather   . Asthma Brother     Social History Social History   Tobacco Use  . Smoking status: Never Smoker  . Smokeless tobacco: Never Used  Substance Use Topics  . Alcohol use: No  . Drug use: No     Allergies   Ace inhibitors; Pineapple; Chocolate; Flexeril [cyclobenzaprine]; Lactose intolerance (gi); and Naproxen   Review of Systems Review of Systems  Constitutional: Negative for activity change.  HENT: Positive for congestion, ear discharge, ear pain and sinus pressure.   Respiratory: Negative for shortness of breath.   Cardiovascular: Negative for chest pain.  Gastrointestinal: Negative for abdominal pain.     Physical Exam Updated Vital Signs BP (!) 143/103 (BP Location: Right Arm)   Pulse 89   Temp 98 F (36.7 C) (Oral)   Resp 18   Ht 5\' 2"  (1.575 m)   Wt 129.3 kg (285 lb)   SpO2 100%   BMI 52.13 kg/m   Physical Exam  Constitutional: She is oriented to person, place, and time. She appears well-developed and well-nourished.    HENT:  Head: Normocephalic and atraumatic.  Right Ear: External ear normal.  Left Ear: External ear normal.  Mouth/Throat: Oropharynx is clear and moist. No oropharyngeal exudate.  Eyes: Right eye exhibits no discharge. Left eye exhibits no discharge.  R ear with small canal, cerumen blocking TM.  Cardiovascular: Regular rhythm.  No murmur heard. Pulmonary/Chest: Effort normal and breath sounds normal. No respiratory distress.  Neurological: She is oriented to person, place, and time.  Skin: Skin is warm and dry. She is not diaphoretic.  Psychiatric: She has a normal mood and affect.  Nursing note and vitals reviewed.    ED Treatments /  Results  Labs (all labs ordered are listed, but only abnormal results are displayed) Labs Reviewed - No data to display  EKG  EKG Interpretation None       Radiology No results found.  Procedures .Ear Cerumen Removal Date/Time: 03/31/2017 11:04 AM Performed by: Macarthur Critchley, MD Authorized by: Macarthur Critchley, MD   Procedure details:    Location:  R ear   Procedure type: irrigation   Post-procedure details:    Inspection:  TM intact   Hearing quality:  Improved   Patient tolerance of procedure:  Tolerated well, no immediate complications   (including critical care time)  Medications Ordered in ED Medications - No data to display   Initial Impression / Assessment and Plan / ED Course  I have reviewed the triage vital signs and the nursing notes.  Pertinent labs & imaging results that were available during my care of the patient were reviewed by me and considered in my medical decision making (see chart for details).     37 year old female presenting with ear discomfort and sinus pressure.  Patient reports going on for several weeks.  She reports she used a Q-tip to clean out her right ear and she feels like since then something just not been normal.  Been worse in the last several days.  Because she is also  had nasal congestion.  No fevers.  Eating drinking normally.  Patient has chronic lower back pain.   11:04 AM Erythema to the TM once cerumen and cotton tip was removed.  Will treat with short course abx.    Final Clinical Impressions(s) / ED Diagnoses   Final diagnoses:  None    ED Discharge Orders    None       Alvilda Mckenna, Fredia Sorrow, MD 03/31/17 1104

## 2017-04-18 ENCOUNTER — Other Ambulatory Visit: Payer: Self-pay

## 2017-04-18 ENCOUNTER — Encounter (HOSPITAL_BASED_OUTPATIENT_CLINIC_OR_DEPARTMENT_OTHER): Payer: Self-pay | Admitting: Emergency Medicine

## 2017-04-18 ENCOUNTER — Emergency Department (HOSPITAL_BASED_OUTPATIENT_CLINIC_OR_DEPARTMENT_OTHER)
Admission: EM | Admit: 2017-04-18 | Discharge: 2017-04-18 | Disposition: A | Discharge status: Home / Self Care | Payer: 59 | Attending: Emergency Medicine | Admitting: Emergency Medicine

## 2017-04-18 DIAGNOSIS — J111 Influenza due to unidentified influenza virus with other respiratory manifestations: Secondary | ICD-10-CM | POA: Diagnosis not present

## 2017-04-18 DIAGNOSIS — Z79899 Other long term (current) drug therapy: Secondary | ICD-10-CM | POA: Diagnosis not present

## 2017-04-18 DIAGNOSIS — R69 Illness, unspecified: Secondary | ICD-10-CM

## 2017-04-18 DIAGNOSIS — J45909 Unspecified asthma, uncomplicated: Secondary | ICD-10-CM | POA: Diagnosis not present

## 2017-04-18 DIAGNOSIS — R05 Cough: Secondary | ICD-10-CM | POA: Diagnosis present

## 2017-04-18 LAB — RAPID STREP SCREEN (MED CTR MEBANE ONLY): STREPTOCOCCUS, GROUP A SCREEN (DIRECT): NEGATIVE

## 2017-04-18 MED ORDER — ALBUTEROL SULFATE HFA 108 (90 BASE) MCG/ACT IN AERS: 1.0000 | INHALATION_SPRAY | Freq: Four times a day (QID) | RESPIRATORY_TRACT | 0 refills | Status: AC | PRN

## 2017-04-18 MED ORDER — OSELTAMIVIR PHOSPHATE 75 MG PO CAPS: 75.0000 mg | ORAL_CAPSULE | Freq: Two times a day (BID) | ORAL | 0 refills | Status: DC

## 2017-04-18 MED ORDER — FLUTICASONE PROPIONATE 50 MCG/ACT NA SUSP: 1.0000 | Freq: Every day | NASAL | 2 refills | Status: DC

## 2017-04-18 MED ORDER — BENZONATATE 100 MG PO CAPS: 200.0000 mg | ORAL_CAPSULE | Freq: Three times a day (TID) | ORAL | 0 refills | Status: DC

## 2017-04-18 MED FILL — ALBUTEROL SULFATE HFA 108 (: 108 (90 BAS | 25 days supply | Qty: 18 | Fill #0

## 2017-04-18 MED FILL — BENZONATATE 100 MG CAPSULE: 100 | 3 days supply | Qty: 21 | Fill #0

## 2017-04-18 NOTE — Discharge Instructions (Signed)
Your strep test was negative. Please read attached information regarding your condition. Begin taking Tamiflu today. Take Tessalon Perles as needed for cough.  Use Flonase as needed for nasal congestion. Use your albuterol inhaler as needed for shortness of breath or wheezing. Return to ED for worsening symptoms, trouble breathing, trouble swallowing, lightheadedness, coughing up blood.

## 2017-04-18 NOTE — ED Notes (Signed)
NAD at this time. Pt is stable and going home.  

## 2017-04-18 NOTE — ED Triage Notes (Signed)
Patient states that she is here because she has had a cough and throat raspy  - patient states that her ears are also itching. The patient states that her husband has the flu

## 2017-04-18 NOTE — ED Provider Notes (Signed)
[facility] EMERGENCY DEPARTMENT Provider Note   CSN: [id number] Arrival date & time: 04/18/17  1401     History   Chief Complaint Chief Complaint  Patient presents with  . Cough    HPI Diane Patel is a 37 y.o. female with a past medical history of asthma, seasonal allergies, obesity, who presents to ED for evaluation of cough, "scratchy" throat, bilateral ear pain since last night.  She has been taking care of her husband who has the flu.  She has tried Mucinex and Elderberry pills with mild improvement in her symptoms.  She also reports subjective fever last night.  She did not receive her influenza vaccine this year.  She denies any trouble breathing, trouble swallowing, vomiting, abdominal pain, chest pain, hemoptysis or shortness of breath.  HPI  Past Medical History:  Diagnosis Date  . Allergy   . Asthma   . Back pain   . Depression   . Headaches, cluster    frequent  . Hyperlipidemia   . Obesity, morbid ([place])   . OSA (obstructive sleep apnea)    01-2014 dignosed and CPAP titrated    Patient Active Problem List   Diagnosis Date Noted  . Obesity hypoventilation syndrome ([place]) 04/27/2014  . Morbid obesity ([place]) 04/27/2014  . Nocturia more than twice per night 04/27/2014  . Sleep related headaches 04/27/2014  . 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 OSA (obstructive sleep apnea) 09/14/2013  . Upper airway cough syndrome 07/27/2013  . SOB (shortness of breath) 07/09/2013  . Family history of systemic lupus erythematosus 07/09/2013  . Left arm pain 06/21/2013  . 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 Edema 06/21/2013  . Anemia 12/22/2012  . Routine general medical examination at a health care facility 12/22/2012  . Migraine 12/21/2012  . Asthma 07/13/2010  . MENORRHAGIA 03/30/2010  . ECZEMA 03/30/2010  . DYSPEPSIA 09/05/2009  . POLYCYSTIC OVARIAN DISEASE 09/04/2009  . OBESITY 09/04/2009  . DEPRESSION 09/04/2009  . GERD 09/04/2009  . FATIGUE 09/04/2009  .  CHEST PAIN, ATYPICAL 09/04/2009    Past Surgical History:  Procedure Laterality Date  . APPENDECTOMY  2004  . CHOLECYSTECTOMY  2010  . TONSILLECTOMY AND ADENOIDECTOMY  1985    OB History    No data available       Home Medications    Prior to Admission medications   Medication Sig Start Date End Date Taking? Authorizing Provider  acetaminophen (TYLENOL) 500 MG tablet Take 1,000 mg by mouth every 6 (six) hours as needed for headache.    [provider]  albuterol (PROVENTIL HFA;VENTOLIN HFA) 108 (90 Base) MCG/ACT inhaler Inhale 1-2 puffs into the lungs every 6 (six) hours as needed for wheezing or shortness of breath. 04/18/17   Iviona Hole, PA-C  azithromycin (ZITHROMAX) 250 MG tablet Take 1 tablet (250 mg total) by mouth daily. Take first 2 tablets together, then 1 every day until finished. 03/28/16   Recardo Evangelist, PA-C  azithromycin (ZITHROMAX) 250 MG tablet Take 1 tablet (250 mg total) by mouth daily. Take first 2 tablets together, then 1 every day until finished. 03/31/17   M[place], Courteney Lyn, MD  beclomethasone (QVAR) 40 MCG/ACT inhaler Inhale 2 puffs into the lungs daily.     [provider]  benzonatate (TESSALON) 100 MG capsule Take 2 capsules (200 mg total) by mouth every 8 (eight) hours. 04/18/17   Trinitey Roache, PA-C  carbamazepine (TEGRETOL) 100 MG chewable tablet Chew 1 tablet (100 mg total) by mouth 2 (two) times daily.  Patient taking differently: Chew 100 mg by mouth 2 (two) times daily as needed (headache).  04/27/14   Dohmeier, Asencion Partridge, MD  cetirizine (ZYRTEC) 10 MG tablet Take 10 mg by mouth daily as needed for allergies.     [provider]  fluticasone (FLONASE) 50 MCG/ACT nasal spray Place 1 spray into both nostrils daily. 04/18/17   Lakely Elmendorf, PA-C  ibuprofen (ADVIL,MOTRIN) 800 MG tablet Take 1 tablet (800 mg total) by mouth every 8 (eight) hours as needed for mild pain. 03/24/16   Ward, Delice Bison, DO  methocarbamol (ROBAXIN) 500 MG  tablet Take 1 tablet (500 mg total) by mouth every 8 (eight) hours as needed for muscle spasms. 03/24/16   Ward, Delice Bison, DO  ondansetron (ZOFRAN ODT) 4 MG disintegrating tablet Take 1 tablet (4 mg total) by mouth every 8 (eight) hours as needed for nausea or vomiting. 03/28/16   Recardo Evangelist, PA-C  oseltamivir (TAMIFLU) 75 MG capsule Take 1 capsule (75 mg total) by mouth every 12 (twelve) hours. 04/18/17   Leeanna Slaby, PA-C  pantoprazole (PROTONIX) 40 MG tablet Take 40 mg by mouth daily.  04/26/15   [provider]    Family History Family History  Problem Relation Age of Onset  . Cancer Maternal Grandmother        ? stomach cancer  . Diabetes Maternal Grandmother   . Hypertension Mother   . Diabetes Mother   . Heart disease Mother   . Polycystic ovary syndrome Mother   . Cancer - Ovarian Mother   . Diabetes Father   . Hypertension Father   . Emphysema Father        smoked  . Diabetes Sister   . Pancreatitis Sister   . Cancer Maternal Grandfather        bone  . Glaucoma Maternal Grandfather   . Osteoporosis Maternal Grandfather   . Hypertension Paternal Grandfather   . Mental illness Paternal Grandfather   . Asthma Brother     Social History Social History   Tobacco Use  . Smoking status: Never Smoker  . Smokeless tobacco: Never Used  Substance Use Topics  . Alcohol use: No  . Drug use: No     Allergies   Ace inhibitors; Pineapple; Chocolate; Flexeril [cyclobenzaprine]; Lactose intolerance (gi); and Naproxen   Review of Systems Review of Systems  Constitutional: Positive for chills. Negative for fever.  HENT: Positive for congestion, ear pain and sore throat. Negative for ear discharge, hearing loss, nosebleeds, sinus pressure, sinus pain and sneezing.   Respiratory: Positive for cough.   Gastrointestinal: Positive for nausea. Negative for vomiting.  Skin: Negative for wound.     Physical Exam Updated Vital Signs BP (!) 142/100 (BP Location:  Left Arm)   Pulse (!) 101   Temp 98.9 F (37.2 C) (Oral)   Resp 18   Ht 5\' 3"  (1.6 m)   Wt 129.3 kg (285 lb)   LMP 04/18/2017   SpO2 99%   BMI 50.49 kg/m   Physical Exam  Constitutional: She appears well-developed and well-nourished. No distress.  Nontoxic appearing and in no acute distress.  HENT:  Head: Normocephalic and atraumatic.  Nose: Rhinorrhea present.  Mouth/Throat: Uvula is midline. No trismus in the jaw. No uvula swelling. No posterior oropharyngeal edema or posterior oropharyngeal erythema. Tonsils are 0 on the right. Tonsils are 0 on the left. No tonsillar exudate.  Cerumen impaction bilaterally. Postnasal drainage seen. Patient does not appear to be in acute distress.  No trismus or drooling present. No pooling of secretions. Patient is tolerating secretions and is not in respiratory distress. No neck pain or tenderness to palpation of the neck. Full active and passive range of motion of the neck. No evidence of RPA or PTA.  Eyes: Conjunctivae and EOM are normal. No scleral icterus.  Neck: Normal range of motion.  Cardiovascular: Normal rate, regular rhythm and normal heart sounds.  Pulmonary/Chest: Effort normal and breath sounds normal. No respiratory distress.  Neurological: She is alert.  Skin: No rash noted. She is not diaphoretic.  Psychiatric: She has a normal mood and affect.  Nursing note and vitals reviewed.    ED Treatments / Results  Labs (all labs ordered are listed, but only abnormal results are displayed) Labs Reviewed  RAPID STREP SCREEN (NOT AT [facility])  CULTURE, GROUP A STREP [facility])    EKG  EKG Interpretation None       Radiology No results found.  Procedures Procedures (including critical care time)  Medications Ordered in ED Medications - No data to display   Initial Impression / Assessment and Plan / ED Course  I have reviewed the triage vital signs and the nursing notes.  Pertinent labs & imaging results that were available  during my care of the patient were reviewed by me and considered in my medical decision making (see chart for details).     Patient presents to ED for evaluation of cough, "scratchy" throat, bilateral ear pain since last night.  Sick contacts including her husband with similar symptoms he was diagnosed with the flu.  Physical exam she is overall well-appearing.  There is a cough noted on my examination as well as rhinorrhea.  She has no signs of RPA or PTA on examination of posterior oropharynx and is tolerating secretions without difficulty.  Her lungs are clear to auscultation bilaterally.  Strep test returned as negative.  Due to patient's comorbidities, exposure to flu, she elects to continue with Tamiflu administration for her flulike symptoms.  Will also include antitussives, Flonase as well as a refill for her inhaler as requested.  Patient appears stable for discharge at this time.  Strict return precautions given.  Portions of this note were generated with [employer]. Dictation errors may occur despite best attempts at proofreading.   Final Clinical Impressions(s) / ED Diagnoses   Final diagnoses:  Influenza-like illness    ED Discharge Orders        Ordered    oseltamivir (TAMIFLU) 75 MG capsule  Every 12 hours     04/18/17 1443    albuterol (PROVENTIL HFA;VENTOLIN HFA) 108 (90 Base) MCG/ACT inhaler  Every 6 hours PRN     04/18/17 1443    benzonatate (TESSALON) 100 MG capsule  Every 8 hours     04/18/17 1443    fluticasone (FLONASE) 50 MCG/ACT nasal spray  Daily     04/18/17 [place], Janin Kozlowski, PA-C 04/18/17 1506    Hayden Rasmussen, MD 04/19/17 1942

## 2017-04-21 LAB — CULTURE, GROUP A STREP (THRC)

## 2017-05-07 ENCOUNTER — Emergency Department (HOSPITAL_COMMUNITY): Payer: 59

## 2017-05-07 ENCOUNTER — Encounter (HOSPITAL_COMMUNITY): Payer: Self-pay

## 2017-05-07 ENCOUNTER — Emergency Department (HOSPITAL_COMMUNITY)
Admission: EM | Admit: 2017-05-07 | Discharge: 2017-05-07 | Disposition: A | Discharge status: Home / Self Care | Payer: 59 | Attending: Emergency Medicine | Admitting: Emergency Medicine

## 2017-05-07 ENCOUNTER — Other Ambulatory Visit: Payer: Self-pay

## 2017-05-07 DIAGNOSIS — E860 Dehydration: Secondary | ICD-10-CM | POA: Insufficient documentation

## 2017-05-07 DIAGNOSIS — R05 Cough: Secondary | ICD-10-CM | POA: Diagnosis present

## 2017-05-07 DIAGNOSIS — Z79899 Other long term (current) drug therapy: Secondary | ICD-10-CM | POA: Insufficient documentation

## 2017-05-07 DIAGNOSIS — J40 Bronchitis, not specified as acute or chronic: Secondary | ICD-10-CM | POA: Diagnosis not present

## 2017-05-07 LAB — CBC WITH DIFFERENTIAL/PLATELET
BASOS ABS: 0.1 10*3/uL (ref 0.0–0.1)
BASOS PCT: 1 %
EOS ABS: 0.1 10*3/uL (ref 0.0–0.7)
Eosinophils Relative: 2 %
HCT: 37.7 % (ref 36.0–46.0)
Hemoglobin: 12.3 g/dL (ref 12.0–15.0)
Lymphocytes Relative: 28 %
Lymphs Abs: 2.2 10*3/uL (ref 0.7–4.0)
MCH: 26.4 pg (ref 26.0–34.0)
MCHC: 32.6 g/dL (ref 30.0–36.0)
MCV: 80.9 fL (ref 78.0–100.0)
MONOS PCT: 5 %
Monocytes Absolute: 0.4 10*3/uL (ref 0.1–1.0)
Neutro Abs: 5 10*3/uL (ref 1.7–7.7)
Neutrophils Relative %: 64 %
Platelets: 386 10*3/uL (ref 150–400)
RBC: 4.66 MIL/uL (ref 3.87–5.11)
RDW: 14.5 % (ref 11.5–15.5)
WBC: 7.7 10*3/uL (ref 4.0–10.5)

## 2017-05-07 LAB — URINALYSIS, ROUTINE W REFLEX MICROSCOPIC
BACTERIA UA: NONE SEEN
BILIRUBIN URINE: NEGATIVE
Glucose, UA: NEGATIVE mg/dL
KETONES UR: 5 mg/dL — AB
LEUKOCYTES UA: NEGATIVE
NITRITE: NEGATIVE
PROTEIN: NEGATIVE mg/dL
Specific Gravity, Urine: 1.017 (ref 1.005–1.030)
pH: 5 (ref 5.0–8.0)

## 2017-05-07 LAB — COMPREHENSIVE METABOLIC PANEL
ALBUMIN: 3.8 g/dL (ref 3.5–5.0)
ALT: 19 U/L (ref 14–54)
AST: 19 U/L (ref 15–41)
Alkaline Phosphatase: 54 U/L (ref 38–126)
Anion gap: 9 (ref 5–15)
BUN: 12 mg/dL (ref 6–20)
CO2: 24 mmol/L (ref 22–32)
Calcium: 8.9 mg/dL (ref 8.9–10.3)
Chloride: 104 mmol/L (ref 101–111)
Creatinine, Ser: 0.88 mg/dL (ref 0.44–1.00)
GFR calc Af Amer: >60 mL/min (ref >60)
GFR calc non Af Amer: >60 mL/min (ref >60)
GLUCOSE: 79 mg/dL (ref 65–99)
POTASSIUM: 4 mmol/L (ref 3.5–5.1)
SODIUM: 137 mmol/L (ref 135–145)
Total Bilirubin: 0.9 mg/dL (ref 0.3–1.2)
Total Protein: 7.6 g/dL (ref 6.5–8.1)

## 2017-05-07 LAB — LIPASE, BLOOD: Lipase: 28 U/L (ref 11–51)

## 2017-05-07 LAB — I-STAT BETA HCG BLOOD, ED (MC, WL, AP ONLY)

## 2017-05-07 MED ORDER — ALBUTEROL SULFATE HFA 108 (90 BASE) MCG/ACT IN AERS
2.0000 | INHALATION_SPRAY | Freq: Once | RESPIRATORY_TRACT | Status: AC
  Administered 2017-05-07: 2 via RESPIRATORY_TRACT
  Filled 2017-05-07: qty 6.7

## 2017-05-07 MED ORDER — BENZONATATE 200 MG PO CAPS: 200.0000 mg | ORAL_CAPSULE | Freq: Three times a day (TID) | ORAL | 0 refills | Status: AC | PRN

## 2017-05-07 MED ORDER — ONDANSETRON 4 MG PO TBDP: 4.0000 mg | ORAL_TABLET | Freq: Three times a day (TID) | ORAL | 0 refills | Status: DC | PRN

## 2017-05-07 MED ORDER — SODIUM CHLORIDE 0.9 % IV BOLUS
1000.0000 mL | Freq: Once | INTRAVENOUS | Status: AC
  Administered 2017-05-07: 1000 mL via INTRAVENOUS

## 2017-05-07 MED ORDER — DOXYCYCLINE HYCLATE 100 MG PO CAPS: 100.0000 mg | ORAL_CAPSULE | Freq: Two times a day (BID) | ORAL | 0 refills | Status: AC

## 2017-05-07 MED ORDER — AEROCHAMBER PLUS FLO-VU MEDIUM MISC
1.0000 | Freq: Once | Status: AC
  Administered 2017-05-07: 1
  Filled 2017-05-07: qty 1

## 2017-05-07 NOTE — ED Triage Notes (Signed)
Patient c/o a productive cough with dream colored sputum x 2 weeks and gradually getting worse. Patient states she has coughed so much that she is having pain in her abdomen and low back.

## 2017-05-07 NOTE — ED Notes (Signed)
Pt's urine has been sent to lab at this time with a culture of urine as well in case of a future order for a culture.

## 2017-05-07 NOTE — ED Notes (Signed)
Pt has been asked about a urine sample, pt states can't obtain one at this time.

## 2017-05-07 NOTE — ED Provider Notes (Signed)
[facility] DEPT Provider Note   CSN: [id number] Arrival date & time: 05/07/17  1037     History   Chief Complaint Chief Complaint  Patient presents with  . Cough  . Back Pain    HPI Diane Patel is a 37 y.o. female.  The history is provided by the patient.   37 year old female with past medical history as below here with persistent cough and possible symptoms.  The patient was diagnosed with influenza approximately 3-4 weeks ago.  She took a course of Tamiflu.  She states that since then, she is had persistent and worsening cough productive of yellow-green and occasionally white sputum.  She is also had intermittent sharp abdominal pain and pain radiating up towards her bilateral flanks.  She said associated nausea and decreased appetite.  Denies any persistent fevers but has had some chills.  Her cough is been persistent.  She was diagnosed with the same, her husband, who has now completely resolved.  Denies any history of pneumonia.  She does have some mild urinary frequency as well as some blood when she wiped after urinating.  Denies any blood in her stool.  Denies any vaginal bleeding or discharge and she was just seen by her OB this week.  Past Medical History:  Diagnosis Date  . Allergy   . Asthma   . Back pain   . Depression   . Headaches, cluster    frequent  . Hyperlipidemia   . Obesity, morbid ([place])   . OSA (obstructive sleep apnea)    01-2014 dignosed and CPAP titrated    Patient Active Problem List   Diagnosis Date Noted  . Obesity hypoventilation syndrome ([place]) 04/27/2014  . Morbid obesity ([place]) 04/27/2014  . Nocturia more than twice per night 04/27/2014  . Sleep related headaches 04/27/2014  . 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 OSA (obstructive sleep apnea) 09/14/2013  . Upper airway cough syndrome 07/27/2013  . SOB (shortness of breath) 07/09/2013  . Family history of systemic lupus erythematosus 07/09/2013  . Left arm  pain 06/21/2013  . 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 Edema 06/21/2013  . Anemia 12/22/2012  . Routine general medical examination at a health care facility 12/22/2012  . Migraine 12/21/2012  . Asthma 07/13/2010  . MENORRHAGIA 03/30/2010  . ECZEMA 03/30/2010  . DYSPEPSIA 09/05/2009  . POLYCYSTIC OVARIAN DISEASE 09/04/2009  . OBESITY 09/04/2009  . DEPRESSION 09/04/2009  . GERD 09/04/2009  . FATIGUE 09/04/2009  . CHEST PAIN, ATYPICAL 09/04/2009    Past Surgical History:  Procedure Laterality Date  . APPENDECTOMY  2004  . CHOLECYSTECTOMY  2010  . TONSILLECTOMY AND ADENOIDECTOMY  1985     OB History   None      Home Medications    Prior to Admission medications   Medication Sig Start Date End Date Taking? Authorizing Provider  acetaminophen (TYLENOL) 500 MG tablet Take 1,000 mg by mouth every 6 (six) hours as needed for headache.   Yes [provider]  albuterol (PROVENTIL HFA;VENTOLIN HFA) 108 (90 Base) MCG/ACT inhaler Inhale 1-2 puffs into the lungs every 6 (six) hours as needed for wheezing or shortness of breath. 04/18/17  Yes Khatri, Hina, PA-C  Ascorbic Acid (VITAMIN C PO) Take 1 tablet by mouth daily.   Yes [provider]  fluticasone (FLONASE) 50 MCG/ACT nasal spray Place 1 spray into both nostrils daily. 04/18/17  Yes Khatri, Hina, PA-C  ibuprofen (ADVIL,MOTRIN) 200 MG tablet Take 600 mg by mouth daily as needed.  Yes [provider]  ORSYTHIA 0.1-20 MG-MCG tablet Take 1 tablet by mouth daily. 02/17/17  Yes [provider]  azithromycin (ZITHROMAX) 250 MG tablet Take 1 tablet (250 mg total) by mouth daily. Take first 2 tablets together, then 1 every day until finished. Patient not taking: Reported on 05/07/2017 03/28/16   Recardo Evangelist, PA-C  azithromycin (ZITHROMAX) 250 MG tablet Take 1 tablet (250 mg total) by mouth daily. Take first 2 tablets together, then 1 every day until finished. Patient not taking: Reported on 05/07/2017 03/31/17    Mackuen, Courteney Lyn, MD  benzonatate (TESSALON) 200 MG capsule Take 1 capsule (200 mg total) by mouth 3 (three) times daily as needed for up to 7 days for cough. 05/07/17 05/14/17  Duffy Bruce, MD  carbamazepine (TEGRETOL) 100 MG chewable tablet Chew 1 tablet (100 mg total) by mouth 2 (two) times daily. Patient not taking: Reported on 05/07/2017 04/27/14   Dohmeier, Asencion Partridge, MD  doxycycline (VIBRAMYCIN) 100 MG capsule Take 1 capsule (100 mg total) by mouth 2 (two) times daily for 7 days. 05/07/17 05/14/17  Duffy Bruce, MD  ibuprofen (ADVIL,MOTRIN) 800 MG tablet Take 1 tablet (800 mg total) by mouth every 8 (eight) hours as needed for mild pain. Patient not taking: Reported on 05/07/2017 03/24/16   Ward, Delice Bison, DO  methocarbamol (ROBAXIN) 500 MG tablet Take 1 tablet (500 mg total) by mouth every 8 (eight) hours as needed for muscle spasms. Patient not taking: Reported on 05/07/2017 03/24/16   Ward, Delice Bison, DO  ondansetron (ZOFRAN ODT) 4 MG disintegrating tablet Take 1 tablet (4 mg total) by mouth every 8 (eight) hours as needed for nausea or vomiting. 05/07/17   Duffy Bruce, MD  oseltamivir (TAMIFLU) 75 MG capsule Take 1 capsule (75 mg total) by mouth every 12 (twelve) hours. Patient not taking: Reported on 05/07/2017 04/18/17   Delia Heady, PA-C    Family History Family History  Problem Relation Age of Onset  . Cancer Maternal Grandmother        ? stomach cancer  . Diabetes Maternal Grandmother   . Hypertension Mother   . Diabetes Mother   . Heart disease Mother   . Polycystic ovary syndrome Mother   . Cancer - Ovarian Mother   . Diabetes Father   . Hypertension Father   . Emphysema Father        smoked  . Diabetes Sister   . Pancreatitis Sister   . Cancer Maternal Grandfather        bone  . Glaucoma Maternal Grandfather   . Osteoporosis Maternal Grandfather   . Hypertension Paternal Grandfather   . Mental illness Paternal Grandfather   . Asthma Brother     Social  History Social History   Tobacco Use  . Smoking status: Never Smoker  . Smokeless tobacco: Never Used  Substance Use Topics  . Alcohol use: No  . Drug use: No     Allergies   Ace inhibitors; Pineapple; Chocolate; Flexeril [cyclobenzaprine]; Lactose intolerance (gi); and Naproxen   Review of Systems Review of Systems  Constitutional: Positive for fatigue. Negative for chills and fever.  HENT: Negative for congestion and rhinorrhea.   Eyes: Negative for visual disturbance.  Respiratory: Positive for cough. Negative for shortness of breath and wheezing.   Cardiovascular: Negative for chest pain and leg swelling.  Gastrointestinal: Positive for abdominal pain and nausea. Negative for diarrhea and vomiting.  Genitourinary: Positive for hematuria. Negative for dysuria and flank pain.  Musculoskeletal:  Negative for neck pain and neck stiffness.  Skin: Negative for rash and wound.  Allergic/Immunologic: Negative for immunocompromised state.  Neurological: Negative for syncope, weakness and headaches.  All other systems reviewed and are negative.    Physical Exam Updated Vital Signs BP (!) 159/105   Pulse 80   Resp 18   Ht 5\' 3"  (1.6 m)   Wt 129.3 kg (285 lb)   LMP 04/18/2017   SpO2 97%   BMI 50.49 kg/m   Physical Exam  Constitutional: She is oriented to person, place, and time. She appears well-developed and well-nourished. No distress.  HENT:  Head: Normocephalic and atraumatic.  Eyes: Conjunctivae are normal.  Neck: Neck supple.  Cardiovascular: Normal rate, regular rhythm and normal heart sounds. Exam reveals no friction rub.  No murmur heard. Pulmonary/Chest: Effort normal and breath sounds normal. No respiratory distress. She has no wheezes. She has no rales.  Abdominal: Soft. Bowel sounds are normal. There is tenderness (Suprapubic). There is no rebound and no guarding.  Musculoskeletal: She exhibits no edema.  Neurological: She is alert and oriented to person,  place, and time. She exhibits normal muscle tone.  Skin: Skin is warm. Capillary refill takes less than 2 seconds.  Psychiatric: She has a normal mood and affect.  Nursing note and vitals reviewed.    ED Treatments / Results  Labs (all labs ordered are listed, but only abnormal results are displayed) Labs Reviewed  URINALYSIS, ROUTINE W REFLEX MICROSCOPIC - Abnormal; Notable for the following components:      Result Value   Hgb urine dipstick SMALL (*)    Ketones, ur 5 (*)    Squamous Epithelial / LPF 0-5 (*)    All other components within normal limits  CBC WITH DIFFERENTIAL/PLATELET  COMPREHENSIVE METABOLIC PANEL  LIPASE, BLOOD  I-STAT BETA HCG BLOOD, ED ([facility], [facility], [facility] ONLY)    EKG None  Radiology Dg Chest 2 View  Result Date: 05/07/2017 CLINICAL DATA:  Productive cough. EXAM: CHEST - 2 VIEW COMPARISON:  03/28/2016 FINDINGS: The lungs are clear without focal pneumonia, edema, pneumothorax or pleural effusion. The cardiopericardial silhouette is within normal limits for size. The visualized bony structures of the thorax are intact. IMPRESSION: No active cardiopulmonary disease. Electronically Signed   By: Misty Stanley M.D.   On: 05/07/2017 11:33    Procedures Procedures (including critical care time)  Medications Ordered in ED Medications  sodium chloride 0.9 % bolus 1,000 mL (0 mLs Intravenous Stopped 05/07/17 1324)  albuterol (PROVENTIL HFA;VENTOLIN HFA) 108 (90 Base) MCG/ACT inhaler 2 puff (2 puffs Inhalation Given 05/07/17 1429)  AEROCHAMBER PLUS FLO-VU MEDIUM MISC 1 each (1 each Other Given 05/07/17 1429)     Initial Impression / Assessment and Plan / ED Course  I have reviewed the triage vital signs and the nursing notes.  Pertinent labs & imaging results that were available during my care of the patient were reviewed by me and considered in my medical decision making (see chart for details).  Clinical Course as of May 07 1605  Wed May 07, 2017  1120 37 yo F here with  persistent cough, intermittent abd pain s/p influenza. VSS on arrival and pt is afebrile. No signs of sepsis. Will check CXR, UA, and screening labs. Concern for post-influenza PNA, UTI. No vaginal sx, s/p recent OB appointment, doubt PUD.   [CI]  1318 Labs reassuring, no signs of sepsis. UA pending. Pt feeling improved with iVF.   [CI]  1403 UA negative. Suspect  possible occult bronchitis, generalized dehydration and pain 2/2 recent influenza. Given sputum production and duration of sx, will tx with doxy, supportive care. Labs are otherwise very reassuring. PT is tolerating PO in th ED. Discharge home.   [CI]  6378 Serial abd exam benign. No focal TTP to suggest cholecystitis, appendicitis, obstruction, or other complications/emergencies.   [CI]    Clinical Course User Index [CI] Duffy Bruce, MD     Final Clinical Impressions(s) / ED Diagnoses   Final diagnoses:  Dehydration  Bronchitis    ED Discharge Orders        Ordered    doxycycline (VIBRAMYCIN) 100 MG capsule  2 times daily     05/07/17 1406    benzonatate (TESSALON) 200 MG capsule  3 times daily PRN     05/07/17 1406    ondansetron (ZOFRAN ODT) 4 MG disintegrating tablet  Every 8 hours PRN     05/07/17 1406       Duffy Bruce, MD 05/07/17 1607

## 2018-03-04 ENCOUNTER — Encounter (HOSPITAL_BASED_OUTPATIENT_CLINIC_OR_DEPARTMENT_OTHER): Payer: Self-pay | Admitting: *Deleted

## 2018-03-04 ENCOUNTER — Emergency Department (HOSPITAL_BASED_OUTPATIENT_CLINIC_OR_DEPARTMENT_OTHER)
Admission: EM | Admit: 2018-03-04 | Discharge: 2018-03-04 | Disposition: A | Discharge status: Home / Self Care | Payer: 59 | Attending: Emergency Medicine | Admitting: Emergency Medicine

## 2018-03-04 ENCOUNTER — Emergency Department (HOSPITAL_BASED_OUTPATIENT_CLINIC_OR_DEPARTMENT_OTHER): Payer: 59

## 2018-03-04 ENCOUNTER — Other Ambulatory Visit: Payer: Self-pay

## 2018-03-04 DIAGNOSIS — J45909 Unspecified asthma, uncomplicated: Secondary | ICD-10-CM | POA: Insufficient documentation

## 2018-03-04 DIAGNOSIS — R079 Chest pain, unspecified: Secondary | ICD-10-CM | POA: Insufficient documentation

## 2018-03-04 DIAGNOSIS — R0602 Shortness of breath: Secondary | ICD-10-CM | POA: Insufficient documentation

## 2018-03-04 DIAGNOSIS — Z79899 Other long term (current) drug therapy: Secondary | ICD-10-CM | POA: Insufficient documentation

## 2018-03-04 LAB — CBC WITH DIFFERENTIAL/PLATELET
Abs Immature Granulocytes: 0.02 10*3/uL (ref 0.00–0.07)
BASOS ABS: 0 10*3/uL (ref 0.0–0.1)
BASOS PCT: 1 %
EOS ABS: 0.1 10*3/uL (ref 0.0–0.5)
EOS PCT: 2 %
HEMATOCRIT: 42.3 % (ref 36.0–46.0)
Hemoglobin: 12.6 g/dL (ref 12.0–15.0)
IMMATURE GRANULOCYTES: 0 %
LYMPHS ABS: 2.6 10*3/uL (ref 0.7–4.0)
Lymphocytes Relative: 38 %
MCH: 24 pg — ABNORMAL LOW (ref 26.0–34.0)
MCHC: 29.8 g/dL — AB (ref 30.0–36.0)
MCV: 80.6 fL (ref 80.0–100.0)
Monocytes Absolute: 0.4 10*3/uL (ref 0.1–1.0)
Monocytes Relative: 6 %
NEUTROS PCT: 53 %
NRBC: 0 % (ref 0.0–0.2)
Neutro Abs: 3.6 10*3/uL (ref 1.7–7.7)
PLATELETS: 345 10*3/uL (ref 150–400)
RBC: 5.25 MIL/uL — ABNORMAL HIGH (ref 3.87–5.11)
RDW: 14.7 % (ref 11.5–15.5)
WBC: 6.8 10*3/uL (ref 4.0–10.5)

## 2018-03-04 LAB — COMPREHENSIVE METABOLIC PANEL
ALBUMIN: 4 g/dL (ref 3.5–5.0)
ALT: 17 U/L (ref 0–44)
ANION GAP: 9 (ref 5–15)
AST: 18 U/L (ref 15–41)
Alkaline Phosphatase: 54 U/L (ref 38–126)
BILIRUBIN TOTAL: 1 mg/dL (ref 0.3–1.2)
BUN: 13 mg/dL (ref 6–20)
CHLORIDE: 104 mmol/L (ref 98–111)
CO2: 25 mmol/L (ref 22–32)
Calcium: 9 mg/dL (ref 8.9–10.3)
Creatinine, Ser: 0.82 mg/dL (ref 0.44–1.00)
GFR calc Af Amer: >60 mL/min (ref >60)
GLUCOSE: 80 mg/dL (ref 70–99)
POTASSIUM: 3.8 mmol/L (ref 3.5–5.1)
Sodium: 138 mmol/L (ref 135–145)
TOTAL PROTEIN: 7.4 g/dL (ref 6.5–8.1)

## 2018-03-04 LAB — PREGNANCY, URINE: PREG TEST UR: NEGATIVE

## 2018-03-04 LAB — D-DIMER, QUANTITATIVE: D-Dimer, Quant: <0.27 ug/mL-FEU (ref 0.00–0.50)

## 2018-03-04 LAB — TROPONIN I

## 2018-03-04 MED ORDER — ALUM & MAG HYDROXIDE-SIMETH 200-200-20 MG/5ML PO SUSP
15.0000 mL | Freq: Once | ORAL | Status: AC
  Administered 2018-03-04: 15 mL via ORAL
  Filled 2018-03-04: qty 30

## 2018-03-04 MED ORDER — LIDOCAINE VISCOUS HCL 2 % MT SOLN
15.0000 mL | Freq: Once | OROMUCOSAL | Status: AC
  Administered 2018-03-04: 15 mL via OROMUCOSAL
  Filled 2018-03-04: qty 15

## 2018-03-04 MED ORDER — KETOROLAC TROMETHAMINE 15 MG/ML IJ SOLN
15.0000 mg | Freq: Once | INTRAMUSCULAR | Status: AC
Start: 2018-03-04 — End: 2018-03-04
  Administered 2018-03-04: 15 mg via INTRAVENOUS
  Filled 2018-03-04: qty 1

## 2018-03-04 NOTE — ED Triage Notes (Addendum)
Pt c/o SOb x 1 week with exertion and " palpitations" and h/a

## 2018-03-04 NOTE — Discharge Instructions (Addendum)
Your work-up has been reassuring.  No signs of a blood clot or heart attack in the ED today.  Would recommend that you follow-up with your primary care doctor to have a thorough physical and further referral if needed.  Can try over-the-counter acid reflux medication such as Prilosec.  Drink plenty of fluids stay hydrated.  Motrin and Tylenol for pain.  If you develop any worsening symptoms return the ED for further evaluation.

## 2018-03-05 NOTE — ED Provider Notes (Signed)
[facility] EMERGENCY DEPARTMENT Provider Note   CSN: [id number] Arrival date & time: 03/04/18  1744     History   Chief Complaint Chief Complaint  Patient presents with  . Shortness of Breath    HPI Diane Patel is a 38 y.o. female.  HPI 38 year old female past medical history significant for obesity presents to the emergency department today for evaluation of chest pain.  Patient states that her pain has been intermittent for the past 5 weeks.  She reports some pleuritic pain.  She states the pain is middle and radiates to her left arm.  States that the pain started today approximately 5:30 AM has been constant.  Tried Tylenol without any relief.  Describes the pain as pressure.  Reports associated shortness of breath.  Reports deep breaths make the pain worse.  Not exertional.  She reports intermittent headache.  She denies any cardiac history.  No significant family cardiac history but no history of diabetes, hypertension or high cholesterol.  She denies any history of PE/DVT, prolonged immobilization, recent hospitalization/surgeries, unilateral leg swelling or calf tenderness does note that she used birth control 1.5 months ago but is not currently taking any birth control.  Patient is concerned given her weight that there is something wrong with her heart.  No relieving or aggravating factors. Past Medical History:  Diagnosis Date  . Allergy   . Asthma   . Back pain   . Depression   . Headaches, cluster    frequent  . Hyperlipidemia   . Obesity, morbid ([place])   . OSA (obstructive sleep apnea)    01-2014 dignosed and CPAP titrated    Patient Active Problem List   Diagnosis Date Noted  . Obesity hypoventilation syndrome ([place]) 04/27/2014  . Morbid obesity ([place]) 04/27/2014  . Nocturia more than twice per night 04/27/2014  . Sleep related headaches 04/27/2014  . 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 OSA (obstructive sleep apnea) 09/14/2013  . Upper airway cough  syndrome 07/27/2013  . SOB (shortness of breath) 07/09/2013  . Family history of systemic lupus erythematosus 07/09/2013  . Left arm pain 06/21/2013  . 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 Edema 06/21/2013  . Anemia 12/22/2012  . Routine general medical examination at a health care facility 12/22/2012  . Migraine 12/21/2012  . Asthma 07/13/2010  . MENORRHAGIA 03/30/2010  . ECZEMA 03/30/2010  . DYSPEPSIA 09/05/2009  . POLYCYSTIC OVARIAN DISEASE 09/04/2009  . OBESITY 09/04/2009  . DEPRESSION 09/04/2009  . GERD 09/04/2009  . FATIGUE 09/04/2009  . CHEST PAIN, ATYPICAL 09/04/2009    Past Surgical History:  Procedure Laterality Date  . APPENDECTOMY  2004  . CHOLECYSTECTOMY  2010  . TONSILLECTOMY AND ADENOIDECTOMY  1985     OB History   No obstetric history on file.      Home Medications    Prior to Admission medications   Medication Sig Start Date End Date Taking? Authorizing Provider  acetaminophen (TYLENOL) 500 MG tablet Take 1,000 mg by mouth every 6 (six) hours as needed for headache.    [provider]  albuterol (PROVENTIL HFA;VENTOLIN HFA) 108 (90 Base) MCG/ACT inhaler Inhale 1-2 puffs into the lungs every 6 (six) hours as needed for wheezing or shortness of breath. 04/18/17   Khatri, Hina, PA-C  Ascorbic Acid (VITAMIN C PO) Take 1 tablet by mouth daily.    [provider]  azithromycin (ZITHROMAX) 250 MG tablet Take 1 tablet (250 mg total) by mouth daily. Take first 2 tablets together, then 1  every day until finished. Patient not taking: Reported on 05/07/2017 03/28/16   Recardo Evangelist, PA-C  azithromycin (ZITHROMAX) 250 MG tablet Take 1 tablet (250 mg total) by mouth daily. Take first 2 tablets together, then 1 every day until finished. Patient not taking: Reported on 05/07/2017 03/31/17   Mackuen, Fredia Sorrow, MD  carbamazepine (TEGRETOL) 100 MG chewable tablet Chew 1 tablet (100 mg total) by mouth 2 (two) times daily. Patient not taking: Reported on 05/07/2017  04/27/14   Dohmeier, Asencion Partridge, MD  fluticasone [facility]) 50 MCG/ACT nasal spray Place 1 spray into both nostrils daily. 04/18/17   Khatri, Hina, PA-C  ibuprofen (ADVIL,MOTRIN) 200 MG tablet Take 600 mg by mouth daily as needed.    [provider]  ibuprofen (ADVIL,MOTRIN) 800 MG tablet Take 1 tablet (800 mg total) by mouth every 8 (eight) hours as needed for mild pain. Patient not taking: Reported on 05/07/2017 03/24/16   Ward, Delice Bison, DO  methocarbamol (ROBAXIN) 500 MG tablet Take 1 tablet (500 mg total) by mouth every 8 (eight) hours as needed for muscle spasms. Patient not taking: Reported on 05/07/2017 03/24/16   Ward, Delice Bison, DO  ondansetron (ZOFRAN ODT) 4 MG disintegrating tablet Take 1 tablet (4 mg total) by mouth every 8 (eight) hours as needed for nausea or vomiting. 05/07/17   Duffy Bruce, MD  ORSYTHIA 0.1-20 MG-MCG tablet Take 1 tablet by mouth daily. 02/17/17   [provider]  oseltamivir (TAMIFLU) 75 MG capsule Take 1 capsule (75 mg total) by mouth every 12 (twelve) hours. Patient not taking: Reported on 05/07/2017 04/18/17   Delia Heady, PA-C    Family History Family History  Problem Relation Age of Onset  . Cancer Maternal Grandmother        ? stomach cancer  . Diabetes Maternal Grandmother   . Hypertension Mother   . Diabetes Mother   . Heart disease Mother   . Polycystic ovary syndrome Mother   . Cancer - Ovarian Mother   . Diabetes Father   . Hypertension Father   . Emphysema Father        smoked  . Diabetes Sister   . Pancreatitis Sister   . Cancer Maternal Grandfather        bone  . Glaucoma Maternal Grandfather   . Osteoporosis Maternal Grandfather   . Hypertension Paternal Grandfather   . Mental illness Paternal Grandfather   . Asthma Brother     Social History Social History   Tobacco Use  . Smoking status: Never Smoker  . Smokeless tobacco: Never Used  Substance Use Topics  . Alcohol use: No  . Drug use: No     Allergies   Ace  inhibitors; Pineapple; Chocolate; Flexeril [cyclobenzaprine]; Lactose intolerance (gi); and Naproxen   Review of Systems Review of Systems  Constitutional: Negative for chills and fever.  HENT: Negative for congestion.   Eyes: Negative for visual disturbance.  Respiratory: Positive for shortness of breath. Negative for cough.   Cardiovascular: Negative for chest pain, palpitations and leg swelling.  Gastrointestinal: Negative for abdominal pain, diarrhea, nausea and vomiting.  Genitourinary: Negative for hematuria.  Musculoskeletal: Negative for myalgias.  Skin: Negative for color change.  Neurological: Positive for headaches. Negative for dizziness, syncope, weakness and light-headedness.     Physical Exam Updated Vital Signs BP (!) 131/101   Pulse 66   Temp 98.6 F (37 C) (Oral)   Resp 13   Ht 5\' 3"  (1.6 m)   Wt (!) 137  kg   SpO2 100%   BMI 53.52 kg/m   Physical Exam Vitals signs and nursing note reviewed.  Constitutional:      General: She is not in acute distress.    Appearance: She is well-developed. She is not toxic-appearing or diaphoretic.  HENT:     Head: Normocephalic and atraumatic.     Nose: Nose normal.  Eyes:     General:        Right eye: No discharge.        Left eye: No discharge.     Conjunctiva/sclera: Conjunctivae normal.     Pupils: Pupils are equal, round, and reactive to light.  Neck:     Musculoskeletal: Normal range of motion and neck supple.     Vascular: No JVD.     Trachea: No tracheal deviation.  Cardiovascular:     Rate and Rhythm: Normal rate and regular rhythm.     Heart sounds: Normal heart sounds. No murmur. No friction rub. No gallop.   Pulmonary:     Effort: Pulmonary effort is normal. No respiratory distress.     Breath sounds: Normal breath sounds.  Chest:     Chest wall: Tenderness present.  Abdominal:     General: Bowel sounds are normal. There is no distension.     Palpations: Abdomen is soft.     Tenderness: There  is no abdominal tenderness. There is no guarding or rebound.  Musculoskeletal: Normal range of motion.     Comments: No lower extremity edema or calf tenderness.  Lymphadenopathy:     Cervical: No cervical adenopathy.  Skin:    General: Skin is warm and dry.     Capillary Refill: Capillary refill takes less than 2 seconds.  Neurological:     Mental Status: She is alert and oriented to person, place, and time.  Psychiatric:        Behavior: Behavior normal.        Thought Content: Thought content normal.        Judgment: Judgment normal.      ED Treatments / Results  Labs (all labs ordered are listed, but only abnormal results are displayed) Labs Reviewed  CBC WITH DIFFERENTIAL/PLATELET - Abnormal; Notable for the following components:      Result Value   RBC 5.25 (*)    MCH 24.0 (*)    MCHC 29.8 (*)    All other components within normal limits  PREGNANCY, URINE  COMPREHENSIVE METABOLIC PANEL  TROPONIN I  D-DIMER, QUANTITATIVE (NOT AT [facility])    EKG EKG Interpretation  Date/Time:  Wednesday March 04 2018 18:00:56 EST Ventricular Rate:  74 PR Interval:  150 QRS Duration: 86 QT Interval:  384 QTC Calculation: 426 R Axis:   57 Text Interpretation:  Normal sinus rhythm Normal ECG No significant change since last tracing Confirmed by Blanchie Dessert [phone]) on 03/04/2018 6:10:41 PM   Radiology Dg Chest 2 View  Result Date: 03/04/2018 CLINICAL DATA:  Shortness of breath. EXAM: CHEST - 2 VIEW COMPARISON:  Radiographs of May 07, 2017. FINDINGS: The heart size and mediastinal contours are within normal limits. Both lungs are clear. No pneumothorax or pleural effusion is noted. The visualized skeletal structures are unremarkable. IMPRESSION: No active cardiopulmonary disease. Electronically Signed   By: Marijo Conception, M.D.   On: 03/04/2018 19:14    Procedures Procedures (including critical care time)  Medications Ordered in ED Medications  alum & mag  hydroxide-simeth (MAALOX/MYLANTA) 200-200-20 MG/5ML suspension 15 mL (15  mLs Oral Given 03/04/18 2011)  lidocaine (XYLOCAINE) 2 % viscous mouth solution 15 mL (15 mLs Mouth/Throat Given 03/04/18 2013)  ketorolac (TORADOL) 15 MG/ML injection 15 mg (15 mg Intravenous Given 03/04/18 2137)     Initial Impression / Assessment and Plan / ED Course  I have reviewed the triage vital signs and the nursing notes.  Pertinent labs & imaging results that were available during my care of the patient were reviewed by me and considered in my medical decision making (see chart for details).     Pt presents to the Ed today with complaints of cp. Patient is to be discharged with recommendation to follow up with PCP in regards to today's hospital visit. Chest pain is not likely of cardiac or pulmonary etiology d/t presentation, perc negative/d-dimer negative, VSS, no tracheal deviation, no JVD or new murmur, RRR, breath sounds equal bilaterally, EKG without any change from prior tracing and shows no signs of ischemia, negative troponin, heart pathway score is 1, given symptoms lasting greater than 6 hours no indication for repeat troponin, and negative CXR. Pt has been advised to return to the ED is CP becomes exertional, associated with diaphoresis or nausea, radiates to left jaw/arm, worsens or becomes concerning in any way.   Pt is hemodynamically stable, in NAD, & able to ambulate in the ED. Evaluation does not show pathology that would require ongoing emergent intervention or inpatient treatment. I explained the diagnosis to the patient. Pain has been managed & has no complaints prior to dc. Pt is comfortable with above plan and is stable for discharge at this time. All questions were answered prior to disposition. Strict return precautions for f/u to the ED were discussed. Encouraged follow up with PCP.      Final Clinical Impressions(s) / ED Diagnoses   Final diagnoses:  Shortness of breath  Chest pain,  unspecified type    ED Discharge Orders    None       Aaron Edelman 03/05/18 0756    Blanchie Dessert, MD 03/07/18 2154

## 2018-03-19 ENCOUNTER — Ambulatory Visit: Payer: Self-pay | Admitting: Cardiology

## 2018-03-27 ENCOUNTER — Other Ambulatory Visit: Payer: Self-pay | Admitting: Obstetrics & Gynecology

## 2018-03-27 DIAGNOSIS — N979 Female infertility, unspecified: Secondary | ICD-10-CM

## 2018-04-13 NOTE — Progress Notes (Signed)
Patient referred by Glendon Axe, MD for chest pain  Subjective:   Diane Patel, female    DOB: 1980/06/06, 38 y.o.   MRN: [id number]   Chief Complaint  Patient presents with  . Chest Pain    NP Eval     HPI  38 y.o. African American female with obesity, here for complaints of chest pain.  Patient has had intermittent chest pain for several weeks.  Pain would occur on bending over, and at rest. It never occurred with physical exertion. She was seen in the eergency department in 02/2018 with chest pain and shortness of breath.  Troponin and d-dimer were negative.Marland Kitchen  She was referred to [place] for further evaluation. Her symptoms have resolved at this time.  She lives with her husband. She works part time work at [employer]. She does not do any regular exercise.   BP has been elevated at prior reports. Elevated again today.   Past Medical History:  Diagnosis Date  . Allergy   . Asthma   . Back pain   . Depression   . Headaches, cluster    frequent  . Hyperlipidemia   . Obesity, morbid ([place])   . OSA (obstructive sleep apnea)    01-2014 dignosed and CPAP titrated     Past Surgical History:  Procedure Laterality Date  . APPENDECTOMY  2004  . CHOLECYSTECTOMY  2010  . TONSILLECTOMY AND ADENOIDECTOMY  1985     Social History   Socioeconomic History  . Marital status: Married    Spouse name: Not on file  . Number of children: 0  . Years of education: coll  . Highest education level: Not on file  Occupational History  . Occupation: unemployed  Social Needs  . Financial resource strain: Not on file  . Food insecurity:    Worry: Not on file    Inability: Not on file  . Transportation needs:    Medical: Not on file    Non-medical: Not on file  Tobacco Use  . Smoking status: Never Smoker  . Smokeless tobacco: Never Used  Substance and Sexual Activity  . Alcohol use: No  . Drug use: No  . Sexual activity: Not on file  Lifestyle  . Physical activity:      Days per week: Not on file    Minutes per session: Not on file  . Stress: Not on file  Relationships  . Social connections:    Talks on phone: Not on file    Gets together: Not on file    Attends religious service: Not on file    Active member of club or organization: Not on file    Attends meetings of clubs or organizations: Not on file    Relationship status: Not on file  . Intimate partner violence:    Fear of current or ex partner: Not on file    Emotionally abused: Not on file    Physically abused: Not on file    Forced sexual activity: Not on file  Other Topics Concern  . Not on file  Social History Narrative   Regular exercise   Caffeine none.     Current Outpatient Medications on File Prior to Visit  Medication Sig Dispense Refill  . acetaminophen (TYLENOL) 500 MG tablet Take 1,000 mg by mouth every 6 (six) hours as needed for headache.    . albuterol (PROVENTIL HFA;VENTOLIN HFA) 108 (90 Base) MCG/ACT inhaler Inhale 1-2 puffs into the lungs every 6 (six) hours  as needed for wheezing or shortness of breath. 1 Inhaler 0  . Ascorbic Acid (VITAMIN C PO) Take 1 tablet by mouth daily.    . fluticasone (FLONASE) 50 MCG/ACT nasal spray Place 1 spray into both nostrils daily. 16 g 2  . methocarbamol (ROBAXIN) 500 MG tablet Take 1 tablet (500 mg total) by mouth every 8 (eight) hours as needed for muscle spasms. 15 tablet 0  . ondansetron (ZOFRAN ODT) 4 MG disintegrating tablet Take 1 tablet (4 mg total) by mouth every 8 (eight) hours as needed for nausea or vomiting. 20 tablet 0  . Prenatal Vit-Iron Carbonyl-FA (PNV TABS 29-1) 29-1 MG TABS Take 1 tablet by mouth daily.     No current facility-administered medications on file prior to visit.     Cardiovascular studies:  EKG 03/04/18: Normal sinus rhythm. Normal ECG.  Echocardiogram (Outside) 06/30/2013: Left ventricle: The cavity size was normal. Wall thickness wasincreased in a pattern of mild LVH. Systolic function was  normal. The estimated ejection fraction was in the range of 55% to 60%.Wall motion was normal; there were no regional wall motionabnormalities. Left ventricular diastolic function parameterswere normal.  Sleep Study 2015: OSA on CPAP.  Recent labs:   CBC Latest Ref Rng & Units 03/04/2018  WBC 4.0 - 10.5 K/uL 6.8  Hemoglobin 12.0 - 15.0 g/dL 12.6  Hematocrit 36.0 - 46.0 % 42.3  Platelets 150 - 400 K/uL 345   CMP Latest Ref Rng & Units 03/04/2018  Glucose 70 - 99 mg/dL 80  BUN 6 - 20 mg/dL 13  Creatinine 0.44 - 1.00 mg/dL 0.82  Sodium 135 - 145 mmol/L 138  Potassium 3.5 - 5.1 mmol/L 3.8  Chloride 98 - 111 mmol/L 104  CO2 22 - 32 mmol/L 25  Calcium 8.9 - 10.3 mg/dL 9.0  Total Protein 6.5 - 8.1 g/dL 7.4  Total Bilirubin 0.3 - 1.2 mg/dL 1.0  Alkaline Phos 38 - 126 U/L 54  AST 15 - 41 U/L 18  ALT 0 - 44 U/L 17     Review of Systems  Constitution: Negative for decreased appetite, malaise/fatigue, weight gain and weight loss.  HENT: Negative for congestion.   Eyes: Negative for visual disturbance.  Cardiovascular: Positive for chest pain and dyspnea on exertion. Negative for leg swelling, palpitations and syncope.  Respiratory: Positive for shortness of breath.   Endocrine: Negative for cold intolerance.  Hematologic/Lymphatic: Does not bruise/bleed easily.  Skin: Negative for itching and rash.  Musculoskeletal: Negative for myalgias.  Gastrointestinal: Negative for abdominal pain, nausea and vomiting.  Genitourinary: Negative for dysuria.  Neurological: Negative for dizziness and weakness.  Psychiatric/Behavioral: The patient is not nervous/anxious.   All other systems reviewed and are negative.        Vitals:   04/15/18 1057  BP: (!) 147/97  Pulse: 69  SpO2: 97%    Objective:   Physical Exam  Constitutional: She appears well-developed and well-nourished. No distress.  HENT:  Head: Atraumatic.  Eyes: Conjunctivae are normal.  Neck: Neck supple. No JVD  present. No thyromegaly present.  Cardiovascular: Normal rate, regular rhythm, normal heart sounds and intact distal pulses. Exam reveals no gallop.  No murmur heard. Pulmonary/Chest: Effort normal and breath sounds normal.  Abdominal: Soft. Bowel sounds are normal.  Musculoskeletal: Normal range of motion.        General: No edema.  Neurological: She is alert.  Skin: Skin is warm and dry.  Psychiatric: She has a normal mood and affect.  Assessment & Recommendations:    38 y.o. African American female with obesity, atypical chest pain  Chest pain: Noncardiac by history. Normal echocardiogram in 2015. No further workup necessary at this time.  Hypertension: She is considering pregnancy. I started her on hydralazine 50 mg bid.  I recommended heart healthy diet that includes more of lean meat, fish, fruits, vegetables, nuts, olives (Meditarranean diet) and less of processed food, red meat, dairy, cheese, fried food, sugar, excess salt.  I encouraged her to establish care with a PCP. I will see her back in 4 weeks to monitor her blood pressure.    Thank you for referring the patient to [place]. Please feel free to contact with any questions.  Nigel Mormon, MD [facility] Pager: [phone] Office: [phone] If no answer Cell [phone]

## 2018-04-14 ENCOUNTER — Other Ambulatory Visit: Payer: 59

## 2018-04-15 ENCOUNTER — Ambulatory Visit (INDEPENDENT_AMBULATORY_CARE_PROVIDER_SITE_OTHER): Payer: 59 | Admitting: Cardiology

## 2018-04-15 ENCOUNTER — Encounter: Payer: Self-pay | Admitting: Cardiology

## 2018-04-15 ENCOUNTER — Other Ambulatory Visit: Payer: Self-pay

## 2018-04-15 ENCOUNTER — Other Ambulatory Visit: Payer: 59

## 2018-04-15 VITALS — BP 147/97 | HR 69 | Ht 62.5 in | Wt 308.0 lb

## 2018-04-15 DIAGNOSIS — R0789 Other chest pain: Secondary | ICD-10-CM

## 2018-04-15 DIAGNOSIS — Z13220 Encounter for screening for lipoid disorders: Secondary | ICD-10-CM

## 2018-04-15 DIAGNOSIS — I10 Essential (primary) hypertension: Secondary | ICD-10-CM

## 2018-04-15 HISTORY — DX: Essential (primary) hypertension: I10

## 2018-04-15 MED ORDER — HYDRALAZINE HCL 50 MG PO TABS: 50.0000 mg | ORAL_TABLET | Freq: Two times a day (BID) | ORAL | 2 refills | Status: DC

## 2018-04-16 ENCOUNTER — Ambulatory Visit
Admission: RE | Admit: 2018-04-16 | Discharge: 2018-04-16 | Disposition: A | Discharge status: Home / Self Care | Payer: 59 | Source: Ambulatory Visit | Attending: Obstetrics & Gynecology | Admitting: Obstetrics & Gynecology

## 2018-04-16 DIAGNOSIS — N979 Female infertility, unspecified: Secondary | ICD-10-CM

## 2018-04-16 LAB — LIPID PANEL
CHOL/HDL RATIO: 4.2 ratio (ref 0.0–4.4)
CHOLESTEROL TOTAL: 178 mg/dL (ref 100–199)
HDL: 42 mg/dL (ref >39)
LDL CALC: 123 mg/dL — AB (ref 0–99)
Triglycerides: 63 mg/dL (ref 0–149)
VLDL Cholesterol Cal: 13 mg/dL (ref 5–40)

## 2018-04-23 ENCOUNTER — Other Ambulatory Visit: Payer: Self-pay | Admitting: Obstetrics & Gynecology

## 2018-04-24 ENCOUNTER — Other Ambulatory Visit: Payer: Self-pay | Admitting: Obstetrics & Gynecology

## 2018-04-24 DIAGNOSIS — N979 Female infertility, unspecified: Secondary | ICD-10-CM

## 2018-05-21 ENCOUNTER — Encounter: Payer: Self-pay | Admitting: Cardiology

## 2018-05-21 ENCOUNTER — Other Ambulatory Visit: Payer: Self-pay

## 2018-05-21 ENCOUNTER — Ambulatory Visit (INDEPENDENT_AMBULATORY_CARE_PROVIDER_SITE_OTHER): Payer: 59 | Admitting: Cardiology

## 2018-05-21 VITALS — BP 119/90 | HR 88 | Ht 63.0 in | Wt 304.0 lb

## 2018-05-21 DIAGNOSIS — I10 Essential (primary) hypertension: Secondary | ICD-10-CM | POA: Diagnosis not present

## 2018-05-21 DIAGNOSIS — R072 Precordial pain: Secondary | ICD-10-CM | POA: Diagnosis not present

## 2018-05-21 MED ORDER — HYDRALAZINE HCL 50 MG PO TABS: 25.0000 mg | ORAL_TABLET | Freq: Two times a day (BID) | ORAL | 3 refills | Status: DC

## 2018-05-21 MED ORDER — HYDRALAZINE HCL 25 MG PO TABS: 25.0000 mg | ORAL_TABLET | Freq: Two times a day (BID) | ORAL | 3 refills | Status: DC

## 2018-05-21 NOTE — Progress Notes (Signed)
Virtual Visit via Video Note   Subjective:   Diane Patel, female    DOB: [birth year], 38 y.o.   MRN: [id number]   I connected with the patient on 05/21/18 by a video enabled telemedicine application and verified that I am speaking with the correct person using two identifiers.     I discussed the limitations of evaluation and management by telemedicine and the availability of in person appointments. The patient expressed understanding and agreed to proceed.   This visit type was conducted due to national recommendations for restrictions regarding the COVID-19 Pandemic (e.g. social distancing).  This format is felt to be most appropriate for this patient at this time.  All issues noted in this document were discussed and addressed.  No physical exam was performed (except for noted visual exam findings with Tele health visits).  The patient has consented to conduct a Tele health visit and understands insurance will be billed.     Chief complaint:  Chest pain   HPI  38 y.o. African American female with obesity, atypical chest pain, hypertension  I saw her in 04/2018. I suspected the chest pain to be of noncardiac etiology. I did not recommend stress testing. She had a normal echocardiogram in 2015. Given her hypertension and potential of pregnancy, I started her on hydralazine 50 mg bid.   Patient still has episodes of chest pain, mostly at rest and not with exertion. She has lost 4 lbs since her last visit with me. BP has improved.   Past Medical History:  Diagnosis Date  . Allergy   . Asthma   . Back pain   . Chest pain   . Depression   . Essential hypertension 04/15/2018  . Headaches, cluster    frequent  . Hyperlipidemia   . Obesity, morbid ([place])   . OSA (obstructive sleep apnea)    01-2014 dignosed and CPAP titrated     Past Surgical History:  Procedure Laterality Date  . APPENDECTOMY  2004  . CHOLECYSTECTOMY  2010  . TONSILLECTOMY AND ADENOIDECTOMY  1985      Social History   Socioeconomic History  . Marital status: Married    Spouse name: Not on file  . Number of children: 0  . Years of education: coll  . Highest education level: Not on file  Occupational History  . Occupation: unemployed  Social Needs  . Financial resource strain: Not on file  . Food insecurity:    Worry: Not on file    Inability: Not on file  . Transportation needs:    Medical: Not on file    Non-medical: Not on file  Tobacco Use  . Smoking status: Never Smoker  . Smokeless tobacco: Never Used  Substance and Sexual Activity  . Alcohol use: No  . Drug use: No  . Sexual activity: Not on file  Lifestyle  . Physical activity:    Days per week: Not on file    Minutes per session: Not on file  . Stress: Not on file  Relationships  . Social connections:    Talks on phone: Not on file    Gets together: Not on file    Attends religious service: Not on file    Active member of club or organization: Not on file    Attends meetings of clubs or organizations: Not on file    Relationship status: Not on file  . Intimate partner violence:    Fear of current or ex partner: Not on file  Emotionally abused: Not on file    Physically abused: Not on file    Forced sexual activity: Not on file  Other Topics Concern  . Not on file  Social History Narrative   Regular exercise   Caffeine none.     Family History  Problem Relation Age of Onset  . Cancer Maternal Grandmother        ? stomach cancer  . Diabetes Maternal Grandmother   . Hypertension Mother   . Diabetes Mother   . Heart disease Mother   . Polycystic ovary syndrome Mother   . Cancer - Ovarian Mother   . Diabetes Father   . Hypertension Father   . Emphysema Father        smoked  . Diabetes Sister   . Pancreatitis Sister   . Cancer Maternal Grandfather        bone  . Glaucoma Maternal Grandfather   . Osteoporosis Maternal Grandfather   . Hypertension Paternal Grandfather   . Mental illness  Paternal Grandfather   . Asthma Brother      Current Outpatient Medications on File Prior to Visit  Medication Sig Dispense Refill  . acetaminophen (TYLENOL) 500 MG tablet Take 1,000 mg by mouth every 6 (six) hours as needed for headache.    . albuterol (PROVENTIL HFA;VENTOLIN HFA) 108 (90 Base) MCG/ACT inhaler Inhale 1-2 puffs into the lungs every 6 (six) hours as needed for wheezing or shortness of breath. 1 Inhaler 0  . Ascorbic Acid (VITAMIN C PO) Take 1 tablet by mouth daily.    . fluticasone (FLONASE) 50 MCG/ACT nasal spray Place 1 spray into both nostrils daily. 16 g 2  . hydrALAZINE (APRESOLINE) 50 MG tablet Take 1 tablet (50 mg total) by mouth 2 (two) times daily. 60 tablet 2  . methocarbamol (ROBAXIN) 500 MG tablet Take 1 tablet (500 mg total) by mouth every 8 (eight) hours as needed for muscle spasms. 15 tablet 0  . ondansetron (ZOFRAN ODT) 4 MG disintegrating tablet Take 1 tablet (4 mg total) by mouth every 8 (eight) hours as needed for nausea or vomiting. 20 tablet 0  . Prenatal Vit-Iron Carbonyl-FA (PNV TABS 29-1) 29-1 MG TABS Take 1 tablet by mouth daily.     No current facility-administered medications on file prior to visit.     Cardiovascular studies:  EKG 03/04/18: Normal sinus rhythm. Normal ECG.  Echocardiogram (Outside) 06/30/2013: Left ventricle: The cavity size was normal. Wall thickness wasincreased in a pattern of mild LVH. Systolic function was normal. The estimated ejection fraction was in the range of 55% to 60%.Wall motion was normal; there were no regional wall motionabnormalities. Left ventricular diastolic function parameterswere normal.  Sleep Study 2015: OSA on CPAP.  Recent labs:   CBC Latest Ref Rng & Units 03/04/2018  WBC 4.0 - 10.5 K/uL 6.8  Hemoglobin 12.0 - 15.0 g/dL 12.6  Hematocrit 36.0 - 46.0 % 42.3  Platelets 150 - 400 K/uL 345   CMP Latest Ref Rng & Units 03/04/2018  Glucose 70 - 99 mg/dL 80  BUN 6 - 20 mg/dL 13  Creatinine  0.44 - 1.00 mg/dL 0.82  Sodium 135 - 145 mmol/L 138  Potassium 3.5 - 5.1 mmol/L 3.8  Chloride 98 - 111 mmol/L 104  CO2 22 - 32 mmol/L 25  Calcium 8.9 - 10.3 mg/dL 9.0  Total Protein 6.5 - 8.1 g/dL 7.4  Total Bilirubin 0.3 - 1.2 mg/dL 1.0  Alkaline Phos 38 - 126 U/L 54  AST 15 -  41 U/L 18  ALT 0 - 44 U/L 17    Review of Systems  Constitution: Negative for decreased appetite, malaise/fatigue, weight gain and weight loss.  HENT: Negative for congestion.   Eyes: Negative for visual disturbance.  Cardiovascular: Negative for chest pain, dyspnea on exertion, leg swelling, palpitations and syncope.  Respiratory: Negative for shortness of breath.   Endocrine: Negative for cold intolerance.  Hematologic/Lymphatic: Does not bruise/bleed easily.  Skin: Negative for itching and rash.  Musculoskeletal: Negative for myalgias.  Gastrointestinal: Negative for abdominal pain, nausea and vomiting.  Genitourinary: Negative for dysuria.  Neurological: Negative for dizziness and weakness.  Psychiatric/Behavioral: The patient is not nervous/anxious.   All other systems reviewed and are negative.       Vitals:   05/19/18 1501  BP: 119/90  Pulse: 88   (Measured by the patient using a home BP monitor)   Observation/findings during video visit   Objective:    Physical Exam  Constitutional: She is oriented to person, place, and time. She appears well-developed and well-nourished. No distress.  Pulmonary/Chest: Effort normal.  Neurological: She is alert and oriented to person, place, and time.  Psychiatric: She has a normal mood and affect.  Nursing note and vitals reviewed.         Assessment & Recommendations:   38 y.o. African American female with obesity, atypical chest pain, hypertension  Hypertension: Better controlled. She is cutting hydralazine 50 mg in half due to cough. I do not think cough is related to hydralazine. Nonetheless, I have given her prescription for  hydralazine 25 mg tid. Hydralazine is relatively safer, even if patient does get pregnant.   Chest pain: Still persists. Pain is atypical, and only at rest. Will obtain exercise treadmill stress test in June 2020  Follow up in July 2020   Nigel Mormon, MD [facility] Pager: [phone] Office: [phone] If no answer Cell [phone]

## 2018-07-16 ENCOUNTER — Other Ambulatory Visit: Payer: Self-pay

## 2018-07-16 DIAGNOSIS — I10 Essential (primary) hypertension: Secondary | ICD-10-CM

## 2018-07-16 MED ORDER — HYDRALAZINE HCL 25 MG PO TABS: 25.0000 mg | ORAL_TABLET | Freq: Two times a day (BID) | ORAL | 3 refills | Status: DC

## 2018-08-13 ENCOUNTER — Ambulatory Visit: Payer: 59 | Admitting: Cardiology

## 2018-08-13 ENCOUNTER — Telehealth: Payer: Self-pay

## 2018-09-28 ENCOUNTER — Other Ambulatory Visit: Payer: Self-pay | Admitting: Obstetrics & Gynecology

## 2018-11-05 DIAGNOSIS — R7303 Prediabetes: Secondary | ICD-10-CM

## 2018-11-05 HISTORY — DX: Prediabetes: R73.03

## 2018-11-13 ENCOUNTER — Other Ambulatory Visit: Payer: Self-pay | Admitting: Obstetrics & Gynecology

## 2018-11-20 NOTE — Progress Notes (Signed)
Called and spoke w/ pt via phone to make covid test appointment for 11-23-2018.  Noted in epic pt BMI 53.85.  Asked pt her height and weight, stated height 5' 2.5" @ 305 lb, which makes BMI 54.9.  Pt verbalized understanding that per anesthesia guidelines for ambulatory surgery center due to her BMI over 50 she will need to be done at a main OR in order for anesthesia to provide to best care of her.  Advised the I would called and leave voice message for OR scheduler for Dr Gilford Rile, today and if she has not heard from [place] by noon on Monday 11-23-2018 to call her.  Pt stated she understood.

## 2018-11-24 ENCOUNTER — Other Ambulatory Visit (HOSPITAL_COMMUNITY)
Admission: RE | Admit: 2018-11-24 | Discharge: 2018-11-24 | Disposition: A | Discharge status: Home / Self Care | Payer: 59 | Source: Ambulatory Visit | Attending: Obstetrics & Gynecology | Admitting: Obstetrics & Gynecology

## 2018-11-24 DIAGNOSIS — Z01812 Encounter for preprocedural laboratory examination: Secondary | ICD-10-CM | POA: Insufficient documentation

## 2018-11-24 DIAGNOSIS — Z20828 Contact with and (suspected) exposure to other viral communicable diseases: Secondary | ICD-10-CM | POA: Insufficient documentation

## 2018-11-24 LAB — SARS CORONAVIRUS 2 (TAT 6-24 HRS): SARS Coronavirus 2: NEGATIVE

## 2018-11-25 ENCOUNTER — Other Ambulatory Visit: Payer: Self-pay

## 2018-11-25 ENCOUNTER — Encounter (HOSPITAL_COMMUNITY): Payer: Self-pay | Admitting: *Deleted

## 2018-11-25 NOTE — Anesthesia Preprocedure Evaluation (Addendum)
Anesthesia Evaluation  Patient identified by MRN, date of birth, ID band Patient awake    Reviewed: Allergy & Precautions, H&P , NPO status , Patient's Chart, lab work & pertinent test results, reviewed documented beta blocker date and time   Airway Mallampati: II  TM Distance: >3 FB Neck ROM: full    Dental no notable dental hx.    Pulmonary sleep apnea and Continuous Positive Airway Pressure Ventilation ,    Pulmonary exam normal breath sounds clear to auscultation       Cardiovascular Exercise Tolerance: Good hypertension, Pt. on medications negative cardio ROS   Rhythm:regular Rate:Normal     Neuro/Psych  Headaches, PSYCHIATRIC DISORDERS Anxiety Depression    GI/Hepatic negative GI ROS, Neg liver ROS,   Endo/Other  diabetesMorbid obesity  Renal/GU negative Renal ROS  negative genitourinary   Musculoskeletal   Abdominal   Peds  Hematology  (+) Blood dyscrasia, anemia ,   Anesthesia Other Findings   Reproductive/Obstetrics negative OB ROS                            Anesthesia Physical Anesthesia Plan  ASA: III  Anesthesia Plan: General   Post-op Pain Management:    Induction: Intravenous  PONV Risk Score and Plan:   Airway Management Planned: Oral ETT  Additional Equipment:   Intra-op Plan:   Post-operative Plan: Extubation in OR  Informed Consent: I have reviewed the patients History and Physical, chart, labs and discussed the procedure including the risks, benefits and alternatives for the proposed anesthesia with the patient or authorized representative who has indicated his/her understanding and acceptance.     Dental Advisory Given  Plan Discussed with: CRNA  Anesthesia Plan Comments: (PAT note written 11/25/2018 by Myra Gianotti, PA-C. SAME DAY WORK-UP   )       Anesthesia Quick Evaluation

## 2018-11-25 NOTE — Progress Notes (Signed)
Patient denies shortness of breath, fever, cough and chest pain.  PCP -  Dr Simona Huh Cardiologist - Denies  Chest x-ray - 03/04/18 EPIC, 10/16/18 [facility] EKG - 04/15/18 Epic, 10/16/18 [facility] Stress Test - Denies ECHO - 06/30/13 Cardiac Cath - Denies  Sleep Study - Yes CPAP - Does not use CPAP  Anesthesia review: Yes  STOP now taking any Aspirin (unless otherwise instructed by your surgeon), Aleve, Naproxen, Ibuprofen, Motrin, Advil, Goody's, BC's, all herbal medications, fish oil, and all vitamins.   Coronavirus Screening Covid test on 11/24/18 was negative.  Patient verbalized understanding of instructions that were given via phone.

## 2018-11-25 NOTE — Progress Notes (Signed)
Anesthesia Chart Review: SAME DAY WORK-UP   Case: [id number] Date/Time: 11/26/18 1300   Procedures:      LAPAROSCOPY OPERATIVE (N/A )     DILATATION & CURETTAGE/HYSTEROSCOPY WITH RESECTOCOPE (N/A )     CHROMOPERTUBATION (N/A )   Anesthesia type: General   Pre-op diagnosis: Cervical Stenosis, Menorrhagia with Abnormal Uterine Bleeding, Female Infertility, Polycystic Ovarian Syndrome   Location: [facility] OR ROOM 07 / [place] [facility]   Surgeon: Sanjuana Kava, MD      DISCUSSION: Patient is a 38 year old female scheduled for the above procedure. She was moved from the [facility] to the [facility] Main OR due to BMI > 50.   History includes never smoker, asthma, OSA (does not use CPAP,), hypertension, hyperlipidemia, PCOS (on metformin), prediabetes, GERD, anemia, cluster headaches, morbid obesity.   She was seen by cardiologist Dr. Virgina Jock in March 2020 for atypical chest pain (when bending over and at rest, but not with exertion). ED visit 02/2018 with negative troponin and D-dimer. BP 147/97, so he started hydralazine, but did not recommend ischemic work-up. Normal echo in 2015. She had virtual visit follow-up on 05/21/18. Home BP 119/90. She reported still with intermittent atypical chest pain (only at rest), so he discussed getting a ETT in the future (not scheduled at that time due to the COVID-19 pandemic).   I called and spoke with patient. She is off hydralazine due to side effects, but is doing BP home monitoring and has also gotten established with a PCP. Says home BP readings are ~ 120's/70-80's. Says that her recent A1c was in the "pre-diabetic" range. She said that since she saw Dr. Virgina Jock, she resumed her acid reflux medication and her chest pains have resolved. She walks around her block twice a couple of times a week (takes ~ 20-30 minutes) and will also do a stationary bike for 15 minutes and does not cause chest pain. No SOB or significant DOE. She has used her MDI about once in the past month.   Patient  with history of atypical, non-exertional chest pain, that resolved after resuming her PPI. She walks for 20-30 minutes a few times per week. Reportedly home BP readings have been very reasonable. I have requested her last office note and labs from [facility] since she thinks labs were done within the last two weeks. If not received or > 47 days old, then she will get labs on arrival. Discussed available information with anesthesiologist Nolon Nations, MD. If no acute changes then it is anticipated that she can proceed as planned. 11/24/18 COVID-19 test negative.   VS: Ht 5' 2.5" (1.588 m)   Wt (!) 138.3 kg   BMI 54.90 kg/m   PROVIDERS: Simona Huh, NP is PCP [facility]) Vernell Leep, MD is cardiologist. Last visit 05/21/18.   LABS: As of 10/16/18, labs at [facility] showed H/H 12.9/41.1, PLT 338, normal CMET except glucose 152. Reported labs about two weeks ago at [facility].    IMAGES: PCXR 10/16/18 ([facility]): FINDINGS: Normal cardiac and mediastinal contours. No pleural effusion or pneumothorax. The lungs are clear. IMPRESSION: No acute pulmonary abnormality.   EKG: - EKG 10/16/18 [facility]): Normal sinus rhythm normal axis, normal pr interval Low voltage QRS Borderline ECG No previous ECGs available Lucianne Muss ([id number]) on [id number] PM certifies that he/she has reviewed the ECG tracing and confirms the independent interpretation is correct.  - EKG 03/04/18:  Normal sinus rhythm Normal ECG No significant change  since last tracing Confirmed by Blanchie Dessert [phone]) on 03/04/2018 6:10:41 PM   CV: Echo 06/30/13: Study Conclusions  - Left ventricle: The cavity size was normal. Wall thickness was  increased in a pattern of mild LVH. Systolic function was normal.  The estimated ejection fraction was in the range of 55% to 60%.  Wall motion was normal; there were no regional  wall motion  abnormalities. Left ventricular diastolic function parameters  were normal.    Past Medical History:  Diagnosis Date  . Allergy   . Anemia   . Anxiety   . Asthma   . Back pain    resolved, no current problems per patient 11/25/18  . Chest pain    no current problems per pt 11/25/18  . Depression   . Essential hypertension 04/15/2018  . GERD (gastroesophageal reflux disease)   . Headaches, cluster    resolved per patient 11/25/18, no longer a problem  . Hyperlipidemia   . Obesity, morbid ([place])   . OSA (obstructive sleep apnea) 01/2014   Does not use CPAP  . PCOS (polycystic ovarian syndrome)   . Pre-diabetes 11/2018   diet controlled/exercise, no meds, does not check sugar    Past Surgical History:  Procedure Laterality Date  . APPENDECTOMY  2004  . CHOLECYSTECTOMY  2010  . COLONOSCOPY     polyps  . TONSILLECTOMY AND ADENOIDECTOMY  1985  . UPPER GI ENDOSCOPY    . WISDOM TOOTH EXTRACTION      MEDICATIONS: No current facility-administered medications for this encounter.    . Cyanocobalamin (B-12 COMPLIANCE INJECTION IJ)  . pantoprazole (PROTONIX) 40 MG tablet  . acetaminophen (TYLENOL) 500 MG tablet  . albuterol (PROVENTIL HFA;VENTOLIN HFA) 108 (90 Base) MCG/ACT inhaler  . Ascorbic Acid (VITAMIN C PO)  . fluticasone (FLONASE) 50 MCG/ACT nasal spray  . hydrALAZINE (APRESOLINE) 25 MG tablet  . metFORMIN (GLUCOPHAGE) 500 MG tablet  . methocarbamol (ROBAXIN) 500 MG tablet  . ondansetron (ZOFRAN ODT) 4 MG disintegrating tablet  . Prenatal Vit-Iron Carbonyl-FA (PNV TABS 29-1) 29-1 MG TABS    Myra Gianotti, PA-C Surgical Short Stay/Anesthesiology [facility] Phone [phone] [facility] Phone [phone] 11/25/2018 3:11 PM

## 2018-11-26 ENCOUNTER — Ambulatory Visit (HOSPITAL_COMMUNITY): Payer: No Typology Code available for payment source | Admitting: Vascular Surgery

## 2018-11-26 ENCOUNTER — Other Ambulatory Visit: Payer: Self-pay

## 2018-11-26 ENCOUNTER — Ambulatory Visit (HOSPITAL_COMMUNITY)
Admission: RE | Admit: 2018-11-26 | Discharge: 2018-11-26 | Disposition: A | Discharge status: Home / Self Care | Payer: No Typology Code available for payment source | Attending: Obstetrics & Gynecology | Admitting: Obstetrics & Gynecology

## 2018-11-26 ENCOUNTER — Encounter (HOSPITAL_COMMUNITY)
Admission: RE | Disposition: A | Discharge status: Home / Self Care | Payer: Self-pay | Source: Home / Self Care | Attending: Obstetrics & Gynecology

## 2018-11-26 ENCOUNTER — Encounter (HOSPITAL_COMMUNITY): Payer: Self-pay

## 2018-11-26 DIAGNOSIS — N840 Polyp of corpus uteri: Secondary | ICD-10-CM | POA: Insufficient documentation

## 2018-11-26 DIAGNOSIS — N920 Excessive and frequent menstruation with regular cycle: Secondary | ICD-10-CM | POA: Insufficient documentation

## 2018-11-26 DIAGNOSIS — X58XXXA Exposure to other specified factors, initial encounter: Secondary | ICD-10-CM | POA: Diagnosis not present

## 2018-11-26 DIAGNOSIS — K219 Gastro-esophageal reflux disease without esophagitis: Secondary | ICD-10-CM | POA: Diagnosis not present

## 2018-11-26 DIAGNOSIS — Z91018 Allergy to other foods: Secondary | ICD-10-CM | POA: Diagnosis not present

## 2018-11-26 DIAGNOSIS — Z9049 Acquired absence of other specified parts of digestive tract: Secondary | ICD-10-CM | POA: Diagnosis not present

## 2018-11-26 DIAGNOSIS — Z818 Family history of other mental and behavioral disorders: Secondary | ICD-10-CM | POA: Insufficient documentation

## 2018-11-26 DIAGNOSIS — N736 Female pelvic peritoneal adhesions (postinfective): Secondary | ICD-10-CM | POA: Diagnosis not present

## 2018-11-26 DIAGNOSIS — S3681XA Injury of peritoneum, initial encounter: Secondary | ICD-10-CM | POA: Insufficient documentation

## 2018-11-26 DIAGNOSIS — F329 Major depressive disorder, single episode, unspecified: Secondary | ICD-10-CM | POA: Diagnosis not present

## 2018-11-26 DIAGNOSIS — R7303 Prediabetes: Secondary | ICD-10-CM | POA: Insufficient documentation

## 2018-11-26 DIAGNOSIS — Z8262 Family history of osteoporosis: Secondary | ICD-10-CM | POA: Insufficient documentation

## 2018-11-26 DIAGNOSIS — N971 Female infertility of tubal origin: Secondary | ICD-10-CM | POA: Diagnosis not present

## 2018-11-26 DIAGNOSIS — D649 Anemia, unspecified: Secondary | ICD-10-CM | POA: Diagnosis not present

## 2018-11-26 DIAGNOSIS — Z886 Allergy status to analgesic agent status: Secondary | ICD-10-CM | POA: Insufficient documentation

## 2018-11-26 DIAGNOSIS — J45909 Unspecified asthma, uncomplicated: Secondary | ICD-10-CM | POA: Diagnosis not present

## 2018-11-26 DIAGNOSIS — Z833 Family history of diabetes mellitus: Secondary | ICD-10-CM | POA: Insufficient documentation

## 2018-11-26 DIAGNOSIS — E785 Hyperlipidemia, unspecified: Secondary | ICD-10-CM | POA: Insufficient documentation

## 2018-11-26 DIAGNOSIS — Z888 Allergy status to other drugs, medicaments and biological substances status: Secondary | ICD-10-CM | POA: Insufficient documentation

## 2018-11-26 DIAGNOSIS — E739 Lactose intolerance, unspecified: Secondary | ICD-10-CM | POA: Insufficient documentation

## 2018-11-26 DIAGNOSIS — N882 Stricture and stenosis of cervix uteri: Secondary | ICD-10-CM

## 2018-11-26 DIAGNOSIS — Z79899 Other long term (current) drug therapy: Secondary | ICD-10-CM | POA: Diagnosis not present

## 2018-11-26 DIAGNOSIS — Z6841 Body Mass Index (BMI) 40.0 and over, adult: Secondary | ICD-10-CM | POA: Insufficient documentation

## 2018-11-26 DIAGNOSIS — Z808 Family history of malignant neoplasm of other organs or systems: Secondary | ICD-10-CM | POA: Insufficient documentation

## 2018-11-26 DIAGNOSIS — N979 Female infertility, unspecified: Secondary | ICD-10-CM

## 2018-11-26 DIAGNOSIS — Z83511 Family history of glaucoma: Secondary | ICD-10-CM | POA: Insufficient documentation

## 2018-11-26 DIAGNOSIS — F419 Anxiety disorder, unspecified: Secondary | ICD-10-CM | POA: Diagnosis not present

## 2018-11-26 DIAGNOSIS — Z8249 Family history of ischemic heart disease and other diseases of the circulatory system: Secondary | ICD-10-CM | POA: Diagnosis not present

## 2018-11-26 DIAGNOSIS — I10 Essential (primary) hypertension: Secondary | ICD-10-CM | POA: Diagnosis not present

## 2018-11-26 DIAGNOSIS — Z7984 Long term (current) use of oral hypoglycemic drugs: Secondary | ICD-10-CM | POA: Insufficient documentation

## 2018-11-26 DIAGNOSIS — Z825 Family history of asthma and other chronic lower respiratory diseases: Secondary | ICD-10-CM | POA: Insufficient documentation

## 2018-11-26 DIAGNOSIS — G4733 Obstructive sleep apnea (adult) (pediatric): Secondary | ICD-10-CM | POA: Insufficient documentation

## 2018-11-26 DIAGNOSIS — Z8041 Family history of malignant neoplasm of ovary: Secondary | ICD-10-CM | POA: Insufficient documentation

## 2018-11-26 HISTORY — DX: Polycystic ovarian syndrome: E28.2

## 2018-11-26 HISTORY — DX: Anemia, unspecified: D64.9

## 2018-11-26 HISTORY — DX: Gastro-esophageal reflux disease without esophagitis: K21.9

## 2018-11-26 HISTORY — PX: DILATATION & CURRETTAGE/HYSTEROSCOPY WITH RESECTOCOPE: SHX5572

## 2018-11-26 HISTORY — PX: CHROMOPERTUBATION: SHX6288

## 2018-11-26 HISTORY — DX: Anxiety disorder, unspecified: F41.9

## 2018-11-26 HISTORY — PX: LAPAROSCOPY: SHX197

## 2018-11-26 LAB — BASIC METABOLIC PANEL
Anion gap: 9 (ref 5–15)
BUN: 9 mg/dL (ref 6–20)
CO2: 21 mmol/L — ABNORMAL LOW (ref 22–32)
Calcium: 8.8 mg/dL — ABNORMAL LOW (ref 8.9–10.3)
Chloride: 107 mmol/L (ref 98–111)
Creatinine, Ser: 0.84 mg/dL (ref 0.44–1.00)
GFR calc Af Amer: >60 mL/min (ref >60)
GFR calc non Af Amer: >60 mL/min (ref >60)
Glucose, Bld: 81 mg/dL (ref 70–99)
Potassium: 4.6 mmol/L (ref 3.5–5.1)
Sodium: 137 mmol/L (ref 135–145)

## 2018-11-26 LAB — CBC
HCT: 39.1 % (ref 36.0–46.0)
Hemoglobin: 12.3 g/dL (ref 12.0–15.0)
MCH: 25.9 pg — ABNORMAL LOW (ref 26.0–34.0)
MCHC: 31.5 g/dL (ref 30.0–36.0)
MCV: 82.3 fL (ref 80.0–100.0)
Platelets: 342 10*3/uL (ref 150–400)
RBC: 4.75 MIL/uL (ref 3.87–5.11)
RDW: 14 % (ref 11.5–15.5)
WBC: 7.8 10*3/uL (ref 4.0–10.5)
nRBC: 0 % (ref 0.0–0.2)

## 2018-11-26 LAB — POCT PREGNANCY, URINE: Preg Test, Ur: NEGATIVE

## 2018-11-26 LAB — GLUCOSE, CAPILLARY: Glucose-Capillary: 91 mg/dL (ref 70–99)

## 2018-11-26 SURGERY — LAPAROSCOPY OPERATIVE
Anesthesia: General | Site: Vagina

## 2018-11-26 MED ORDER — FENTANYL CITRATE (PF) 250 MCG/5ML IJ SOLN
INTRAMUSCULAR | Status: DC | PRN
  Administered 2018-11-26 (×4): 50 ug via INTRAVENOUS

## 2018-11-26 MED ORDER — LACTATED RINGERS IV SOLN
INTRAVENOUS | Status: DC
  Administered 2018-11-26: 12:00:00 via INTRAVENOUS

## 2018-11-26 MED ORDER — ACETAMINOPHEN 325 MG PO TABS: 325.0000 mg | ORAL_TABLET | ORAL | Status: DC | PRN

## 2018-11-26 MED ORDER — KETOROLAC TROMETHAMINE 30 MG/ML IJ SOLN
INTRAMUSCULAR | Status: AC
  Filled 2018-11-26: qty 1

## 2018-11-26 MED ORDER — ONDANSETRON HCL 4 MG/2ML IJ SOLN: 4.0000 mg | Freq: Once | INTRAMUSCULAR | Status: DC | PRN

## 2018-11-26 MED ORDER — BUPIVACAINE HCL (PF) 0.25 % IJ SOLN
INTRAMUSCULAR | Status: AC
  Filled 2018-11-26: qty 30

## 2018-11-26 MED ORDER — LIDOCAINE HCL 1 % IJ SOLN
INTRAMUSCULAR | Status: DC | PRN
  Administered 2018-11-26: 10 mL

## 2018-11-26 MED ORDER — LIDOCAINE 2% (20 MG/ML) 5 ML SYRINGE
INTRAMUSCULAR | Status: AC
  Filled 2018-11-26: qty 5

## 2018-11-26 MED ORDER — BUPIVACAINE HCL 0.25 % IJ SOLN
INTRAMUSCULAR | Status: DC | PRN
  Administered 2018-11-26: 10 mL

## 2018-11-26 MED ORDER — LIDOCAINE 2% (20 MG/ML) 5 ML SYRINGE
INTRAMUSCULAR | Status: DC | PRN
  Administered 2018-11-26: 100 mg via INTRAVENOUS

## 2018-11-26 MED ORDER — ACETAMINOPHEN 160 MG/5ML PO SOLN: 325.0000 mg | ORAL | Status: DC | PRN

## 2018-11-26 MED ORDER — MIDAZOLAM HCL 5 MG/5ML IJ SOLN
INTRAMUSCULAR | Status: DC | PRN
  Administered 2018-11-26: 2 mg via INTRAVENOUS

## 2018-11-26 MED ORDER — PHENYLEPHRINE 40 MCG/ML (10ML) SYRINGE FOR IV PUSH (FOR BLOOD PRESSURE SUPPORT)
PREFILLED_SYRINGE | INTRAVENOUS | Status: DC | PRN
  Administered 2018-11-26: 80 ug via INTRAVENOUS

## 2018-11-26 MED ORDER — ONDANSETRON HCL 4 MG/2ML IJ SOLN
INTRAMUSCULAR | Status: AC
  Filled 2018-11-26: qty 2

## 2018-11-26 MED ORDER — ROCURONIUM BROMIDE 10 MG/ML (PF) SYRINGE
PREFILLED_SYRINGE | INTRAVENOUS | Status: DC | PRN
  Administered 2018-11-26: 50 mg via INTRAVENOUS
  Administered 2018-11-26: 20 mg via INTRAVENOUS

## 2018-11-26 MED ORDER — FENTANYL CITRATE (PF) 100 MCG/2ML IJ SOLN
25.0000 ug | INTRAMUSCULAR | Status: DC | PRN
  Administered 2018-11-26 (×3): 50 ug via INTRAVENOUS

## 2018-11-26 MED ORDER — MIDAZOLAM HCL 2 MG/2ML IJ SOLN
INTRAMUSCULAR | Status: AC
  Filled 2018-11-26: qty 2

## 2018-11-26 MED ORDER — FENTANYL CITRATE (PF) 100 MCG/2ML IJ SOLN
INTRAMUSCULAR | Status: AC
  Filled 2018-11-26: qty 2

## 2018-11-26 MED ORDER — ACETAMINOPHEN 10 MG/ML IV SOLN
INTRAVENOUS | Status: DC | PRN
  Administered 2018-11-26: 1000 mg via INTRAVENOUS

## 2018-11-26 MED ORDER — HYDROCODONE-ACETAMINOPHEN 5-325 MG PO TABS: ORAL_TABLET | ORAL | 0 refills | Status: DC

## 2018-11-26 MED ORDER — OXYCODONE HCL 5 MG PO TABS
5.0000 mg | ORAL_TABLET | Freq: Once | ORAL | Status: AC | PRN
  Administered 2018-11-26: 5 mg via ORAL

## 2018-11-26 MED ORDER — SODIUM CHLORIDE 0.9 % IR SOLN
Status: DC | PRN
  Administered 2018-11-26: 3000 mL

## 2018-11-26 MED ORDER — MEPERIDINE HCL 25 MG/ML IJ SOLN: 6.2500 mg | INTRAMUSCULAR | Status: DC | PRN

## 2018-11-26 MED ORDER — PROPOFOL 10 MG/ML IV BOLUS
INTRAVENOUS | Status: DC | PRN
  Administered 2018-11-26: 200 mg via INTRAVENOUS

## 2018-11-26 MED ORDER — DEXAMETHASONE SODIUM PHOSPHATE 10 MG/ML IJ SOLN
INTRAMUSCULAR | Status: AC
  Filled 2018-11-26: qty 1

## 2018-11-26 MED ORDER — FENTANYL CITRATE (PF) 250 MCG/5ML IJ SOLN
INTRAMUSCULAR | Status: AC
  Filled 2018-11-26: qty 5

## 2018-11-26 MED ORDER — DEXAMETHASONE SODIUM PHOSPHATE 10 MG/ML IJ SOLN
INTRAMUSCULAR | Status: DC | PRN
  Administered 2018-11-26: 5 mg via INTRAVENOUS

## 2018-11-26 MED ORDER — METHYLENE BLUE 0.5 % INJ SOLN
INTRAVENOUS | Status: DC | PRN
  Administered 2018-11-26: 5 mL

## 2018-11-26 MED ORDER — ONDANSETRON HCL 4 MG/2ML IJ SOLN
INTRAMUSCULAR | Status: DC | PRN
  Administered 2018-11-26: 4 mg via INTRAVENOUS

## 2018-11-26 MED ORDER — SUGAMMADEX SODIUM 200 MG/2ML IV SOLN
INTRAVENOUS | Status: DC | PRN
  Administered 2018-11-26: 275 mg via INTRAVENOUS

## 2018-11-26 MED ORDER — METHYLENE BLUE 0.5 % INJ SOLN
INTRAVENOUS | Status: AC
  Filled 2018-11-26: qty 10

## 2018-11-26 MED ORDER — PROPOFOL 10 MG/ML IV BOLUS
INTRAVENOUS | Status: AC
  Filled 2018-11-26: qty 20

## 2018-11-26 MED ORDER — ACETAMINOPHEN 10 MG/ML IV SOLN
INTRAVENOUS | Status: AC
  Filled 2018-11-26: qty 100

## 2018-11-26 MED ORDER — OXYCODONE HCL 5 MG PO TABS
ORAL_TABLET | ORAL | Status: AC
  Filled 2018-11-26: qty 1

## 2018-11-26 MED ORDER — OXYCODONE HCL 5 MG/5ML PO SOLN: 5.0000 mg | Freq: Once | ORAL | Status: AC | PRN

## 2018-11-26 MED ORDER — LIDOCAINE HCL 1 % IJ SOLN
INTRAMUSCULAR | Status: AC
  Filled 2018-11-26: qty 20

## 2018-11-26 SURGICAL SUPPLY — 57 items
ADH SKN CLS APL DERMABOND .7 (GAUZE/BANDAGES/DRESSINGS) ×2
APL SRG 38 LTWT LNG FL B (MISCELLANEOUS)
APPLICATOR ARISTA FLEXITIP XL (MISCELLANEOUS) IMPLANT
BAG SPEC RTRVL LRG 6X4 10 (ENDOMECHANICALS)
BIPOLAR CUTTING LOOP 21FR (ELECTRODE)
CABLE HIGH FREQUENCY MONO STRZ (ELECTRODE) IMPLANT
CATH ROBINSON RED A/P 16FR (CATHETERS) ×2 IMPLANT
DEFOGGER SCOPE WARMER CLEARIFY (MISCELLANEOUS) ×2 IMPLANT
DERMABOND ADVANCED (GAUZE/BANDAGES/DRESSINGS) ×1
DERMABOND ADVANCED .7 DNX12 (GAUZE/BANDAGES/DRESSINGS) ×2 IMPLANT
DISSECTOR BLUNT TIP ENDO 5MM (MISCELLANEOUS) IMPLANT
DRSG OPSITE POSTOP 3X4 (GAUZE/BANDAGES/DRESSINGS) ×1 IMPLANT
DURAPREP 26ML APPLICATOR (WOUND CARE) ×3 IMPLANT
ELECT REM PT RETURN 9FT ADLT (ELECTROSURGICAL)
ELECTRODE REM PT RTRN 9FT ADLT (ELECTROSURGICAL) IMPLANT
GLOVE BIO SURGEON STRL SZ 6.5 (GLOVE) ×3 IMPLANT
GLOVE BIOGEL PI IND STRL 7.0 (GLOVE) ×8 IMPLANT
GLOVE BIOGEL PI INDICATOR 7.0 (GLOVE) ×4
GOWN STRL REUS W/ TWL LRG LVL3 (GOWN DISPOSABLE) ×4 IMPLANT
GOWN STRL REUS W/TWL LRG LVL3 (GOWN DISPOSABLE) ×6
HEMOSTAT ARISTA ABSORB 3G PWDR (HEMOSTASIS) IMPLANT
IV STOPCOCK 4 WAY 40  W/Y SET (IV SOLUTION) ×1
IV STOPCOCK 4 WAY 40 W/Y SET (IV SOLUTION) IMPLANT
KIT PROCEDURE FLUENT (KITS) ×3 IMPLANT
KIT TURNOVER KIT B (KITS) ×3 IMPLANT
LIGASURE VESSEL 5MM BLUNT TIP (ELECTROSURGICAL) IMPLANT
LOOP CUTTING BIPOLAR 21FR (ELECTRODE) IMPLANT
MANIPULATOR UTERINE 4.5 ZUMI (MISCELLANEOUS) ×1 IMPLANT
NS IRRIG 1000ML POUR BTL (IV SOLUTION) ×3 IMPLANT
PACK LAPAROSCOPY BASIN (CUSTOM PROCEDURE TRAY) ×3 IMPLANT
PACK TRENDGUARD 450 HYBRID PRO (MISCELLANEOUS) IMPLANT
PACK VAGINAL MINOR WOMEN LF (CUSTOM PROCEDURE TRAY) ×3 IMPLANT
PAD OB MATERNITY 4.3X12.25 (PERSONAL CARE ITEMS) ×3 IMPLANT
POUCH LAPAROSCOPIC INSTRUMENT (MISCELLANEOUS) ×2 IMPLANT
POUCH SPECIMEN RETRIEVAL 10MM (ENDOMECHANICALS) IMPLANT
PROTECTOR NERVE ULNAR (MISCELLANEOUS) ×6 IMPLANT
SCISSORS LAP 5X35 DISP (ENDOMECHANICALS) IMPLANT
SET IRRIG TUBING LAPAROSCOPIC (IRRIGATION / IRRIGATOR) IMPLANT
SET TRI-LUMEN FLTR TB AIRSEAL (TUBING) IMPLANT
SET TUBE SMOKE EVAC HIGH FLOW (TUBING) ×3 IMPLANT
SLEEVE ENDOPATH XCEL 5M (ENDOMECHANICALS) ×3 IMPLANT
SOLUTION ELECTROLUBE (MISCELLANEOUS) IMPLANT
SUT MNCRL AB 4-0 PS2 18 (SUTURE) ×3 IMPLANT
SUT MON AB 4-0 PS1 27 (SUTURE) ×3 IMPLANT
SUT VICRYL 0 UR6 27IN ABS (SUTURE) ×3 IMPLANT
SYR 50ML LL SCALE MARK (SYRINGE) ×3 IMPLANT
SYR 5ML LL (SYRINGE) ×1 IMPLANT
TOWEL GREEN STERILE FF (TOWEL DISPOSABLE) ×6 IMPLANT
TRAP SPECIMEN MUCOUS 40CC (MISCELLANEOUS) ×1 IMPLANT
TRAY FOLEY W/BAG SLVR 14FR (SET/KITS/TRAYS/PACK) ×3 IMPLANT
TRENDGUARD 450 HYBRID PRO PACK (MISCELLANEOUS) ×3
TROCAR 12M 150ML BLUNT (TROCAR) ×1 IMPLANT
TROCAR 5M 150ML BLDLS (TROCAR) ×1 IMPLANT
TROCAR PORT AIRSEAL 5X120 (TROCAR) IMPLANT
TROCAR XCEL NON-BLD 11X100MML (ENDOMECHANICALS) IMPLANT
TROCAR XCEL NON-BLD 5MMX100MML (ENDOMECHANICALS) ×2 IMPLANT
WARMER LAPAROSCOPE (MISCELLANEOUS) ×3 IMPLANT

## 2018-11-26 NOTE — H&P (Signed)
Diane Patel is an 38 y.o. female with long history of infertility. One h/o early missed ab when she was young HSG unable to be performed due to cervical stenosis.  No h/o ckc or LEEP.  She has no complaints today.   No LMP recorded. (Menstrual status: Irregular Periods).    Past Medical History:  Diagnosis Date  . Allergy   . Anemia   . Anxiety   . Asthma   . Back pain    resolved, no current problems per patient 11/25/18  . Chest pain    no current problems per pt 11/25/18  . Depression   . Essential hypertension 04/15/2018  . GERD (gastroesophageal reflux disease)   . Headaches, cluster    resolved per patient 11/25/18, no longer a problem  . Hyperlipidemia   . Obesity, morbid ([place])   . OSA (obstructive sleep apnea) 01/2014   Does not use CPAP  . PCOS (polycystic ovarian syndrome)   . Pre-diabetes 11/2018   diet controlled/exercise, no meds, does not check sugar    Past Surgical History:  Procedure Laterality Date  . APPENDECTOMY  2004  . CHOLECYSTECTOMY  2010  . COLONOSCOPY     polyps  . TONSILLECTOMY AND ADENOIDECTOMY  1985  . UPPER GI ENDOSCOPY    . WISDOM TOOTH EXTRACTION      Family History  Problem Relation Age of Onset  . Cancer Maternal Grandmother        ? stomach cancer  . Diabetes Maternal Grandmother   . Hypertension Mother   . Diabetes Mother   . Heart disease Mother   . Polycystic ovary syndrome Mother   . Cancer - Ovarian Mother   . Diabetes Father   . Hypertension Father   . Emphysema Father        smoked  . Diabetes Sister   . Pancreatitis Sister   . Cancer Maternal Grandfather        bone  . Glaucoma Maternal Grandfather   . Osteoporosis Maternal Grandfather   . Hypertension Paternal Grandfather   . Mental illness Paternal Grandfather   . Asthma Brother     Social History:  reports that she has never smoked. She has never used smokeless tobacco. She reports that she does not drink alcohol or use drugs.  Allergies:  Allergies   Allergen Reactions  . Ace Inhibitors Anaphylaxis  . Pineapple Anaphylaxis and Hives  . Flexeril [Cyclobenzaprine] Hives  . Lactose Intolerance (Gi) Diarrhea and Other (See Comments)  . Chocolate Itching    Has been diagnosed by allergist   . Cocoa Itching    Has been diagnosed by allergist   . Naproxen Itching    Medications Prior to Admission  Medication Sig Dispense Refill Last Dose  . acetaminophen (TYLENOL) 500 MG tablet Take 1,000 mg by mouth every 6 (six) hours as needed for headache.   11/26/2018 at 0800  . albuterol (PROVENTIL HFA;VENTOLIN HFA) 108 (90 Base) MCG/ACT inhaler Inhale 1-2 puffs into the lungs every 6 (six) hours as needed for wheezing or shortness of breath. 1 Inhaler 0 Past Month at Unknown time  . Ascorbic Acid (VITAMIN C PO) Take 1 tablet by mouth daily.   Past Month at Unknown time  . Cyanocobalamin (B-12 COMPLIANCE INJECTION IJ) Inject as directed every 30 (thirty) days. Last injection 11/2018   Past Month at Unknown time  . metFORMIN (GLUCOPHAGE) 500 MG tablet Take by mouth 2 (two) times daily with a meal.   Past Week at Unknown  time  . pantoprazole (PROTONIX) 40 MG tablet Take 40 mg by mouth daily.   11/26/2018 at 0800  . Prenatal Vit-Iron Carbonyl-FA (PNV TABS 29-1) 29-1 MG TABS Take 1 tablet by mouth daily.   Past Month at Unknown time  . fluticasone (FLONASE) 50 MCG/ACT nasal spray Place 1 spray into both nostrils daily. (Patient taking differently: Place 1 spray into both nostrils as needed. ) 16 g 2   . hydrALAZINE (APRESOLINE) 25 MG tablet Take 1 tablet (25 mg total) by mouth 2 (two) times daily. 180 tablet 3 More than a month at Unknown time  . methocarbamol (ROBAXIN) 500 MG tablet Take 1 tablet (500 mg total) by mouth every 8 (eight) hours as needed for muscle spasms. 15 tablet 0 More than a month at Unknown time  . ondansetron (ZOFRAN ODT) 4 MG disintegrating tablet Take 1 tablet (4 mg total) by mouth every 8 (eight) hours as needed for nausea or  vomiting. (Patient not taking: Reported on 05/21/2018) 20 tablet 0     ROS  Negative  Blood pressure 133/66, pulse 93, temperature 98.2 F (36.8 C), temperature source Oral, resp. rate 20, height 5' 2.5" (1.588 m), weight (!) 137.9 kg, SpO2 98 %. Physical Exam  Nursing note and vitals reviewed. Constitutional: She appears well-developed.  Genitourinary:    Genitourinary Comments: Deferred to OR     Results for orders placed or performed during the hospital encounter of 11/26/18 (from the past 24 hour(s))  Glucose, capillary     Status: None   Collection Time: 11/26/18 11:11 AM  Result Value Ref Range   Glucose-Capillary 91 70 - 99 mg/dL  CBC     Status: Abnormal   Collection Time: 11/26/18 11:16 AM  Result Value Ref Range   WBC 7.8 4.0 - 10.5 K/uL   RBC 4.75 3.87 - 5.11 MIL/uL   Hemoglobin 12.3 12.0 - 15.0 g/dL   HCT 39.1 36.0 - 46.0 %   MCV 82.3 80.0 - 100.0 fL   MCH 25.9 (L) 26.0 - 34.0 pg   MCHC 31.5 30.0 - 36.0 g/dL   RDW 14.0 11.5 - 15.5 %   Platelets 342 150 - 400 K/uL   nRBC 0.0 0.0 - 0.2 %  Basic metabolic panel     Status: Abnormal   Collection Time: 11/26/18 11:16 AM  Result Value Ref Range   Sodium 137 135 - 145 mmol/L   Potassium 4.6 3.5 - 5.1 mmol/L   Chloride 107 98 - 111 mmol/L   CO2 21 (L) 22 - 32 mmol/L   Glucose, Bld 81 70 - 99 mg/dL   BUN 9 6 - 20 mg/dL   Creatinine, Ser 0.84 0.44 - 1.00 mg/dL   Calcium 8.8 (L) 8.9 - 10.3 mg/dL   GFR calc non Af Amer >60 >60 mL/min   GFR calc Af Amer >60 >60 mL/min   Anion gap 9 5 - 15  Pregnancy, urine POC     Status: None   Collection Time: 11/26/18 11:18 AM  Result Value Ref Range   Preg Test, Ur NEGATIVE NEGATIVE    No results found.  Assessment/Plan: 38 year old with possible cervical stenosis and secondary infertility Will dilate the cervix, discussed adding hysteroscopy to her consent to look in the uterus. Will be performing a diagnostic laparoscopy / chromopertubation. We discussed that if her  fallopian tubes are patent we will initiate ovarian stimulation. Husband still needs to get a semen analysis. If by chance her tubes are not  open , then I will refer them to [facility] for IVF. Patient counseled to lose weight as this will promote increase in fertility and a healthy pregnancy.  Discussed the risks of surgery to include injury to any other organs there may be a need to convert to an open laparotomy procedure, infection or blood loss, injury to bowel, bladder, ureters, nerves arteries or veins. She understands the risks and benefits and all inquiries answered.  Syndi Pua, [place] 11/26/2018, 1:16 PM

## 2018-11-26 NOTE — Discharge Instructions (Signed)
Call [facility] @ [phone] if:  You have a temperature greater than or equal to 100.4 degrees Farenheit orally You have pain that is not made better by the pain medication given and taken as directed You have excessive bleeding or problems urinating  Take Colace (Docusate Sodium/Stool Softener) 100 mg 2-3 times daily while taking narcotic pain medicine to avoid constipation or until bowel movements are regular.  You may drive after 24 hours You may walk up steps  You may shower tomorrow You may resume a regular diet  Keep incisions clean and dry Do not lift over 15 pounds until after your post operative visit Avoid anything in vagina  until after your post-operative visit

## 2018-11-26 NOTE — Transfer of Care (Signed)
Immediate Anesthesia Transfer of Care Note  Patient: Diane Patel  Procedure(s) Performed: LAPAROSCOPY OPERATIVE (N/A Abdomen) DILATATION & CURETTAGE/DIAGNOSTIC HYSTEROSCOPY WITH POLYPECTOMY (N/A Vagina ) CHROMOPERTUBATION (N/A Vagina )  Patient Location: PACU  Anesthesia Type:General  Level of Consciousness: awake, alert  and oriented  Airway & Oxygen Therapy: Patient Spontanous Breathing  Post-op Assessment: Report given to RN, Post -op Vital signs reviewed and stable and Patient moving all extremities  Post vital signs: Reviewed and stable  Last Vitals:  Vitals Value Taken Time  BP 120/86 11/26/18 1620  Temp    Pulse 95 11/26/18 1624  Resp 16 11/26/18 1624  SpO2 100 % 11/26/18 1624  Vitals shown include unvalidated device data.  Last Pain:  Vitals:   11/26/18 1131  TempSrc:   PainSc: 4       Patients Stated Pain Goal: 2 ([id number])  Complications: No apparent anesthesia complications

## 2018-11-26 NOTE — Anesthesia Procedure Notes (Signed)
Procedure Name: Intubation Date/Time: 11/26/2018 2:42 PM Performed by: Amadeo Garnet, CRNA Pre-anesthesia Checklist: Patient identified, Emergency Drugs available, Suction available, Patient being monitored and Timeout performed Patient Re-evaluated:Patient Re-evaluated prior to induction Oxygen Delivery Method: Circle system utilized Preoxygenation: Pre-oxygenation with 100% oxygen Ventilation: Mask ventilation without difficulty Laryngoscope Size: Glidescope and 4 Grade View: Grade I Tube size: 7.0 mm Number of attempts: 2 Airway Equipment and Method: Stylet Placement Confirmation: ETT inserted through vocal cords under direct vision,  positive ETCO2 and breath sounds checked- equal and bilateral Secured at: 22 cm Tube secured with: Tape Dental Injury: Teeth and Oropharynx as per pre-operative assessment  Comments: DL x1 with Mac 3- only epiglottis visualized; DL with glidescope 4- Grade I view, ETT placed atraumatically.

## 2018-11-26 NOTE — Op Note (Signed)
OPERATIVE NOTE  Holden Hueston  DOB:    1980/12/16  MRN:    [id number]  CSN:    [id number]  Date of Surgery:  11/26/2018  Preoperative Diagnosis: 1. Female infertility 2. Uterine cervix stenosis  Postoperative Diagnosis: 1. Female infertility 2. Non-patent fallopian tubes (bilateral) 3. Patent cervix 4. Endometrial polyps  Procedure: 1. Dilatation and Curettage 2. Diagnostic Hysteroscopy with endometrial polypectomy 3. Operative Laparoscopy with chromopertubation  4. Peritoneal washings  Surgeon: Mady Haagensen. Alwyn Pea, M.D.  Assistant: Earnstine Regal, PA  Anesthetic: General  EBL: 5 mL  Fluids: 1067mL LR   UOP: 75 mL   Catheter: Foley during case (discontinued once completed)  Indication:  Mrs. Sibbett is a 38 y.o. year old female,presents for diagnostic hysteroscopy as she has h/o heavy menstrual bleeding. She has h/o female fertility and HSG was attempted as outpatient and per Radiology could not be completed due to cervical stenosis.  Risks, benefits, alternatives and indication was explained to patient prior to the procedure.   Findings:  Exam under anesthesia: Normal uterus, mobile anteverted, no palpable masses Vagina: normal ruggae, cervix grossly normal  Hysteroscopy: polypoid tissue in endometrium, both ostia visualized after curettage Laparoscopy: Free fluid in the anterior and posterior cul de sac. There was a peritoneal tissue versus clot in anterior cul de sac that was removed and sent to pathology. Right pelvic adhesive disease, No evidence of endometriosis, however uterus appears enlarged and globular with visible small fibroids on anterior and posterior surface, normal appearing right tube grossly however no fill and spill of methylene blue dilution. Normal appearing right ovary.  Left fallopian tube more scarred and abnormal in appearance fill until isthmus, no spill at fimbriated end. Normal appearing left ovary with visible follicle. No appendix or gallbladder  due to previous surgery.  Bowel and liver appeared normal   Procedure:  The patient was taken to the operating room where general anesthesia was found to be adequate. She was placed in dorsal lithotomy position and examined under anesthesia was performed with findings noted above. The patient's abdomen, perineum, and vagina were prepped with sterile solution. The bladder was drained of urine with a foley catheter. A sterile operative graves speculum was placed and the anterior lip of the cervix held with a single-toothed tenaculum. The uterus was sounded to 7cm. The cervix was dilated to 14 Fr with Hanks dilators. A 67mm hysteroscope was introduced into the uterine cavity under direct video visualization and the cavity distended with sterile normal saline. A complete survey was performed with findings noted above. The hysteroscope was removed and a sharp curettage was performed with moderate amount of polypoid tissue evacuated and sent off to Pathology.   A Humi uterine manipulator  was then placed inside the uterus. Graves speculum and tenaculum were removed.  Surgeon gloves were changed and attention was turned to the abdomen. The subumbilical area was injected with quarter percent Marcaine. A subumbilical incision was made using an 11 blade scalpel and the peritoneum was entered directly using a 11 mm Excel Visaport with 0 degree 75mm laparoscope attached. Confirmation of entry into the peritoneum was confirmed with opening pressure of CO2 and direct visualization. Pneumoperitoneum was obtained using approximately 2 L of CO2 gas. The pelvic organs were inspected with findings as mentioned above. Marcaine was injected in the left lower quadrant. A 5 mm trocar was placed and the abdominal cavity under direct visualization. There was no noted injury with placement of any of the trocars. Pelvic washings were performed and  sent off to Cytology. The peritoneal tissue versus clot seen in anterior cul de sac was  removed with a endoscopic grasper and brought out through the 30mm trocar.   Each fimbriated end of each fallopian tube were mobilized into view and diluted Methylene blue pushed through the [place].  While watching the fimbriated ends there was no spill of the dye into the peritoneal cavity.  The chromopertubation was repeated three times to ensure there was entry of dye into the uterine cavity and through the ostia and tubes.  There was no fill and spill in all three occasions.   The pneumoperitoneum was allowed to escape. All instruments were removed. The subumbilical fascia was closed using 0 Vicryl on a UR-6 needle. The skin was reapproximated using 3-0 Monocryl. Steri strips, and op sites placed on incisions. All instrumentation was removed from the vagina and the cervix noted to be hemostatic. Sponge, needle, instrument counts were correct. The estimated blood loss was less than 10 cc. The patient tolerated her procedure well. She was awakened from her anesthetic without difficulty. She was transported to the recovery room in stable condition. The peritoneal specimen, and cytology washings as well as endometrial curettings were sent to Pathology. There were no complications  Jamaree Hosier S. Alwyn Pea, M.D.

## 2018-11-27 ENCOUNTER — Encounter (HOSPITAL_COMMUNITY): Payer: Self-pay | Admitting: Obstetrics & Gynecology

## 2018-11-27 LAB — CYTOLOGY - NON PAP

## 2018-11-27 NOTE — Anesthesia Postprocedure Evaluation (Signed)
Anesthesia Post Note  Patient: Diane Patel  Procedure(s) Performed: LAPAROSCOPY OPERATIVE (N/A Abdomen) DILATATION & CURETTAGE/DIAGNOSTIC HYSTEROSCOPY WITH POLYPECTOMY (N/A Vagina ) CHROMOPERTUBATION (N/A Vagina )     Patient location during evaluation: PACU Anesthesia Type: General Level of consciousness: sedated and patient cooperative Pain management: pain level controlled Vital Signs Assessment: post-procedure vital signs reviewed and stable Respiratory status: spontaneous breathing Cardiovascular status: stable Anesthetic complications: no    Last Vitals:  Vitals:   11/26/18 1705 11/26/18 1735  BP: 133/85 112/80  Pulse: 72 78  Resp: 16 20  Temp:    SpO2: 100% 98%    Last Pain:  Vitals:   11/26/18 1650  TempSrc:   PainSc: [place]

## 2018-11-30 LAB — SURGICAL PATHOLOGY

## 2019-01-21 ENCOUNTER — Emergency Department (HOSPITAL_BASED_OUTPATIENT_CLINIC_OR_DEPARTMENT_OTHER): Payer: No Typology Code available for payment source

## 2019-01-21 ENCOUNTER — Emergency Department (HOSPITAL_BASED_OUTPATIENT_CLINIC_OR_DEPARTMENT_OTHER)
Admission: EM | Admit: 2019-01-21 | Discharge: 2019-01-21 | Disposition: A | Discharge status: Home / Self Care | Payer: No Typology Code available for payment source | Attending: Emergency Medicine | Admitting: Emergency Medicine

## 2019-01-21 ENCOUNTER — Other Ambulatory Visit: Payer: No Typology Code available for payment source

## 2019-01-21 ENCOUNTER — Other Ambulatory Visit: Payer: Self-pay

## 2019-01-21 ENCOUNTER — Encounter (HOSPITAL_BASED_OUTPATIENT_CLINIC_OR_DEPARTMENT_OTHER): Payer: Self-pay

## 2019-01-21 ENCOUNTER — Ambulatory Visit: Payer: No Typology Code available for payment source

## 2019-01-21 DIAGNOSIS — E785 Hyperlipidemia, unspecified: Secondary | ICD-10-CM | POA: Insufficient documentation

## 2019-01-21 DIAGNOSIS — R109 Unspecified abdominal pain: Secondary | ICD-10-CM

## 2019-01-21 DIAGNOSIS — Z7984 Long term (current) use of oral hypoglycemic drugs: Secondary | ICD-10-CM | POA: Diagnosis not present

## 2019-01-21 DIAGNOSIS — R1030 Lower abdominal pain, unspecified: Secondary | ICD-10-CM | POA: Insufficient documentation

## 2019-01-21 DIAGNOSIS — R7303 Prediabetes: Secondary | ICD-10-CM | POA: Diagnosis not present

## 2019-01-21 DIAGNOSIS — J45909 Unspecified asthma, uncomplicated: Secondary | ICD-10-CM | POA: Insufficient documentation

## 2019-01-21 DIAGNOSIS — M545 Low back pain, unspecified: Secondary | ICD-10-CM

## 2019-01-21 DIAGNOSIS — Z79899 Other long term (current) drug therapy: Secondary | ICD-10-CM | POA: Insufficient documentation

## 2019-01-21 DIAGNOSIS — Z886 Allergy status to analgesic agent status: Secondary | ICD-10-CM | POA: Insufficient documentation

## 2019-01-21 DIAGNOSIS — Z888 Allergy status to other drugs, medicaments and biological substances status: Secondary | ICD-10-CM | POA: Diagnosis not present

## 2019-01-21 DIAGNOSIS — D219 Benign neoplasm of connective and other soft tissue, unspecified: Secondary | ICD-10-CM

## 2019-01-21 DIAGNOSIS — Z20822 Contact with and (suspected) exposure to covid-19: Secondary | ICD-10-CM

## 2019-01-21 DIAGNOSIS — D259 Leiomyoma of uterus, unspecified: Secondary | ICD-10-CM | POA: Diagnosis not present

## 2019-01-21 DIAGNOSIS — Z91018 Allergy to other foods: Secondary | ICD-10-CM | POA: Insufficient documentation

## 2019-01-21 DIAGNOSIS — I10 Essential (primary) hypertension: Secondary | ICD-10-CM | POA: Diagnosis not present

## 2019-01-21 LAB — CBC WITH DIFFERENTIAL/PLATELET
Abs Immature Granulocytes: 0.01 10*3/uL (ref 0.00–0.07)
Basophils Absolute: 0.1 10*3/uL (ref 0.0–0.1)
Basophils Relative: 1 %
Eosinophils Absolute: 0.1 10*3/uL (ref 0.0–0.5)
Eosinophils Relative: 2 %
HCT: 39.2 % (ref 36.0–46.0)
Hemoglobin: 11.9 g/dL — ABNORMAL LOW (ref 12.0–15.0)
Immature Granulocytes: 0 %
Lymphocytes Relative: 29 %
Lymphs Abs: 2.3 10*3/uL (ref 0.7–4.0)
MCH: 24.5 pg — ABNORMAL LOW (ref 26.0–34.0)
MCHC: 30.4 g/dL (ref 30.0–36.0)
MCV: 80.7 fL (ref 80.0–100.0)
Monocytes Absolute: 0.5 10*3/uL (ref 0.1–1.0)
Monocytes Relative: 6 %
Neutro Abs: 5.1 10*3/uL (ref 1.7–7.7)
Neutrophils Relative %: 62 %
Platelets: 426 10*3/uL — ABNORMAL HIGH (ref 150–400)
RBC: 4.86 MIL/uL (ref 3.87–5.11)
RDW: 13.4 % (ref 11.5–15.5)
WBC: 8.1 10*3/uL (ref 4.0–10.5)
nRBC: 0 % (ref 0.0–0.2)

## 2019-01-21 LAB — COMPREHENSIVE METABOLIC PANEL
ALT: 17 U/L (ref 0–44)
AST: 17 U/L (ref 15–41)
Albumin: 4.1 g/dL (ref 3.5–5.0)
Alkaline Phosphatase: 68 U/L (ref 38–126)
Anion gap: 9 (ref 5–15)
BUN: 8 mg/dL (ref 6–20)
CO2: 26 mmol/L (ref 22–32)
Calcium: 9 mg/dL (ref 8.9–10.3)
Chloride: 103 mmol/L (ref 98–111)
Creatinine, Ser: 0.93 mg/dL (ref 0.44–1.00)
GFR calc Af Amer: >60 mL/min (ref >60)
GFR calc non Af Amer: >60 mL/min (ref >60)
Glucose, Bld: 82 mg/dL (ref 70–99)
Potassium: 3.7 mmol/L (ref 3.5–5.1)
Sodium: 138 mmol/L (ref 135–145)
Total Bilirubin: 0.6 mg/dL (ref 0.3–1.2)
Total Protein: 7.5 g/dL (ref 6.5–8.1)

## 2019-01-21 LAB — URINALYSIS, ROUTINE W REFLEX MICROSCOPIC
Bilirubin Urine: NEGATIVE
Glucose, UA: NEGATIVE mg/dL
Hgb urine dipstick: NEGATIVE
Ketones, ur: NEGATIVE mg/dL
Leukocytes,Ua: NEGATIVE
Nitrite: NEGATIVE
Protein, ur: NEGATIVE mg/dL
Specific Gravity, Urine: 1.01 (ref 1.005–1.030)
pH: 6.5 (ref 5.0–8.0)

## 2019-01-21 LAB — PREGNANCY, URINE: Preg Test, Ur: NEGATIVE

## 2019-01-21 LAB — LIPASE, BLOOD: Lipase: 30 U/L (ref 11–51)

## 2019-01-21 LAB — WET PREP, GENITAL
Clue Cells Wet Prep HPF POC: NONE SEEN
Sperm: NONE SEEN
Trich, Wet Prep: NONE SEEN
Yeast Wet Prep HPF POC: NONE SEEN

## 2019-01-21 MED ORDER — METHOCARBAMOL 500 MG PO TABS: 500.0000 mg | ORAL_TABLET | Freq: Three times a day (TID) | ORAL | 0 refills | Status: DC | PRN

## 2019-01-21 MED ORDER — LIDOCAINE 5 % EX PTCH: 1.0000 | MEDICATED_PATCH | CUTANEOUS | 0 refills | Status: DC

## 2019-01-21 MED ORDER — ACETAMINOPHEN 325 MG PO TABS
650.0000 mg | ORAL_TABLET | Freq: Once | ORAL | Status: AC
  Administered 2019-01-21: 650 mg via ORAL
  Filled 2019-01-21: qty 2

## 2019-01-21 NOTE — ED Triage Notes (Signed)
Pt c/o lower back and abdominal pain for over a week. Pt reports some urgency with urination.

## 2019-01-21 NOTE — ED Notes (Signed)
ED Provider at bedside. 

## 2019-01-21 NOTE — ED Provider Notes (Signed)
[facility] EMERGENCY DEPARTMENT Provider Note   CSN: [id number] Arrival date & time: 01/21/19  1420     History Chief Complaint  Patient presents with  . Back Pain    Diane Patel is a 38 y.o. female with a history of HTN, anxiety, depression, anemia, PCOS, OSA, & prior abdominal surgeries including appendectomy, cholecystectomy, & prior chromopertubation with D&C who presents to the ED with complaints of back pain and lower abdominal pain for the past 1 week.  Patient states the pain originates in the bilateral lower back and wraps around to the lower abdomen/pelvic area.  She states is a constant ache with intermittent sharp pains.  No alleviating or aggravating factors.  Trying Tylenol without relief.  States she has had some urinary frequency and nausea at times.  Denies numbness, tingling, weakness, saddle anesthesia, incontinence to bowel/bladder, fever, chills, IV drug use, dysuria, or hx of cancer. Patient has not had prior back surgeries.  She denies emesis, melena, hematochezia, vaginal bleeding, or vaginal discharge. Patient is sexually active in a monogamous relationship without concern for STD.  She denies any direct trauma, she was doing some cleaning/moving things throughout the office.   HPI     Past Medical History:  Diagnosis Date  . Allergy   . Anemia   . Anxiety   . Asthma   . Back pain    resolved, no current problems per patient 11/25/18  . Chest pain    no current problems per pt 11/25/18  . Depression   . Essential hypertension 04/15/2018  . GERD (gastroesophageal reflux disease)   . Headaches, cluster    resolved per patient 11/25/18, no longer a problem  . Hyperlipidemia   . Obesity, morbid ([place])   . OSA (obstructive sleep apnea) 01/2014   Does not use CPAP  . PCOS (polycystic ovarian syndrome)   . Pre-diabetes 11/2018   diet controlled/exercise, no meds, does not check sugar    Patient Active Problem List   Diagnosis Date Noted  .  Precordial pain 05/21/2018  . Essential hypertension 04/15/2018  . Obesity hypoventilation syndrome ([place]) 04/27/2014  . Morbid obesity ([place]) 04/27/2014  . Nocturia more than twice per night 04/27/2014  . Sleep related headaches 04/27/2014  . 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 OSA (obstructive sleep apnea) 09/14/2013  . Upper airway cough syndrome 07/27/2013  . SOB (shortness of breath) 07/09/2013  . Family history of systemic lupus erythematosus 07/09/2013  . Left arm pain 06/21/2013  . 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 Edema 06/21/2013  . Anemia 12/22/2012  . Screening cholesterol level 12/22/2012  . Migraine 12/21/2012  . Asthma 07/13/2010  . MENORRHAGIA 03/30/2010  . ECZEMA 03/30/2010  . DYSPEPSIA 09/05/2009  . POLYCYSTIC OVARIAN DISEASE 09/04/2009  . OBESITY 09/04/2009  . DEPRESSION 09/04/2009  . GERD 09/04/2009  . FATIGUE 09/04/2009  . Atypical chest pain 09/04/2009    Past Surgical History:  Procedure Laterality Date  . APPENDECTOMY  2004  . CHOLECYSTECTOMY  2010  . CHROMOPERTUBATION N/A 11/26/2018   Procedure: CHROMOPERTUBATION;  Surgeon: Sanjuana Kava, MD;  Location: [facility];  Service: Gynecology;  Laterality: N/A;  . COLONOSCOPY     polyps  . DILATATION & CURRETTAGE/HYSTEROSCOPY WITH RESECTOCOPE N/A 11/26/2018   Procedure: DILATATION & CURETTAGE/DIAGNOSTIC HYSTEROSCOPY WITH POLYPECTOMY;  Surgeon: Sanjuana Kava, MD;  Location: [facility];  Service: Gynecology;  Laterality: N/A;  . LAPAROSCOPY N/A 11/26/2018   Procedure: LAPAROSCOPY OPERATIVE;  Surgeon: Sanjuana Kava, MD;  Location: [place];  Service: Gynecology;  Laterality:  N/A;  . TONSILLECTOMY AND ADENOIDECTOMY  1985  . UPPER GI ENDOSCOPY    . WISDOM TOOTH EXTRACTION       OB History   No obstetric history on file.     Family History  Problem Relation Age of Onset  . Cancer Maternal Grandmother        ? stomach cancer  . Diabetes Maternal Grandmother   . Hypertension Mother   . Diabetes Mother   . Heart disease Mother    . Polycystic ovary syndrome Mother   . Cancer - Ovarian Mother   . Diabetes Father   . Hypertension Father   . Emphysema Father        smoked  . Diabetes Sister   . Pancreatitis Sister   . Cancer Maternal Grandfather        bone  . Glaucoma Maternal Grandfather   . Osteoporosis Maternal Grandfather   . Hypertension Paternal Grandfather   . Mental illness Paternal Grandfather   . Asthma Brother     Social History   Tobacco Use  . Smoking status: Never Smoker  . Smokeless tobacco: Never Used  Substance Use Topics  . Alcohol use: No  . Drug use: No    Home Medications Prior to Admission medications   Medication Sig Start Date End Date Taking? Authorizing Provider  albuterol (PROVENTIL HFA;VENTOLIN HFA) 108 (90 Base) MCG/ACT inhaler Inhale 1-2 puffs into the lungs every 6 (six) hours as needed for wheezing or shortness of breath. 04/18/17   Khatri, Hina, PA-C  Ascorbic Acid (VITAMIN C PO) Take 1 tablet by mouth daily.    [provider]  Cyanocobalamin (B-12 COMPLIANCE INJECTION IJ) Inject as directed every 30 (thirty) days. Last injection 11/2018    [provider]  fluticasone (FLONASE) 50 MCG/ACT nasal spray Place 1 spray into both nostrils daily. Patient taking differently: Place 1 spray into both nostrils as needed.  04/18/17   Khatri, Hina, PA-C  hydrALAZINE (APRESOLINE) 25 MG tablet Take 1 tablet (25 mg total) by mouth 2 (two) times daily. 07/16/18 10/14/18  Patwardhan, Reynold Bowen, MD  HYDROcodone-acetaminophen (NORCO/VICODIN) 5-325 MG tablet take 1-2 tablets every 6 hours as needed for post operative pain 11/26/18   Earnstine Regal, PA-C  metFORMIN (GLUCOPHAGE) 500 MG tablet Take by mouth 2 (two) times daily with a meal.    [provider]  methocarbamol (ROBAXIN) 500 MG tablet Take 1 tablet (500 mg total) by mouth every 8 (eight) hours as needed for muscle spasms. 03/24/16   Ward, Delice Bison, DO  ondansetron (ZOFRAN ODT) 4 MG disintegrating tablet Take  1 tablet (4 mg total) by mouth every 8 (eight) hours as needed for nausea or vomiting. Patient not taking: Reported on 05/21/2018 05/07/17   Duffy Bruce, MD  pantoprazole (PROTONIX) 40 MG tablet Take 40 mg by mouth daily.    [provider]  Prenatal Vit-Iron Carbonyl-FA (PNV TABS 29-1) 29-1 MG TABS Take 1 tablet by mouth daily. 02/02/18   [provider]    Allergies    Ace inhibitors, Pineapple, Flexeril [cyclobenzaprine], Lactose intolerance (gi), Chocolate, Cocoa, and Naproxen  Review of Systems   Review of Systems  Constitutional: Negative for chills and fever.  Respiratory: Negative for shortness of breath.   Cardiovascular: Negative for chest pain.  Gastrointestinal: Positive for abdominal pain and nausea. Negative for anal bleeding, blood in stool, constipation and vomiting.  Genitourinary: Positive for urgency. Negative for dysuria.  Musculoskeletal: Positive for back pain.  Neurological: Negative for  weakness and numbness.       Negative for incontinence or saddle anesthesia.  All other systems reviewed and are negative.  Physical Exam Updated Vital Signs BP (!) 141/102 (BP Location: Left Arm)   Pulse 99   Temp 98.8 F (37.1 C) (Oral)   Resp 20   Ht 5\' 2"  (1.575 m)   Wt (!) 140.2 kg   LMP 11/24/2018   SpO2 100%   BMI 56.52 kg/m   Physical Exam Vitals and nursing note reviewed. Exam conducted with a chaperone present.  Constitutional:      General: She is not in acute distress.    Appearance: She is well-developed. She is not toxic-appearing.  HENT:     Head: Normocephalic and atraumatic.  Eyes:     General:        Right eye: No discharge.        Left eye: No discharge.     Conjunctiva/sclera: Conjunctivae normal.  Cardiovascular:     Rate and Rhythm: Normal rate and regular rhythm.  Pulmonary:     Effort: Pulmonary effort is normal. No respiratory distress.     Breath sounds: Normal breath sounds. No wheezing, rhonchi or rales.    Abdominal:     General: There is no distension.     Palpations: Abdomen is soft.     Tenderness: There is abdominal tenderness in the suprapubic area. There is no guarding or rebound.  Genitourinary:    Labia:        Right: No lesion.        Left: No lesion.      Cervix: No discharge or friability.     Adnexa:        Right: No mass.         Left: No mass.       Comments: Diffuse discomfort throughout bimanual exam. Musculoskeletal:     Cervical back: Normal range of motion and neck supple. No spinous process tenderness or muscular tenderness.     Comments: No obvious deformity, appreciable swelling, erythema, ecchymosis, significant open wounds, or increased warmth.  Extremities: Normal ROM. Nontender.  Back: No point/focal vertebral tenderness, no palpable step off or crepitus.  Diffusely tender throughout the lumbar region including midline and bilateral paraspinal muscles.  Skin:    General: Skin is warm and dry.     Findings: No rash.  Neurological:     Mental Status: She is alert.     Deep Tendon Reflexes:     Reflex Scores:      Patellar reflexes are 2+ on the right side and 2+ on the left side.    Comments: Sensation grossly intact to bilateral lower extremities. 5/5 symmetric strength with plantar/dorsiflexion bilaterally. Gait is intact without obvious foot drop.   Psychiatric:        Behavior: Behavior normal.    ED Results / Procedures / Treatments   Labs (all labs ordered are listed, but only abnormal results are displayed) Labs Reviewed  WET PREP, GENITAL - Abnormal; Notable for the following components:      Result Value   WBC, Wet Prep HPF POC FEW (*)    All other components within normal limits  CBC WITH DIFFERENTIAL/PLATELET - Abnormal; Notable for the following components:   Hemoglobin 11.9 (*)    MCH 24.5 (*)    Platelets 426 (*)    All other components within normal limits  URINALYSIS, ROUTINE W REFLEX MICROSCOPIC  PREGNANCY, URINE  COMPREHENSIVE  METABOLIC PANEL  LIPASE, BLOOD  GC/CHLAMYDIA PROBE AMP ([place]) NOT AT [facility]    EKG None  Radiology US PELVIC COMPLETE W TRANSVAGINAL AND TORSION R/O  Result Date: 01/21/2019 CLINICAL DATA:  Low back pain, pelvic pain EXAM: TRANSABDOMINAL AND TRANSVAGINAL ULTRASOUND OF PELVIS DOPPLER ULTRASOUND OF OVARIES TECHNIQUE: Both transabdominal and transvaginal ultrasound examinations of the pelvis were performed. Transabdominal technique was performed for global imaging of the pelvis including uterus, ovaries, adnexal regions, and pelvic cul-de-sac. It was necessary to proceed with endovaginal exam following the transabdominal exam to visualize the uterus, endometrium, ovaries and adnexa. Color and duplex Doppler ultrasound was utilized to evaluate blood flow to the ovaries. COMPARISON:  03/12/2012 FINDINGS: Uterus Measurements: 7.9 x 4.8 x 5.3 cm = volume: 105 mL. 2 cm anterior intramural fibroid. Endometrium Thickness: 6 mm in thickness.  No focal abnormality visualized. Right ovary Measurements: 3.2 x 1.8 x 1.7 cm = volume: 4.9 mL. Ovary is poorly visualized. No visible ovarian or adnexal mass. Left ovary Measurements: 3.5 x 2.8 x 2.4 cm = volume: 12.2 mL. Ovaries poorly visualized. No visible ovarian or adnexal mass. Pulsed Doppler evaluation of both ovaries unable to visualize color or dopplerable waveforms in either ovary. Other findings No abnormal free fluid. IMPRESSION: Both ovaries are visualized but difficult to see and evaluate. No color or Doppler blood flow can be documented in either ovary, likely technical. No visible adnexal mass. Small uterine fibroid. Electronically Signed   By: Rolm Baptise M.D.   On: 01/21/2019 17:46    Procedures Procedures (including critical care time)  Medications Ordered in ED Medications  acetaminophen (TYLENOL) tablet 650 mg (650 mg Oral Given 01/21/19 1622)    ED Course/MDM I have reviewed the triage vital signs and the nursing notes.  Pertinent  labs & imaging results that were available during my care of the patient were reviewed by me and considered in my medical decision making (see chart for details).  Patient presents to the emergency department with complaints of lower back and lower abdominal pain for the past 1 week.  Patient is nontoxic-appearing, resting comfortably, vitals WNL with exception of elevated blood pressure, doubt HTN emergency.  On exam patient has diffuse lumbar tenderness, suprapubic abdominal tenderness, as well as diffuse tenderness on bimanual exam.  No peritoneal signs. Will obtain ultrasound for further assessment.  CBC: No leukocytosis.  Anemia with Hgb/HCT fairly similar to prior.  Mild thrombocytosis. CMP: No significant electrolyte derangement.  LFTs and renal function WNL. Lipase: WNL. Wet prep: Few WBCs, no BV, trichomoniasis, or yeast. Pregnancy test: Negative Urinalysis: No UTI.  No hematuria  [place]: Both ovaries are visualized but difficult to see and evaluate. No color or Doppler blood flow can be documented in either ovary, likely technical. No visible adnexal mass. Small uterine fibroid  Pain is bilateral, feel torsion is less likely. Pregnancy test negative, doubt ectopic. No significant vaginal discharge, patient is monogamous relationship with her husband and states she had recent STD testing that was negative, does not necessarily seem consistent with PID-GC/chlamydia collected.  She is status post appendectomy and cholecystectomy.  She is not having any vomiting or constipation issues to suggest small bowel obstruction.  Do not suspect perforation.  Also do not suspect pancreatitis or diverticulitis.  No hematuria, bilateral pain, does not seem colicky, doubt nephrolithiasis.  She does have a history of PCOS does have a uterine fibroid, these may be contributory to her symptoms, her back pain also seems somewhat muscular in origin, but unclear definitive  cause.  No back pain red flags. Will trial  robaxin as she has tolerated this previously well, no NSAIDs given her allergy, will also try lidoderm patches. PCP/OBGYN follow up. I discussed results, treatment plan, need for follow-up, and return precautions with the patient. Provided opportunity for questions, patient confirmed understanding and is in agreement with plan.    Final Clinical Impression(s) / ED Diagnoses Final diagnoses:  Abdominal pain  Acute bilateral low back pain without sciatica  Fibroid    Rx / DC Orders ED Discharge Orders         Ordered    methocarbamol (ROBAXIN) 500 MG tablet  Every 8 hours PRN     01/21/19 1825    lidocaine (LIDODERM) 5 %  Every 24 hours     01/21/19 1825           Amaryllis Dyke, PA-C 01/21/19 1827    Dorie Rank, MD 01/25/19 [phone]

## 2019-01-21 NOTE — Discharge Instructions (Signed)
You were seen in the emergency department today for abdominal and back pain.  Your work-up was overall reassuring.  Your labs did not show any substantial abnormalities.  Your ultrasound showed that you have a uterine fibroid, please see attached handout.  We are sending you home with the following medicines to help with your symptoms:  -Lidoderm patch-is a topical patch to help numb/to the muscle please apply to your lower back once per day. - Robaxin is the muscle relaxer I have prescribed, this is meant to help with muscle tightness. Be aware that this medication may make you drowsy therefore the first time you take this it should be at a time you are in an environment where you can rest. Do not drive or operate heavy machinery when taking this medication. Do not drink alcohol or take other sedating medications with this medicine such as narcotics or benzodiazepines.   You make take Tylenol per over the counter dosing with these medications.   We have prescribed you new medication(s) today. Discuss the medications prescribed today with your pharmacist as they can have adverse effects and interactions with your other medicines including over the counter and prescribed medications. Seek medical evaluation if you start to experience new or abnormal symptoms after taking one of these medicines, seek care immediately if you start to experience difficulty breathing, feeling of your throat closing, facial swelling, or rash as these could be indications of a more serious allergic reaction   Please apply heat to the lower back and lower abdomen to help as well.  Please follow-up with your OB/GYN as well as your primary care provider within 3 days.  Return to the ER for new or worsening symptoms including but not limited to increased pain, burning with urination, fever, blood in your stool, inability to have a bowel movement, inability to keep fluids down, or any other concerns.

## 2019-01-22 LAB — NOVEL CORONAVIRUS, NAA: SARS-CoV-2, NAA: NOT DETECTED

## 2019-01-25 LAB — GC/CHLAMYDIA PROBE AMP (~~LOC~~) NOT AT ARMC
Chlamydia: NEGATIVE
Neisseria Gonorrhea: NEGATIVE

## 2019-04-11 ENCOUNTER — Telehealth (HOSPITAL_BASED_OUTPATIENT_CLINIC_OR_DEPARTMENT_OTHER): Payer: Self-pay | Admitting: Emergency Medicine

## 2019-04-11 ENCOUNTER — Encounter (HOSPITAL_BASED_OUTPATIENT_CLINIC_OR_DEPARTMENT_OTHER): Payer: Self-pay | Admitting: Emergency Medicine

## 2019-04-11 ENCOUNTER — Emergency Department (HOSPITAL_BASED_OUTPATIENT_CLINIC_OR_DEPARTMENT_OTHER)
Admission: EM | Admit: 2019-04-11 | Discharge: 2019-04-11 | Disposition: A | Discharge status: Home / Self Care | Payer: 59 | Attending: Emergency Medicine | Admitting: Emergency Medicine

## 2019-04-11 ENCOUNTER — Other Ambulatory Visit: Payer: Self-pay

## 2019-04-11 DIAGNOSIS — L02411 Cutaneous abscess of right axilla: Secondary | ICD-10-CM

## 2019-04-11 MED ORDER — HYDROCODONE-ACETAMINOPHEN 5-325 MG PO TABS
1.0000 | ORAL_TABLET | Freq: Once | ORAL | Status: DC
  Filled 2019-04-11: qty 1

## 2019-04-11 MED ORDER — LIDOCAINE HCL 2 % IJ SOLN
20.0000 mL | Freq: Once | INTRAMUSCULAR | Status: AC
  Administered 2019-04-11: 400 mg
  Filled 2019-04-11: qty 20

## 2019-04-11 MED ORDER — IBUPROFEN 400 MG PO TABS
600.0000 mg | ORAL_TABLET | Freq: Once | ORAL | Status: AC
  Administered 2019-04-11: 600 mg via ORAL
  Filled 2019-04-11: qty 1

## 2019-04-11 NOTE — ED Triage Notes (Signed)
Pt c/o right axillary "lump" under right arm x 1 week. No drainage reported, pt c/o pain and tenderness. Redness noted. Pt denies fever

## 2019-04-11 NOTE — ED Notes (Signed)
Gauze applied with tape to wound.

## 2019-04-11 NOTE — Discharge Instructions (Addendum)
Return here in 2 days for recheck and packing removal.  Keep the area clean and dry.also keep a dressing over it as well.  Use warm heat around it as well.

## 2019-04-11 NOTE — ED Provider Notes (Signed)
[facility] EMERGENCY DEPARTMENT Provider Note   CSN: [id number] Arrival date & time: 04/11/19  1227     History Chief Complaint  Patient presents with  . Abscess    Diane Patel is a 39 y.o. female.  HPI Patient presents to the emergency department with an axillary abscess that started a week ago. The patient states that certain movements palpation make the pain worse. The patient states she did not take any medications prior to arrival for her symptoms. Patient denies any fevers, nausea, vomiting, weakness, dizziness, headache, blurred vision or syncope.    Past Medical History:  Diagnosis Date  . Allergy   . Anemia   . Anxiety   . Asthma   . Back pain    resolved, no current problems per patient 11/25/18  . Chest pain    no current problems per pt 11/25/18  . Depression   . Essential hypertension 04/15/2018  . GERD (gastroesophageal reflux disease)   . Headaches, cluster    resolved per patient 11/25/18, no longer a problem  . Hyperlipidemia   . Obesity, morbid ([place])   . OSA (obstructive sleep apnea) 01/2014   Does not use CPAP  . PCOS (polycystic ovarian syndrome)   . Pre-diabetes 11/2018   diet controlled/exercise, no meds, does not check sugar    Patient Active Problem List   Diagnosis Date Noted  . Precordial pain 05/21/2018  . Essential hypertension 04/15/2018  . Obesity hypoventilation syndrome ([place]) 04/27/2014  . Morbid obesity ([place]) 04/27/2014  . Nocturia more than twice per night 04/27/2014  . Sleep related headaches 04/27/2014  . 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 OSA (obstructive sleep apnea) 09/14/2013  . Upper airway cough syndrome 07/27/2013  . SOB (shortness of breath) 07/09/2013  . Family history of systemic lupus erythematosus 07/09/2013  . Left arm pain 06/21/2013  . 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 Edema 06/21/2013  . Anemia 12/22/2012  . Screening cholesterol level 12/22/2012  . Migraine 12/21/2012  . Asthma 07/13/2010  .  MENORRHAGIA 03/30/2010  . ECZEMA 03/30/2010  . DYSPEPSIA 09/05/2009  . POLYCYSTIC OVARIAN DISEASE 09/04/2009  . OBESITY 09/04/2009  . DEPRESSION 09/04/2009  . GERD 09/04/2009  . FATIGUE 09/04/2009  . Atypical chest pain 09/04/2009    Past Surgical History:  Procedure Laterality Date  . APPENDECTOMY  2004  . CHOLECYSTECTOMY  2010  . CHROMOPERTUBATION N/A 11/26/2018   Procedure: CHROMOPERTUBATION;  Surgeon: Sanjuana Kava, MD;  Location: [facility];  Service: Gynecology;  Laterality: N/A;  . COLONOSCOPY     polyps  . DILATATION & CURRETTAGE/HYSTEROSCOPY WITH RESECTOCOPE N/A 11/26/2018   Procedure: DILATATION & CURETTAGE/DIAGNOSTIC HYSTEROSCOPY WITH POLYPECTOMY;  Surgeon: Sanjuana Kava, MD;  Location: [facility];  Service: Gynecology;  Laterality: N/A;  . LAPAROSCOPY N/A 11/26/2018   Procedure: LAPAROSCOPY OPERATIVE;  Surgeon: Sanjuana Kava, MD;  Location: [facility];  Service: Gynecology;  Laterality: N/A;  . TONSILLECTOMY AND ADENOIDECTOMY  1985  . UPPER GI ENDOSCOPY    . WISDOM TOOTH EXTRACTION       OB History   No obstetric history on file.     Family History  Problem Relation Age of Onset  . Cancer Maternal Grandmother        ? stomach cancer  . Diabetes Maternal Grandmother   . Hypertension Mother   . Diabetes Mother   . Heart disease Mother   . Polycystic ovary syndrome Mother   . Cancer - Ovarian Mother   . Diabetes Father   . Hypertension Father   .  Emphysema Father        smoked  . Diabetes Sister   . Pancreatitis Sister   . Cancer Maternal Grandfather        bone  . Glaucoma Maternal Grandfather   . Osteoporosis Maternal Grandfather   . Hypertension Paternal Grandfather   . Mental illness Paternal Grandfather   . Asthma Brother     Social History   Tobacco Use  . Smoking status: Never Smoker  . Smokeless tobacco: Never Used  Substance Use Topics  . Alcohol use: No  . Drug use: No    Home Medications Prior to Admission medications   Medication Sig Start Date  End Date Taking? Authorizing Provider  albuterol (PROVENTIL HFA;VENTOLIN HFA) 108 (90 Base) MCG/ACT inhaler Inhale 1-2 puffs into the lungs every 6 (six) hours as needed for wheezing or shortness of breath. 04/18/17   Khatri, Hina, PA-C  Ascorbic Acid (VITAMIN C PO) Take 1 tablet by mouth daily.    [provider]  Cyanocobalamin (B-12 COMPLIANCE INJECTION IJ) Inject as directed every 30 (thirty) days. Last injection 11/2018    [provider]  fluticasone (FLONASE) 50 MCG/ACT nasal spray Place 1 spray into both nostrils daily. Patient taking differently: Place 1 spray into both nostrils as needed.  04/18/17   Khatri, Hina, PA-C  hydrALAZINE (APRESOLINE) 25 MG tablet Take 1 tablet (25 mg total) by mouth 2 (two) times daily. 07/16/18 10/14/18  Patwardhan, Reynold Bowen, MD  HYDROcodone-acetaminophen (NORCO/VICODIN) 5-325 MG tablet take 1-2 tablets every 6 hours as needed for post operative pain 11/26/18   Earnstine Regal, PA-C  lidocaine (LIDODERM) 5 % Place 1 patch onto the skin daily. Apply 1 patch to lower back once per day.  Remove & Discard patch within 12 hours 01/21/19   Petrucelli, Samantha R, PA-C  metFORMIN (GLUCOPHAGE) 500 MG tablet Take by mouth 2 (two) times daily with a meal.    [provider]  methocarbamol (ROBAXIN) 500 MG tablet Take 1 tablet (500 mg total) by mouth every 8 (eight) hours as needed for muscle spasms. 01/21/19   Petrucelli, Samantha R, PA-C  pantoprazole (PROTONIX) 40 MG tablet Take 40 mg by mouth daily.    [provider]  Prenatal Vit-Iron Carbonyl-FA (PNV TABS 29-1) 29-1 MG TABS Take 1 tablet by mouth daily. 02/02/18   [provider]    Allergies    Ace inhibitors, Pineapple, Flexeril [cyclobenzaprine], Lactose intolerance (gi), Chocolate, Cocoa, and Naproxen  Review of Systems   Review of Systems  Physical Exam Updated Vital Signs BP (!) 136/97 (BP Location: Left Arm)   Pulse (!) 104   Temp 98.5 F (36.9 C) (Oral)    Resp 20   Ht 5\' 2"  (1.575 m)   Wt (!) 140.6 kg   LMP 12/06/2018 (Approximate)   SpO2 100%   BMI 56.70 kg/m   Physical Exam Vitals and nursing note reviewed.  Constitutional:      General: She is not in acute distress.    Appearance: She is well-developed.  HENT:     Head: Normocephalic and atraumatic.  Eyes:     Pupils: Pupils are equal, round, and reactive to light.  Pulmonary:     Effort: Pulmonary effort is normal.  Musculoskeletal:       Arms:  Skin:    General: Skin is warm and dry.  Neurological:     Mental Status: She is alert and oriented to person, place, and time.     ED Results / Procedures /  Treatments   Labs (all labs ordered are listed, but only abnormal results are displayed) Labs Reviewed - No data to display  EKG None  Radiology No results found.  Procedures Procedures (including critical care time)  Medications Ordered in ED Medications  HYDROcodone-acetaminophen (NORCO/VICODIN) 5-325 MG per tablet 1 tablet (has no administration in time range)  lidocaine (XYLOCAINE) 2 % (with pres) injection 400 mg (400 mg Infiltration Given by Other 04/11/19 1419)    ED Course  I have reviewed the triage vital signs and the nursing notes.  Pertinent labs & imaging results that were available during my care of the patient were reviewed by me and considered in my medical decision making (see chart for details).    MDM Rules/Calculators/A&P                      INCISION AND DRAINAGE Performed by: Resa Miner Rusty Glodowski Consent: Verbal consent obtained. Risks and benefits: risks, benefits and alternatives were discussed Type: abscess  Body area:   Anesthesia: local infiltration  Incision was made with a scalpel.  Local anesthetic: lidocaine 2 % wo epinephrine  Anesthetic total: 6 ml  Complexity: complex Blunt dissection to break up loculations  Drainage: purulent  Drainage amount: large  Packing material: 1/4 in iodoform gauze  Patient  tolerance: Patient tolerated the procedure well with no immediate complications.  Patient is advised to return here in 2 days for recheck and packing removal. I did pack the wound because of the large area that encompassed. Patient is advised to use warm compresses around the area. Told to return here for any worsening in her condition in the interim.    Final Clinical Impression(s) / ED Diagnoses Final diagnoses:  None    Rx / DC Orders ED Discharge Orders    None       Rebeca Allegra 04/11/19 [place], Nathan, MD 04/15/19 [phone]

## 2019-04-13 ENCOUNTER — Emergency Department (HOSPITAL_BASED_OUTPATIENT_CLINIC_OR_DEPARTMENT_OTHER)
Admission: EM | Admit: 2019-04-13 | Discharge: 2019-04-13 | Disposition: A | Discharge status: Home / Self Care | Payer: 59 | Attending: Emergency Medicine | Admitting: Emergency Medicine

## 2019-04-13 ENCOUNTER — Other Ambulatory Visit: Payer: Self-pay

## 2019-04-13 ENCOUNTER — Encounter (HOSPITAL_BASED_OUTPATIENT_CLINIC_OR_DEPARTMENT_OTHER): Payer: Self-pay | Admitting: *Deleted

## 2019-04-13 ENCOUNTER — Emergency Department (HOSPITAL_BASED_OUTPATIENT_CLINIC_OR_DEPARTMENT_OTHER): Payer: 59

## 2019-04-13 DIAGNOSIS — Z5189 Encounter for other specified aftercare: Secondary | ICD-10-CM

## 2019-04-13 DIAGNOSIS — I10 Essential (primary) hypertension: Secondary | ICD-10-CM | POA: Diagnosis not present

## 2019-04-13 DIAGNOSIS — Z7984 Long term (current) use of oral hypoglycemic drugs: Secondary | ICD-10-CM | POA: Diagnosis not present

## 2019-04-13 DIAGNOSIS — Z79899 Other long term (current) drug therapy: Secondary | ICD-10-CM | POA: Insufficient documentation

## 2019-04-13 DIAGNOSIS — L02411 Cutaneous abscess of right axilla: Secondary | ICD-10-CM | POA: Insufficient documentation

## 2019-04-13 DIAGNOSIS — R531 Weakness: Secondary | ICD-10-CM | POA: Insufficient documentation

## 2019-04-13 DIAGNOSIS — Z20822 Contact with and (suspected) exposure to covid-19: Secondary | ICD-10-CM | POA: Insufficient documentation

## 2019-04-13 DIAGNOSIS — Z888 Allergy status to other drugs, medicaments and biological substances status: Secondary | ICD-10-CM | POA: Insufficient documentation

## 2019-04-13 DIAGNOSIS — R0981 Nasal congestion: Secondary | ICD-10-CM | POA: Insufficient documentation

## 2019-04-13 DIAGNOSIS — R7303 Prediabetes: Secondary | ICD-10-CM | POA: Insufficient documentation

## 2019-04-13 DIAGNOSIS — R509 Fever, unspecified: Secondary | ICD-10-CM | POA: Insufficient documentation

## 2019-04-13 DIAGNOSIS — E739 Lactose intolerance, unspecified: Secondary | ICD-10-CM | POA: Insufficient documentation

## 2019-04-13 DIAGNOSIS — R519 Headache, unspecified: Secondary | ICD-10-CM | POA: Insufficient documentation

## 2019-04-13 DIAGNOSIS — J029 Acute pharyngitis, unspecified: Secondary | ICD-10-CM | POA: Insufficient documentation

## 2019-04-13 DIAGNOSIS — Z886 Allergy status to analgesic agent status: Secondary | ICD-10-CM | POA: Diagnosis not present

## 2019-04-13 DIAGNOSIS — R6889 Other general symptoms and signs: Secondary | ICD-10-CM

## 2019-04-13 DIAGNOSIS — Z91018 Allergy to other foods: Secondary | ICD-10-CM | POA: Insufficient documentation

## 2019-04-13 LAB — CBC WITH DIFFERENTIAL/PLATELET
Abs Immature Granulocytes: 0.02 10*3/uL (ref 0.00–0.07)
Basophils Absolute: 0.1 10*3/uL (ref 0.0–0.1)
Basophils Relative: 1 %
Eosinophils Absolute: 0.1 10*3/uL (ref 0.0–0.5)
Eosinophils Relative: 2 %
HCT: 42.2 % (ref 36.0–46.0)
Hemoglobin: 12.5 g/dL (ref 12.0–15.0)
Immature Granulocytes: 0 %
Lymphocytes Relative: 21 %
Lymphs Abs: 1.2 10*3/uL (ref 0.7–4.0)
MCH: 22.9 pg — ABNORMAL LOW (ref 26.0–34.0)
MCHC: 29.6 g/dL — ABNORMAL LOW (ref 30.0–36.0)
MCV: 77.4 fL — ABNORMAL LOW (ref 80.0–100.0)
Monocytes Absolute: 0.6 10*3/uL (ref 0.1–1.0)
Monocytes Relative: 10 %
Neutro Abs: 4 10*3/uL (ref 1.7–7.7)
Neutrophils Relative %: 66 %
Platelets: 310 10*3/uL (ref 150–400)
RBC: 5.45 MIL/uL — ABNORMAL HIGH (ref 3.87–5.11)
RDW: 15.9 % — ABNORMAL HIGH (ref 11.5–15.5)
WBC: 6 10*3/uL (ref 4.0–10.5)
nRBC: 0 % (ref 0.0–0.2)

## 2019-04-13 LAB — URINALYSIS, ROUTINE W REFLEX MICROSCOPIC
Bilirubin Urine: NEGATIVE
Glucose, UA: NEGATIVE mg/dL
Hgb urine dipstick: NEGATIVE
Ketones, ur: NEGATIVE mg/dL
Leukocytes,Ua: NEGATIVE
Nitrite: NEGATIVE
Protein, ur: NEGATIVE mg/dL
Specific Gravity, Urine: 1.02 (ref 1.005–1.030)
pH: 7 (ref 5.0–8.0)

## 2019-04-13 LAB — BASIC METABOLIC PANEL
Anion gap: 8 (ref 5–15)
BUN: 6 mg/dL (ref 6–20)
CO2: 27 mmol/L (ref 22–32)
Calcium: 8.8 mg/dL — ABNORMAL LOW (ref 8.9–10.3)
Chloride: 104 mmol/L (ref 98–111)
Creatinine, Ser: 0.85 mg/dL (ref 0.44–1.00)
GFR calc Af Amer: >60 mL/min (ref >60)
GFR calc non Af Amer: >60 mL/min (ref >60)
Glucose, Bld: 82 mg/dL (ref 70–99)
Potassium: 3.8 mmol/L (ref 3.5–5.1)
Sodium: 139 mmol/L (ref 135–145)

## 2019-04-13 LAB — PREGNANCY, URINE: Preg Test, Ur: NEGATIVE

## 2019-04-13 LAB — SARS CORONAVIRUS 2 (TAT 6-24 HRS): SARS Coronavirus 2: NEGATIVE

## 2019-04-13 MED ORDER — SODIUM CHLORIDE 0.9 % IV BOLUS
1000.0000 mL | Freq: Once | INTRAVENOUS | Status: AC
  Administered 2019-04-13: 1000 mL via INTRAVENOUS

## 2019-04-13 MED ORDER — IPRATROPIUM BROMIDE 0.03 % NA SOLN: 2.0000 | Freq: Three times a day (TID) | NASAL | 0 refills | Status: DC | PRN

## 2019-04-13 MED ORDER — ACETAMINOPHEN 500 MG PO TABS: 500.0000 mg | ORAL_TABLET | Freq: Four times a day (QID) | ORAL | 0 refills | Status: AC | PRN

## 2019-04-13 MED ORDER — DIPHENHYDRAMINE HCL 50 MG/ML IJ SOLN
25.0000 mg | Freq: Once | INTRAMUSCULAR | Status: AC
  Administered 2019-04-13: 25 mg via INTRAVENOUS
  Filled 2019-04-13: qty 1

## 2019-04-13 MED ORDER — PROCHLORPERAZINE EDISYLATE 10 MG/2ML IJ SOLN
10.0000 mg | Freq: Once | INTRAMUSCULAR | Status: AC
  Administered 2019-04-13: 10 mg via INTRAVENOUS
  Filled 2019-04-13: qty 2

## 2019-04-13 NOTE — ED Triage Notes (Signed)
She was seen 2 days ago for axillary abscess. I&D with packing. Here for recheck. Stiffness in her neck and headache. Fever and chills yesterday.

## 2019-04-13 NOTE — ED Provider Notes (Signed)
[facility] EMERGENCY DEPARTMENT Provider Note   CSN: [id number] Arrival date & time: 04/13/19  1121     History Chief Complaint  Patient presents with  . Wound Check    Diane Patel is a 39 y.o. female with history of hypertension, GERD, hyperlipidemia, obesity, OSA presenting for evaluation of acute onset, persistent fevers, generalized weakness, decreased appetite since yesterday.  Reports fevers up to 101 F at home.  Also notes mild scratchy/sore throat, nasal congestion with sinus pressure.  Reports throbbing frontal headache similar to headaches she has had previously.  She denies vision changes, nausea, vomiting, chest pain, or shortness of breath.  No numbness or weakness of the extremities.  She has felt as though her neck has been stiff but is able to move it without difficulty.  She has taken Zyrtec and Tylenol with improvement in her symptoms.  No known sick contacts or Covid exposures but reports that she recently spent time with her 25-year-old niece.  Also of note she was seen and evaluated in the ED 2 days ago for a right axillary abscess.  She underwent I&D and tolerated the procedure without difficulty.  Reports persistent drainage but improvement in her pain.  No worsening swelling.   The history is provided by the patient.       Past Medical History:  Diagnosis Date  . Allergy   . Anemia   . Anxiety   . Asthma   . Back pain    resolved, no current problems per patient 11/25/18  . Chest pain    no current problems per pt 11/25/18  . Depression   . Essential hypertension 04/15/2018  . GERD (gastroesophageal reflux disease)   . Headaches, cluster    resolved per patient 11/25/18, no longer a problem  . Hyperlipidemia   . Obesity, morbid ([place])   . OSA (obstructive sleep apnea) 01/2014   Does not use CPAP  . PCOS (polycystic ovarian syndrome)   . Pre-diabetes 11/2018   diet controlled/exercise, no meds, does not check sugar    Patient Active  Problem List   Diagnosis Date Noted  . Precordial pain 05/21/2018  . Essential hypertension 04/15/2018  . Obesity hypoventilation syndrome ([place]) 04/27/2014  . Morbid obesity ([place]) 04/27/2014  . Nocturia more than twice per night 04/27/2014  . Sleep related headaches 04/27/2014  . 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 OSA (obstructive sleep apnea) 09/14/2013  . Upper airway cough syndrome 07/27/2013  . SOB (shortness of breath) 07/09/2013  . Family history of systemic lupus erythematosus 07/09/2013  . Left arm pain 06/21/2013  . 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 Edema 06/21/2013  . Anemia 12/22/2012  . Screening cholesterol level 12/22/2012  . Migraine 12/21/2012  . Asthma 07/13/2010  . MENORRHAGIA 03/30/2010  . ECZEMA 03/30/2010  . DYSPEPSIA 09/05/2009  . POLYCYSTIC OVARIAN DISEASE 09/04/2009  . OBESITY 09/04/2009  . DEPRESSION 09/04/2009  . GERD 09/04/2009  . FATIGUE 09/04/2009  . Atypical chest pain 09/04/2009    Past Surgical History:  Procedure Laterality Date  . APPENDECTOMY  2004  . CHOLECYSTECTOMY  2010  . CHROMOPERTUBATION N/A 11/26/2018   Procedure: CHROMOPERTUBATION;  Surgeon: Sanjuana Kava, MD;  Location: [facility];  Service: Gynecology;  Laterality: N/A;  . COLONOSCOPY     polyps  . DILATATION & CURRETTAGE/HYSTEROSCOPY WITH RESECTOCOPE N/A 11/26/2018   Procedure: DILATATION & CURETTAGE/DIAGNOSTIC HYSTEROSCOPY WITH POLYPECTOMY;  Surgeon: Sanjuana Kava, MD;  Location: [facility];  Service: Gynecology;  Laterality: N/A;  . LAPAROSCOPY N/A 11/26/2018  Procedure: LAPAROSCOPY OPERATIVE;  Surgeon: Sanjuana Kava, MD;  Location: [facility];  Service: Gynecology;  Laterality: N/A;  . TONSILLECTOMY AND ADENOIDECTOMY  1985  . UPPER GI ENDOSCOPY    . WISDOM TOOTH EXTRACTION       OB History   No obstetric history on file.     Family History  Problem Relation Age of Onset  . Cancer Maternal Grandmother        ? stomach cancer  . Diabetes Maternal Grandmother   . Hypertension Mother   .  Diabetes Mother   . Heart disease Mother   . Polycystic ovary syndrome Mother   . Cancer - Ovarian Mother   . Diabetes Father   . Hypertension Father   . Emphysema Father        smoked  . Diabetes Sister   . Pancreatitis Sister   . Cancer Maternal Grandfather        bone  . Glaucoma Maternal Grandfather   . Osteoporosis Maternal Grandfather   . Hypertension Paternal Grandfather   . Mental illness Paternal Grandfather   . Asthma Brother     Social History   Tobacco Use  . Smoking status: Never Smoker  . Smokeless tobacco: Never Used  Substance Use Topics  . Alcohol use: No  . Drug use: No    Home Medications Prior to Admission medications   Medication Sig Start Date End Date Taking? Authorizing Provider  acetaminophen (TYLENOL) 500 MG tablet Take 1 tablet (500 mg total) by mouth every 6 (six) hours as needed. 04/13/19   Colt Martelle A, PA-C  albuterol (PROVENTIL HFA;VENTOLIN HFA) 108 (90 Base) MCG/ACT inhaler Inhale 1-2 puffs into the lungs every 6 (six) hours as needed for wheezing or shortness of breath. 04/18/17   Khatri, Hina, PA-C  Ascorbic Acid (VITAMIN C PO) Take 1 tablet by mouth daily.    [provider]  Cyanocobalamin (B-12 COMPLIANCE INJECTION IJ) Inject as directed every 30 (thirty) days. Last injection 11/2018    [provider]  fluticasone (FLONASE) 50 MCG/ACT nasal spray Place 1 spray into both nostrils daily. Patient taking differently: Place 1 spray into both nostrils as needed.  04/18/17   Khatri, Hina, PA-C  hydrALAZINE (APRESOLINE) 25 MG tablet Take 1 tablet (25 mg total) by mouth 2 (two) times daily. 07/16/18 10/14/18  Nigel Mormon, MD  HYDROcodone-acetaminophen (NORCO/VICODIN) 5-325 MG tablet take 1-2 tablets every 6 hours as needed for post operative pain 11/26/18   Earnstine Regal, PA-C  ipratropium (ATROVENT) 0.03 % nasal spray Place 2 sprays into both nostrils 3 (three) times daily as needed for rhinitis. 04/13/19   Joleena Weisenburger A, PA-C   lidocaine (LIDODERM) 5 % Place 1 patch onto the skin daily. Apply 1 patch to lower back once per day.  Remove & Discard patch within 12 hours 01/21/19   Petrucelli, Samantha R, PA-C  metFORMIN (GLUCOPHAGE) 500 MG tablet Take by mouth 2 (two) times daily with a meal.    [provider]  methocarbamol (ROBAXIN) 500 MG tablet Take 1 tablet (500 mg total) by mouth every 8 (eight) hours as needed for muscle spasms. 01/21/19   Petrucelli, Samantha R, PA-C  pantoprazole (PROTONIX) 40 MG tablet Take 40 mg by mouth daily.    [provider]  Prenatal Vit-Iron Carbonyl-FA (PNV TABS 29-1) 29-1 MG TABS Take 1 tablet by mouth daily. 02/02/18   [provider]    Allergies    Ace inhibitors, Pineapple, Flexeril [cyclobenzaprine], Lactose intolerance (gi),  Chocolate, Cocoa, and Naproxen  Review of Systems   Review of Systems  Constitutional: Positive for chills, fatigue and fever.  HENT: Positive for congestion, sinus pressure and sore throat.   Respiratory: Negative for shortness of breath.   Cardiovascular: Negative for chest pain.  Gastrointestinal: Negative for abdominal pain, nausea and vomiting.  Neurological: Positive for headaches.  All other systems reviewed and are negative.   Physical Exam Updated Vital Signs BP (!) 149/99 (BP Location: Right Wrist)   Pulse 90   Temp 98.9 F (37.2 C) (Oral)   Resp 15   Ht 5' 2.5" (1.588 m)   Wt (!) 140.6 kg   SpO2 100%   BMI 55.79 kg/m   Physical Exam Vitals and nursing note reviewed.  Constitutional:      General: She is not in acute distress.    Appearance: She is well-developed. She is obese.  HENT:     Head: Normocephalic and atraumatic.     Nose: Congestion present.     Comments: No tenderness to percussion of the maxillary sinuses but there is tenderness to percussion of the bilaterally frontal sinuses. Eyes:     General:        Right eye: No discharge.        Left eye: No discharge.     Extraocular  Movements: Extraocular movements intact.     Conjunctiva/sclera: Conjunctivae normal.     Pupils: Pupils are equal, round, and reactive to light.  Neck:     Vascular: No JVD.     Trachea: No tracheal deviation.     Comments: No midline cervical spine tenderness.  Normal active range of motion of the cervical spine without restriction.  Bilateral paracervical muscle tenderness and spasm noted in the trapezius distribution. Cardiovascular:     Rate and Rhythm: Normal rate and regular rhythm.     Heart sounds: Normal heart sounds.  Pulmonary:     Effort: Pulmonary effort is normal.     Breath sounds: Normal breath sounds.  Abdominal:     General: Abdomen is flat. Bowel sounds are normal. There is no distension.     Palpations: Abdomen is soft.     Tenderness: There is no abdominal tenderness. There is no guarding or rebound.  Musculoskeletal:     Cervical back: Normal range of motion and neck supple. Tenderness present. No rigidity.  Skin:    General: Skin is warm and dry.     Findings: No erythema.     Comments: Right axilla with packing still in place, draining purulent fluid.  Mild surrounding induration, minimally tender to palpation.  No erythema.  Neurological:     Mental Status: She is alert.     Comments: Fluent speech, no facial droop, cranial nerves are grossly intact.  Patient moves extremities spontaneously without difficulty.  Psychiatric:        Behavior: Behavior normal.     ED Results / Procedures / Treatments   Labs (all labs ordered are listed, but only abnormal results are displayed) Labs Reviewed  BASIC METABOLIC PANEL - Abnormal; Notable for the following components:      Result Value   Calcium 8.8 (*)    All other components within normal limits  CBC WITH DIFFERENTIAL/PLATELET - Abnormal; Notable for the following components:   RBC 5.45 (*)    MCV 77.4 (*)    MCH 22.9 (*)    MCHC 29.6 (*)    RDW 15.9 (*)    All other components within  normal limits   SARS CORONAVIRUS 2 (TAT 6-24 HRS)  URINALYSIS, ROUTINE W REFLEX MICROSCOPIC  PREGNANCY, URINE    EKG None  Radiology DG Chest Portable 1 View  Result Date: 04/13/2019 CLINICAL DATA:  Fever EXAM: PORTABLE CHEST 1 VIEW COMPARISON:  March 04, 2018 FINDINGS: Lungs are clear. Heart size and pulmonary vascularity are normal. No adenopathy. No bone lesions. IMPRESSION: Lungs clear.  Cardiac silhouette within normal limits. Electronically Signed   By: Lowella Grip III M.D.   On: 04/13/2019 14:01    Procedures Procedures (including critical care time)  Medications Ordered in ED Medications  sodium chloride 0.9 % bolus 1,000 mL (0 mLs Intravenous Stopped 04/13/19 1526)  prochlorperazine (COMPAZINE) injection 10 mg (10 mg Intravenous Given 04/13/19 1356)  diphenhydrAMINE (BENADRYL) injection 25 mg (25 mg Intravenous Given 04/13/19 1357)    ED Course  I have reviewed the triage vital signs and the nursing notes.  Pertinent labs & imaging results that were available during my care of the patient were reviewed by me and considered in my medical decision making (see chart for details).    MDM Rules/Calculators/A&P                      Aireonna Romig was evaluated in Emergency Department on 04/13/2019 for the symptoms described in the history of present illness. She was evaluated in the context of the global COVID-19 pandemic, which necessitated consideration that the patient might be at risk for infection with the SARS-CoV-2 virus that causes COVID-19. Institutional protocols and algorithms that pertain to the evaluation of patients at risk for COVID-19 are in a state of rapid change based on information released by regulatory bodies including the CDC and federal and state organizations. These policies and algorithms were followed during the patient's care in the ED.   Patient presenting for evaluation of fevers, nasal congestion and sinus pressure for 2 days.  She was seen 2 days ago with  complaint of axillary abscess requiring I&D which she tolerated without difficulty.  With regards to the axilla abscess this appears to be quite well healing and she tolerated removal of packing without difficulty.  I was unable to express any more purulence from the wound after removal of packing and a sterile dry dressing was applied.  I suspect she likely has a viral illness.  In the ED she is afebrile and vital signs are stable.  She is nontoxic in appearance.  She is resting comfortably in no apparent distress but does appear fatigued.  Lungs are clear to auscultation bilaterally and chest x-ray shows no evidence of acute cardiopulmonary abnormalities.  She has some tenderness to percussion of the frontal sinuses.  She has no neck rigidity or meningeal signs.  No midline spine tenderness or fever in the ED.  I highly doubt bacterial meningitis.  She does have some bilateral muscle spasm in the trapezius distribution which is likely contributing to her headaches as well.  Lab work reviewed and interpreted by myself shows no leukocytosis, no anemia, no metabolic derangements, no renal insufficiency.  Her UA does not suggest UTI.  On reevaluation patient resting comfortably no apparent distress.  Offered outpatient Covid testing.  Discussed symptomatic management and quarantining at home per current CDC guidelines.  Recommend follow-up with PCP on an outpatient basis and discussed strict ED return precautions.  Patient verbalized understanding of and agreement with plan and patient is stable for discharge home at this time.  Final Clinical Impression(s) /  ED Diagnoses Final diagnoses:  Flu-like symptoms  Visit for wound check    Rx / DC Orders ED Discharge Orders         Ordered    ipratropium (ATROVENT) 0.03 % nasal spray  3 times daily PRN     04/13/19 1605    acetaminophen (TYLENOL) 500 MG tablet  Every 6 hours PRN     04/13/19 1605           Renita Papa, PA-C 04/13/19 1836     Wyvonnia Dusky, MD 04/13/19 1948

## 2019-04-13 NOTE — Discharge Instructions (Signed)
Your workup today was reassuring. You can take 1 to 2 tablets of Tylenol (350mg -1000mg  depending on the dose) every 6 hours as needed for pain or fever.  Do not exceed 4000 mg of Tylenol daily.  If your pain persists you can take a dose of ibuprofen in between doses of Tylenol.  I usually recommend 400 to 600 mg of ibuprofen every 6 hours.  Take this with food to avoid upset stomach issues.  Drink plenty of fluids and get rest.  Use Atrovent nasal spray for nasal congestion.  Can also use over-the-counter medicines such as Zyrtec or cold and flu medicines.  You can also use humidifiers, nasal saline sprays, cough drops/throat lozenges.  Your Covid test rules out within 24 hours.  You will receive a phone call if the test is positive, no phone call if the test is negative, can also view your results on MyChart.  If your test results are positive you will need to quarantine at home for at least 10 days from when your symptoms began.  Follow-up with your PCP for reevaluation of your symptoms.  Can also follow-up with the general surgeons for reevaluation of your recurrent armpit abscesses but this can happen on an elective basis.  You can continue to apply warm compresses to the area which may continue to drain for the next couple of days.  You can wash gently with Dial antibacterial or Dove soap.  Return to the emergency department if any concerning signs or symptoms develop.

## 2019-05-19 ENCOUNTER — Ambulatory Visit: Payer: 59 | Attending: Internal Medicine

## 2019-05-19 DIAGNOSIS — Z20822 Contact with and (suspected) exposure to covid-19: Secondary | ICD-10-CM

## 2019-05-20 LAB — SARS-COV-2, NAA 2 DAY TAT

## 2019-05-20 LAB — NOVEL CORONAVIRUS, NAA: SARS-CoV-2, NAA: NOT DETECTED

## 2019-06-18 ENCOUNTER — Other Ambulatory Visit: Payer: Self-pay

## 2019-06-18 ENCOUNTER — Encounter: Payer: Self-pay | Admitting: Family Medicine

## 2019-06-18 ENCOUNTER — Ambulatory Visit (INDEPENDENT_AMBULATORY_CARE_PROVIDER_SITE_OTHER): Payer: 59 | Admitting: Family Medicine

## 2019-06-18 ENCOUNTER — Ambulatory Visit (INDEPENDENT_AMBULATORY_CARE_PROVIDER_SITE_OTHER): Payer: 59

## 2019-06-18 DIAGNOSIS — M79672 Pain in left foot: Secondary | ICD-10-CM

## 2019-06-18 DIAGNOSIS — M238X1 Other internal derangements of right knee: Secondary | ICD-10-CM

## 2019-06-18 DIAGNOSIS — M238X2 Other internal derangements of left knee: Secondary | ICD-10-CM

## 2019-06-18 NOTE — Progress Notes (Signed)
Office Visit Note   Patient: Diane Patel           Date of Birth: 07/21/1980           MRN: [id number] Visit Date: 06/18/2019 Requested by: Simona Huh, NP [place],   [id number] PCP: Simona Huh, NP  Subjective: Chief Complaint  Patient presents with  . Left Foot - Pain    Pain top of foot with certain movements and standing too long. Started 1 week ago, after hitting toes of foot against an uneven part of the cement walkway [place].  . "crunching" in both knees - no pain    HPI: She is here with left foot pain.  About a week ago she jammed her foot on an uneven surface walking on a sidewalk.  She did not fall, but she felt immediate pain in the foot, primarily in the big toe.  She was able to continue walking but she still has pain, and is starting to have some pain on the anterior aspect of the ankle when she dorsiflexes her foot.  No previous problems with her foot.  She is also noticed crunching in her knees when she bends and straightens them.  She has no pain in the knees, but she wanted to know what was causing it.                ROS:   All other systems were reviewed and are negative.  Objective: Vital Signs: There were no vitals taken for this visit.  Physical Exam:  General:  Alert and oriented, in no acute distress. Pulm:  Breathing unlabored. Psy:  Normal mood, congruent affect. Skin: No significant bruising. Knees: She has 2+ patellofemoral crepitus bilaterally.  No knee effusions.  Full range of motion of the knees and no tenderness. Left foot: She has some tenderness to palpation of the first and second MTP joints.  She is able to fully flex and extend all of the toes with intact tendon function.  She does have some tenderness proximally along the extensor tendons of the toes near the ankle.  Imaging: XR Foot Complete Left  Result Date: 06/18/2019 X-rays left foot: She has mild DJD at the first MTP joint.  No fracture seen.   She has early degenerative changes in the midfoot, with traction spur at the Achilles insertion on the calcaneus and at the plantar fascia origin.   Assessment & Plan: 1.  1 week status post left foot contusion with strain of toe extensor tendons -Weightbearing as tolerated, follow-up as needed.  2.  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 -Straight leg raises for strengthening, avoid excessive stair climbing, squatting and kneeling. -Glucosamine sulfate for maintenance. -Weight loss would also help. -Follow-up as needed.  3.  She also plans to make an appointment in the near future to discuss some back pain she is having, as well as weight loss strategies.     Procedures: No procedures performed  No notes on file     PMFS History: Patient Active Problem List   Diagnosis Date Noted  . Precordial pain 05/21/2018  . Essential hypertension 04/15/2018  . Obesity hypoventilation syndrome ([place]) 04/27/2014  . Morbid obesity ([place]) 04/27/2014  . Nocturia more than twice per night 04/27/2014  . Sleep related headaches 04/27/2014  . 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 OSA (obstructive sleep apnea) 09/14/2013  . Upper airway cough syndrome 07/27/2013  . SOB (shortness of breath) 07/09/2013  . Family history of systemic lupus  erythematosus 07/09/2013  . Left arm pain 06/21/2013  . 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 Edema 06/21/2013  . Anemia 12/22/2012  . Screening cholesterol level 12/22/2012  . Migraine 12/21/2012  . Asthma 07/13/2010  . MENORRHAGIA 03/30/2010  . ECZEMA 03/30/2010  . DYSPEPSIA 09/05/2009  . POLYCYSTIC OVARIAN DISEASE 09/04/2009  . OBESITY 09/04/2009  . DEPRESSION 09/04/2009  . GERD 09/04/2009  . FATIGUE 09/04/2009  . Atypical chest pain 09/04/2009   Past Medical History:  Diagnosis Date  . Allergy   . Anemia   . Anxiety   . Asthma   . Back pain    resolved, no current problems per patient 11/25/18  . Chest pain    no current problems  per pt 11/25/18  . Depression   . Essential hypertension 04/15/2018  . GERD (gastroesophageal reflux disease)   . Headaches, cluster    resolved per patient 11/25/18, no longer a problem  . Hyperlipidemia   . Obesity, morbid ([place])   . OSA (obstructive sleep apnea) 01/2014   Does not use CPAP  . PCOS (polycystic ovarian syndrome)   . Pre-diabetes 11/2018   diet controlled/exercise, no meds, does not check sugar    Family History  Problem Relation Age of Onset  . Cancer Maternal Grandmother        ? stomach cancer  . Diabetes Maternal Grandmother   . Hypertension Mother   . Diabetes Mother   . Heart disease Mother   . Polycystic ovary syndrome Mother   . Cancer - Ovarian Mother   . Diabetes Father   . Hypertension Father   . Emphysema Father        smoked  . Diabetes Sister   . Pancreatitis Sister   . Cancer Maternal Grandfather        bone  . Glaucoma Maternal Grandfather   . Osteoporosis Maternal Grandfather   . Hypertension Paternal Grandfather   . Mental illness Paternal Grandfather   . Asthma Brother     Past Surgical History:  Procedure Laterality Date  . APPENDECTOMY  2004  . CHOLECYSTECTOMY  2010  . CHROMOPERTUBATION N/A 11/26/2018   Procedure: CHROMOPERTUBATION;  Surgeon: Sanjuana Kava, MD;  Location: [facility];  Service: Gynecology;  Laterality: N/A;  . COLONOSCOPY     polyps  . DILATATION & CURRETTAGE/HYSTEROSCOPY WITH RESECTOCOPE N/A 11/26/2018   Procedure: DILATATION & CURETTAGE/DIAGNOSTIC HYSTEROSCOPY WITH POLYPECTOMY;  Surgeon: Sanjuana Kava, MD;  Location: [facility];  Service: Gynecology;  Laterality: N/A;  . LAPAROSCOPY N/A 11/26/2018   Procedure: LAPAROSCOPY OPERATIVE;  Surgeon: Sanjuana Kava, MD;  Location: [facility];  Service: Gynecology;  Laterality: N/A;  . TONSILLECTOMY AND ADENOIDECTOMY  1985  . UPPER GI ENDOSCOPY    . WISDOM TOOTH EXTRACTION     Social History   Occupational History  . Occupation: unemployed  Tobacco Use  . Smoking status: Never Smoker    . Smokeless tobacco: Never Used  Substance and Sexual Activity  . Alcohol use: No  . Drug use: No  . Sexual activity: Yes    Birth control/protection: None

## 2019-06-22 ENCOUNTER — Ambulatory Visit: Payer: 59 | Admitting: Family Medicine

## 2019-06-25 ENCOUNTER — Ambulatory Visit (INDEPENDENT_AMBULATORY_CARE_PROVIDER_SITE_OTHER): Payer: 59

## 2019-06-25 ENCOUNTER — Other Ambulatory Visit: Payer: Self-pay

## 2019-06-25 ENCOUNTER — Ambulatory Visit (INDEPENDENT_AMBULATORY_CARE_PROVIDER_SITE_OTHER): Payer: 59 | Admitting: Family Medicine

## 2019-06-25 ENCOUNTER — Encounter: Payer: Self-pay | Admitting: Family Medicine

## 2019-06-25 DIAGNOSIS — M5441 Lumbago with sciatica, right side: Secondary | ICD-10-CM

## 2019-06-25 DIAGNOSIS — M5442 Lumbago with sciatica, left side: Secondary | ICD-10-CM

## 2019-06-25 DIAGNOSIS — G8929 Other chronic pain: Secondary | ICD-10-CM | POA: Diagnosis not present

## 2019-06-25 DIAGNOSIS — M542 Cervicalgia: Secondary | ICD-10-CM | POA: Diagnosis not present

## 2019-06-25 MED ORDER — GRISEOFULVIN MICROSIZE 500 MG PO TABS: 500.0000 mg | ORAL_TABLET | Freq: Every day | ORAL | 2 refills | Status: DC

## 2019-06-25 MED ORDER — NABUMETONE 750 MG PO TABS
750.0000 mg | ORAL_TABLET | Freq: Two times a day (BID) | ORAL | 6 refills | Status: DC | PRN
Start: 2019-06-25 — End: 2019-09-17

## 2019-06-25 MED ORDER — BACLOFEN 10 MG PO TABS: 5.0000 mg | ORAL_TABLET | Freq: Three times a day (TID) | ORAL | 3 refills | Status: DC | PRN
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
## 2019-09-03 ENCOUNTER — Ambulatory Visit: Payer: No Typology Code available for payment source | Attending: Internal Medicine

## 2019-09-03 DIAGNOSIS — Z20822 Contact with and (suspected) exposure to covid-19: Secondary | ICD-10-CM

## 2019-09-04 LAB — SARS-COV-2, NAA 2 DAY TAT

## 2019-09-04 LAB — NOVEL CORONAVIRUS, NAA: SARS-CoV-2, NAA: NOT DETECTED

## 2019-09-17 ENCOUNTER — Emergency Department (HOSPITAL_COMMUNITY)
Admission: EM | Admit: 2019-09-17 | Discharge: 2019-09-17 | Disposition: A | Discharge status: Home / Self Care | Payer: No Typology Code available for payment source | Attending: Emergency Medicine | Admitting: Emergency Medicine

## 2019-09-17 ENCOUNTER — Encounter (HOSPITAL_COMMUNITY): Payer: Self-pay | Admitting: Emergency Medicine

## 2019-09-17 ENCOUNTER — Emergency Department (HOSPITAL_COMMUNITY): Payer: No Typology Code available for payment source

## 2019-09-17 ENCOUNTER — Other Ambulatory Visit: Payer: Self-pay

## 2019-09-17 DIAGNOSIS — R42 Dizziness and giddiness: Secondary | ICD-10-CM | POA: Diagnosis not present

## 2019-09-17 DIAGNOSIS — Z79899 Other long term (current) drug therapy: Secondary | ICD-10-CM | POA: Insufficient documentation

## 2019-09-17 DIAGNOSIS — R0789 Other chest pain: Secondary | ICD-10-CM | POA: Diagnosis not present

## 2019-09-17 DIAGNOSIS — R7303 Prediabetes: Secondary | ICD-10-CM | POA: Diagnosis not present

## 2019-09-17 DIAGNOSIS — J45909 Unspecified asthma, uncomplicated: Secondary | ICD-10-CM | POA: Insufficient documentation

## 2019-09-17 DIAGNOSIS — R519 Headache, unspecified: Secondary | ICD-10-CM | POA: Diagnosis not present

## 2019-09-17 DIAGNOSIS — Z7951 Long term (current) use of inhaled steroids: Secondary | ICD-10-CM | POA: Insufficient documentation

## 2019-09-17 DIAGNOSIS — I10 Essential (primary) hypertension: Secondary | ICD-10-CM | POA: Diagnosis present

## 2019-09-17 DIAGNOSIS — Z7984 Long term (current) use of oral hypoglycemic drugs: Secondary | ICD-10-CM | POA: Insufficient documentation

## 2019-09-17 LAB — COMPREHENSIVE METABOLIC PANEL
ALT: 16 U/L (ref 0–44)
AST: 16 U/L (ref 15–41)
Albumin: 3.7 g/dL (ref 3.5–5.0)
Alkaline Phosphatase: 60 U/L (ref 38–126)
Anion gap: 12 (ref 5–15)
BUN: 11 mg/dL (ref 6–20)
CO2: 22 mmol/L (ref 22–32)
Calcium: 8.8 mg/dL — ABNORMAL LOW (ref 8.9–10.3)
Chloride: 106 mmol/L (ref 98–111)
Creatinine, Ser: 0.7 mg/dL (ref 0.44–1.00)
GFR calc Af Amer: >60 mL/min (ref >60)
GFR calc non Af Amer: >60 mL/min (ref >60)
Glucose, Bld: 107 mg/dL — ABNORMAL HIGH (ref 70–99)
Potassium: 3.8 mmol/L (ref 3.5–5.1)
Sodium: 140 mmol/L (ref 135–145)
Total Bilirubin: 0.8 mg/dL (ref 0.3–1.2)
Total Protein: 6.7 g/dL (ref 6.5–8.1)

## 2019-09-17 LAB — URINALYSIS, ROUTINE W REFLEX MICROSCOPIC
Bilirubin Urine: NEGATIVE
Glucose, UA: NEGATIVE mg/dL
Hgb urine dipstick: NEGATIVE
Ketones, ur: NEGATIVE mg/dL
Leukocytes,Ua: NEGATIVE
Nitrite: NEGATIVE
Protein, ur: NEGATIVE mg/dL
Specific Gravity, Urine: 1.006 (ref 1.005–1.030)
pH: 6 (ref 5.0–8.0)

## 2019-09-17 LAB — CBC
HCT: 37.7 % (ref 36.0–46.0)
Hemoglobin: 11.7 g/dL — ABNORMAL LOW (ref 12.0–15.0)
MCH: 24.9 pg — ABNORMAL LOW (ref 26.0–34.0)
MCHC: 31 g/dL (ref 30.0–36.0)
MCV: 80.2 fL (ref 80.0–100.0)
Platelets: 307 10*3/uL (ref 150–400)
RBC: 4.7 MIL/uL (ref 3.87–5.11)
RDW: 15.6 % — ABNORMAL HIGH (ref 11.5–15.5)
WBC: 7.4 10*3/uL (ref 4.0–10.5)
nRBC: 0 % (ref 0.0–0.2)

## 2019-09-17 LAB — HCG, QUANTITATIVE, PREGNANCY: hCG, Beta Chain, Quant, S: <1 m[IU]/mL (ref <5)

## 2019-09-17 LAB — TROPONIN I (HIGH SENSITIVITY)
Troponin I (High Sensitivity): <2 ng/L (ref <18)
Troponin I (High Sensitivity): <2 ng/L (ref <18)

## 2019-09-17 LAB — LIPASE, BLOOD: Lipase: 27 U/L (ref 11–51)

## 2019-09-17 MED ORDER — HYDRALAZINE HCL 25 MG PO TABS
25.0000 mg | ORAL_TABLET | Freq: Once | ORAL | Status: AC
  Administered 2019-09-17: 25 mg via ORAL
  Filled 2019-09-17: qty 1

## 2019-09-17 MED ORDER — HYDRALAZINE HCL 25 MG PO TABS
25.0000 mg | ORAL_TABLET | Freq: Two times a day (BID) | ORAL | 0 refills | Status: DC
Start: 2019-09-17 — End: 2019-10-15

## 2019-09-17 NOTE — ED Notes (Signed)
Save blue tube in main lab °

## 2019-09-17 NOTE — Discharge Instructions (Signed)
Your evaluation today was very reassuring.  Please begin taking blood pressure medication twice daily as directed and follow-up closely with your primary care doctor and cardiologist, if this medication is not working well for you you can discuss other medications.  I would like for you to check your blood pressure and record it twice daily in the morning and late afternoon, this will help your doctor know if they need to adjust your medications.  Return to the ED for any new or worsening symptoms.

## 2019-09-17 NOTE — ED Triage Notes (Signed)
Pt woke up this morning with reports of dizziness, weakness, and pressure in chest. Concerned about possible MI and abnormal high blood pressure. Pt reports baseline systolic is normally [id number] 138.

## 2019-09-17 NOTE — ED Provider Notes (Signed)
[facility] DEPT Provider Note   CSN: [id number] Arrival date & time: 09/17/19  0516     History Chief Complaint  Patient presents with  . Hypertension  . Chest Pain    Diane Patel is a 39 y.o. female.  Diane Patel is a 39 y.o. female with a history of obesity, hypertension, GERD, sleep apnea, prediabetes, PCOS, asthma, anxiety, who presents to the emergency department for evaluation of high blood pressure.  Patient states that for the past few days her blood pressure has been elevated, as high as 170/100, she states that yesterday she was having some chest pressure.  She chalked it up to having a very busy and stressful day but when she got home she was not sleeping well and woke up with a headache, somewhat disoriented and lightheaded.  She reports that this morning she had another episode of chest pressure and noted that her blood pressure was still elevated and got concerned.  She states that her headache has eased off but is not completely gone.  She denies any associated visual changes, nausea, vomiting, dizziness,.  She denies any associated shortness of breath.  No lower extremity swelling.  She states that last March she was referred to a cardiologist by her OB/GYN and was started on hydralazine for high blood pressure.  She states that she thought that her blood pressure was elevated more so because of her pain from PCOS, she felt like the medication was too strong, they reduce the dose to half, but then she was feeling better so stopped taking it and has not followed up her cardiologist, Dr. Virgina Jock.        Past Medical History:  Diagnosis Date  . Allergy   . Anemia   . Anxiety   . Asthma   . Back pain    resolved, no current problems per patient 11/25/18  . Chest pain    no current problems per pt 11/25/18  . Depression   . Essential hypertension 04/15/2018  . GERD (gastroesophageal reflux disease)   . Headaches, cluster     resolved per patient 11/25/18, no longer a problem  . Hyperlipidemia   . Obesity, morbid ([place])   . OSA (obstructive sleep apnea) 01/2014   Does not use CPAP  . PCOS (polycystic ovarian syndrome)   . Pre-diabetes 11/2018   diet controlled/exercise, no meds, does not check sugar    Patient Active Problem List   Diagnosis Date Noted  . Precordial pain 05/21/2018  . Essential hypertension 04/15/2018  . Obesity hypoventilation syndrome ([place]) 04/27/2014  . Morbid obesity ([place]) 04/27/2014  . Nocturia more than twice per night 04/27/2014  . Sleep related headaches 04/27/2014  . 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 OSA (obstructive sleep apnea) 09/14/2013  . Upper airway cough syndrome 07/27/2013  . SOB (shortness of breath) 07/09/2013  . Family history of systemic lupus erythematosus 07/09/2013  . Left arm pain 06/21/2013  . 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 Edema 06/21/2013  . Anemia 12/22/2012  . Screening cholesterol level 12/22/2012  . Migraine 12/21/2012  . Asthma 07/13/2010  . MENORRHAGIA 03/30/2010  . ECZEMA 03/30/2010  . DYSPEPSIA 09/05/2009  . POLYCYSTIC OVARIAN DISEASE 09/04/2009  . OBESITY 09/04/2009  . DEPRESSION 09/04/2009  . GERD 09/04/2009  . FATIGUE 09/04/2009  . Atypical chest pain 09/04/2009    Past Surgical History:  Procedure Laterality Date  . APPENDECTOMY  2004  . CHOLECYSTECTOMY  2010  . CHROMOPERTUBATION N/A 11/26/2018   Procedure: CHROMOPERTUBATION;  Surgeon:  Sanjuana Kava, MD;  Location: [facility];  Service: Gynecology;  Laterality: N/A;  . COLONOSCOPY     polyps  . DILATATION & CURRETTAGE/HYSTEROSCOPY WITH RESECTOCOPE N/A 11/26/2018   Procedure: DILATATION & CURETTAGE/DIAGNOSTIC HYSTEROSCOPY WITH POLYPECTOMY;  Surgeon: Sanjuana Kava, MD;  Location: [facility];  Service: Gynecology;  Laterality: N/A;  . LAPAROSCOPY N/A 11/26/2018   Procedure: LAPAROSCOPY OPERATIVE;  Surgeon: Sanjuana Kava, MD;  Location: [facility];  Service: Gynecology;  Laterality: N/A;  . TONSILLECTOMY  AND ADENOIDECTOMY  1985  . UPPER GI ENDOSCOPY    . WISDOM TOOTH EXTRACTION       OB History   No obstetric history on file.     Family History  Problem Relation Age of Onset  . Cancer Maternal Grandmother        ? stomach cancer  . Diabetes Maternal Grandmother   . Hypertension Mother   . Diabetes Mother   . Heart disease Mother   . Polycystic ovary syndrome Mother   . Cancer - Ovarian Mother   . Diabetes Father   . Hypertension Father   . Emphysema Father        smoked  . Diabetes Sister   . Pancreatitis Sister   . Cancer Maternal Grandfather        bone  . Glaucoma Maternal Grandfather   . Osteoporosis Maternal Grandfather   . Hypertension Paternal Grandfather   . Mental illness Paternal Grandfather   . Asthma Brother     Social History   Tobacco Use  . Smoking status: Never Smoker  . Smokeless tobacco: Never Used  Vaping Use  . Vaping Use: Never used  Substance Use Topics  . Alcohol use: No  . Drug use: No    Home Medications Prior to Admission medications   Medication Sig Start Date End Date Taking? Authorizing Provider  acetaminophen (TYLENOL) 500 MG tablet Take 1 tablet (500 mg total) by mouth every 6 (six) hours as needed. Patient taking differently: Take 500-1,000 mg by mouth every 6 (six) hours as needed for mild pain or headache.  04/13/19  Yes Fawze, Mina A, PA-C  albuterol (PROVENTIL HFA;VENTOLIN HFA) 108 (90 Base) MCG/ACT inhaler Inhale 1-2 puffs into the lungs every 6 (six) hours as needed for wheezing or shortness of breath. 04/18/17  Yes Khatri, Hina, PA-C  Ascorbic Acid (VITAMIN C PO) Take 1 tablet by mouth daily.   Yes [provider]  baclofen (LIORESAL) 10 MG tablet Take 0.5-1 tablets (5-10 mg total) by mouth 3 (three) times daily as needed for muscle spasms. 06/25/19  Yes Hilts, Legrand Como, MD  clindamycin (CLEOCIN T) 1 % external solution Apply 1 application topically at bedtime. 08/16/19  Yes [provider]  Fluocinolone  Acetonide Body 0.01 % OIL Apply 1 application topically once a week. 08/16/19  Yes [provider]  metFORMIN (GLUCOPHAGE) 500 MG tablet Take 500 mg by mouth 2 (two) times daily with a meal.    Yes [provider]  Multiple Vitamins-Minerals (MULTIVITAMIN WITH MINERALS) tablet Take 1 tablet by mouth daily.   Yes [provider]  pantoprazole (PROTONIX) 40 MG tablet Take 40 mg by mouth daily.   Yes [provider]  triamcinolone ointment (KENALOG) 0.1 % Apply 1 application topically 2 (two) times daily. 08/16/19  Yes [provider]  Vitamin D, Ergocalciferol, (DRISDOL) 1.25 MG (50000 UNIT) CAPS capsule Take 50,000 Units by mouth once a week. 05/05/19  Yes [provider]  hydrALAZINE (APRESOLINE) 25 MG tablet Take 1  tablet (25 mg total) by mouth 2 (two) times daily. 09/17/19   Jacqlyn Larsen, PA-C    Allergies    Ace inhibitors, Pineapple, Flexeril [cyclobenzaprine], Lactose intolerance (gi), Chocolate, Cocoa, and Naproxen  Review of Systems   Review of Systems  Constitutional: Negative for chills and fever.  HENT: Negative.   Eyes: Negative for visual disturbance.  Respiratory: Negative for cough and shortness of breath.   Cardiovascular: Positive for chest pain.  Gastrointestinal: Negative for abdominal pain, nausea and vomiting.  Genitourinary: Negative for dysuria and frequency.  Musculoskeletal: Negative for arthralgias and myalgias.  Skin: Negative for color change and rash.  Neurological: Positive for dizziness, light-headedness and headaches. Negative for seizures, syncope, weakness and numbness.    Physical Exam Updated Vital Signs BP (!) 140/114 (BP Location: Right Arm)   Pulse 70   Temp 98.3 F (36.8 C) (Oral)   Resp 20   Ht 5' 2.5" (1.588 m)   Wt (!) 139.3 kg   LMP 08/27/2019   SpO2 98%   BMI 55.26 kg/m   Physical Exam Vitals and nursing note reviewed.  Constitutional:      General: She is not in acute  distress.    Appearance: She is well-developed. She is obese. She is not ill-appearing or diaphoretic.     Comments: Patient appears somewhat anxious, but overall well-appearing and in no distress  HENT:     Head: Normocephalic and atraumatic.  Eyes:     General:        Right eye: No discharge.        Left eye: No discharge.     Extraocular Movements: Extraocular movements intact.     Pupils: Pupils are equal, round, and reactive to light.  Cardiovascular:     Rate and Rhythm: Normal rate and regular rhythm.     Pulses:          Radial pulses are 2+ on the right side and 2+ on the left side.       Dorsalis pedis pulses are 2+ on the right side and 2+ on the left side.     Heart sounds: Normal heart sounds. No murmur heard.  No friction rub. No gallop.   Pulmonary:     Effort: Pulmonary effort is normal. No respiratory distress.     Breath sounds: Normal breath sounds. No wheezing or rales.     Comments: Respirations equal and unlabored, patient able to speak in full sentences, lungs clear to auscultation bilaterally Chest:     Chest wall: No tenderness.  Abdominal:     General: Bowel sounds are normal. There is no distension.     Palpations: Abdomen is soft. There is no mass.     Tenderness: There is no abdominal tenderness. There is no guarding.     Comments: Abdomen soft, nondistended, nontender to palpation in all quadrants without guarding or peritoneal signs  Musculoskeletal:        General: No deformity.     Cervical back: Neck supple.     Right lower leg: No tenderness. No edema.     Left lower leg: No tenderness. No edema.  Skin:    General: Skin is warm and dry.     Capillary Refill: Capillary refill takes less than 2 seconds.  Neurological:     General: No focal deficit present.     Mental Status: She is alert.     Coordination: Coordination normal.     Comments: Speech is clear, able to  follow commands CN III-XII intact Normal strength in upper and lower  extremities bilaterally including dorsiflexion and plantar flexion, strong and equal grip strength Sensation normal to light and sharp touch Moves extremities without ataxia, coordination intact Normal finger-to-nose testing, no pronator drift  Psychiatric:        Mood and Affect: Mood normal.        Behavior: Behavior normal.     ED Results / Procedures / Treatments   Labs (all labs ordered are listed, but only abnormal results are displayed) Labs Reviewed  COMPREHENSIVE METABOLIC PANEL - Abnormal; Notable for the following components:      Result Value   Glucose, Bld 107 (*)    Calcium 8.8 (*)    All other components within normal limits  CBC - Abnormal; Notable for the following components:   Hemoglobin 11.7 (*)    MCH 24.9 (*)    RDW 15.6 (*)    All other components within normal limits  URINALYSIS, ROUTINE W REFLEX MICROSCOPIC - Abnormal; Notable for the following components:   Color, Urine STRAW (*)    All other components within normal limits  LIPASE, BLOOD  HCG, QUANTITATIVE, PREGNANCY  TROPONIN I (HIGH SENSITIVITY)  TROPONIN I (HIGH SENSITIVITY)    EKG EKG Interpretation  Date/Time:  Friday September 17 2019 11:15:01 EDT Ventricular Rate:  61 PR Interval:    QRS Duration: 94 QT Interval:  431 QTC Calculation: 435 R Axis:   38 Text Interpretation: Sinus rhythm Low voltage, precordial leads Confirmed by Ripley Fraise [phone]) on 09/18/2019 11:41:25 AM   Radiology DG Chest Port 1 View  Result Date: 09/17/2019 CLINICAL DATA:  Chest pain EXAM: PORTABLE CHEST 1 VIEW COMPARISON:  April 13, 2019 FINDINGS: The cardiomediastinal silhouette is unchanged in contour.RIGHT-sided pericardial fat. No pleural effusion. No pneumothorax. No acute pleuroparenchymal abnormality. Visualized abdomen is unremarkable. No acute osseous abnormality. IMPRESSION: No acute cardiopulmonary abnormality. Electronically Signed   By: Valentino Saxon MD   On: 09/17/2019 11:09     Procedures Procedures (including critical care time)  Medications Ordered in ED Medications  hydrALAZINE (APRESOLINE) tablet 25 mg (25 mg Oral Given 09/17/19 1204)    ED Course  I have reviewed the triage vital signs and the nursing notes.  Pertinent labs & imaging results that were available during my care of the patient were reviewed by me and considered in my medical decision making (see chart for details).    MDM Rules/Calculators/A&P                          39 year old female presents for evaluation of elevated blood pressure, she states it has been as high as 170/110 at home, and she has had associated headaches and some chest pressure.  On arrival patient's blood pressure is 140/114 all other vitals normal.  She still has a mild headache, currently denies chest pressure or shortness of breath.  Has a history of hypertension, but states that she did not really except that this was a problem of hers and so stopped taking her hydralazine many months ago, over the past few weeks she has noticed that her blood pressure has been persistently elevated, and the symptoms she is experienced over the past few days she thinks may be related to this.  Will check basic labs, troponin, chest x-ray and EKG.  Patient has normal neurologic exam, no associated dizziness or other neurologic deficits, do not feel she needs head imaging.  I have independently ordered, reviewed and interpreted all labs and imaging: CBC: No leukocytosis, stable hemoglobin CMP: No acute electrolyte derangements, normal renal and liver function Troponin: Negative x2 Preg Test: Neg EKG : Sinus Rhythm without ischemic changes CXR: No acute cardiopulmonary disease.  Patient's work-up has been very reassuring and on reevaluation her blood pressure has significantly improved with treatment with hydralazine and she has had complete resolution of her headache, had a long discussion with patient about the importance of  treating high blood pressure and she does understand that this is a problem she has now and will need to take medication regularly for it.  Will represcribe patient hydralazine 25 mg twice daily that she was previously taking as it certainly seems to have helped her blood pressure today and we will have her follow-up closely with her PCP and cardiologist.  I have asked patient to keep a blood pressure log.  Return precautions discussed.  Stable for discharge home. BP 130/86   Pulse 91   Temp 98.3 F (36.8 C) (Oral)   Resp (!) 26   Ht 5' 2.5" (1.588 m)   Wt (!) 139.3 kg   LMP 08/27/2019   SpO2 99%   BMI 55.26 kg/m   Final Clinical Impression(s) / ED Diagnoses Final diagnoses:  Hypertension, unspecified type  Chest pressure    Rx / DC Orders ED Discharge Orders         Ordered    hydrALAZINE (APRESOLINE) 25 MG tablet  2 times daily     Discontinue  Reprint     09/17/19 [place], Aunesti Pellegrino N, [place] 09/19/19 2150    Charlesetta Shanks, MD 10/06/19 1511

## 2019-10-14 NOTE — Progress Notes (Signed)
Subjective:   Diane Patel, female    DOB: [birth year], 39 y.o.   MRN: [id number]    Chief complaint:  Chest pain   HPI  39 y.o. African American female with obesity, atypical chest pain, hypertension  Patient was seen in [facility] emergency department on 09/17/2019 with complaints of hypertension and chest pain.  EKG, troponin, ruled out ACS.  Patient is here for follow-up today.  She is very emotional about acceptance of her diagnosis of hypertension.  She is been taking hydralazine 25 mg twice daily, but is not on any other antihypertensive agent.    Current Outpatient Medications on File Prior to Visit  Medication Sig Dispense Refill  . acetaminophen (TYLENOL) 500 MG tablet Take 1 tablet (500 mg total) by mouth every 6 (six) hours as needed. (Patient taking differently: Take 500-1,000 mg by mouth every 6 (six) hours as needed for mild pain or headache. ) 30 tablet 0  . albuterol (PROVENTIL HFA;VENTOLIN HFA) 108 (90 Base) MCG/ACT inhaler Inhale 1-2 puffs into the lungs every 6 (six) hours as needed for wheezing or shortness of breath. 1 Inhaler 0  . Ascorbic Acid (VITAMIN C PO) Take 1 tablet by mouth daily.    . baclofen (LIORESAL) 10 MG tablet Take 0.5-1 tablets (5-10 mg total) by mouth 3 (three) times daily as needed for muscle spasms. 30 each 3  . clindamycin (CLEOCIN T) 1 % external solution Apply 1 application topically at bedtime.    . Fluocinolone Acetonide Body 0.01 % OIL Apply 1 application topically once a week.    . hydrALAZINE (APRESOLINE) 25 MG tablet Take 1 tablet (25 mg total) by mouth 2 (two) times daily. 60 tablet 0  . metFORMIN (GLUCOPHAGE) 500 MG tablet Take 500 mg by mouth 2 (two) times daily with a meal.     . Multiple Vitamins-Minerals (MULTIVITAMIN WITH MINERALS) tablet Take 1 tablet by mouth daily.    . pantoprazole (PROTONIX) 40 MG tablet Take 40 mg by mouth daily.    Marland Kitchen triamcinolone ointment (KENALOG) 0.1 % Apply 1 application topically 2 (two)  times daily.    . Vitamin D, Ergocalciferol, (DRISDOL) 1.25 MG (50000 UNIT) CAPS capsule Take 50,000 Units by mouth once a week.     No current facility-administered medications on file prior to visit.    Cardiovascular studies:  EKG 03/04/18: Normal sinus rhythm. Normal ECG.  Echocardiogram (Outside) 06/30/2013: Left ventricle: The cavity size was normal. Wall thickness wasincreased in a pattern of mild LVH. Systolic function was normal. The estimated ejection fraction was in the range of 55% to 60%.Wall motion was normal; there were no regional wall motionabnormalities. Left ventricular diastolic function parameterswere normal.  Sleep Study 2015: OSA on CPAP.  Recent labs: 09/17/2019: Glucose 107, BUN/Cr 11/0.7. EGFR >60. Na/K 140/3.8. Rest of the CMP normal Trop HS <2, <2 H/H 11.7/37.7. MCV 80. Platelets 307   04/2018: Chol 178, TG 63, HDL 42, LDL 123    Review of Systems  Cardiovascular: Negative for chest pain, dyspnea on exertion, leg swelling, palpitations and syncope.         Vitals:   10/15/19 1353  BP: (!) 140/93  Pulse: 81  Resp: 17  SpO2: 99%     Objective:    Physical Exam Vitals and nursing note reviewed.  Constitutional:      General: She is not in acute distress. Neck:     Vascular: No JVD.  Cardiovascular:     Rate and Rhythm: Normal  rate and regular rhythm.     Heart sounds: Normal heart sounds. No murmur heard.   Pulmonary:     Effort: Pulmonary effort is normal.     Breath sounds: Normal breath sounds. No wheezing or rales.           Assessment & Recommendations:   39 y.o. African American female with obesity, atypical chest pain, hypertension  Hypertension: Recommend addition of amlodipine 5 mg daily. In future, should she consider infertility treatment, could consider switching amlodipine to labetalol/nifedipine.  Chest pain: Still persists. Pain is atypical, and only at rest.  Recommend management of hypertension at  this time.  If pain continues, could consider ischemia testing down the road.  Check lipid panel   F/u in 4 weeks   Manish J Patwardhan, MD [facility] Pager: [phone] Office: [phone] If no answer Cell [phone]    

## 2019-10-15 ENCOUNTER — Ambulatory Visit: Payer: No Typology Code available for payment source | Admitting: Cardiology

## 2019-10-15 ENCOUNTER — Encounter: Payer: Self-pay | Admitting: Cardiology

## 2019-10-15 ENCOUNTER — Other Ambulatory Visit: Payer: Self-pay

## 2019-10-15 VITALS — BP 140/93 | HR 81 | Resp 17 | Ht 62.0 in | Wt 307.0 lb

## 2019-10-15 DIAGNOSIS — Z13220 Encounter for screening for lipoid disorders: Secondary | ICD-10-CM

## 2019-10-15 DIAGNOSIS — I10 Essential (primary) hypertension: Secondary | ICD-10-CM

## 2019-10-15 MED ORDER — AMLODIPINE BESYLATE 5 MG PO TABS: 5.0000 mg | ORAL_TABLET | Freq: Every day | ORAL | 3 refills | Status: DC

## 2019-10-15 NOTE — Patient Instructions (Signed)
Hypertension, Adult Hypertension is another name for high blood pressure. High blood pressure forces your heart to work harder to pump blood. This can cause problems over time. There are two numbers in a blood pressure reading. There is a top number (systolic) over a bottom number (diastolic). It is best to have a blood pressure that is below 120/80. Healthy choices can help lower your blood pressure, or you may need medicine to help lower it. What are the causes? The cause of this condition is not known. Some conditions may be related to high blood pressure. What increases the risk?  Smoking.  Having type 2 diabetes mellitus, high cholesterol, or both.  Not getting enough exercise or physical activity.  Being overweight.  Having too much fat, sugar, calories, or salt (sodium) in your diet.  Drinking too much alcohol.  Having long-term (chronic) kidney disease.  Having a family history of high blood pressure.  Age. Risk increases with age.  Race. You may be at higher risk if you are African American.  Gender. Men are at higher risk than women before age 45. After age 65, women are at higher risk than men.  Having obstructive sleep apnea.  Stress. What are the signs or symptoms?  High blood pressure may not cause symptoms. Very high blood pressure (hypertensive crisis) may cause: ? Headache. ? Feelings of worry or nervousness (anxiety). ? Shortness of breath. ? Nosebleed. ? A feeling of being sick to your stomach (nausea). ? Throwing up (vomiting). ? Changes in how you see. ? Very bad chest pain. ? Seizures. How is this treated?  This condition is treated by making healthy lifestyle changes, such as: ? Eating healthy foods. ? Exercising more. ? Drinking less alcohol.  Your health care provider may prescribe medicine if lifestyle changes are not enough to get your blood pressure under control, and if: ? Your top number is above 130. ? Your bottom number is above  80.  Your personal target blood pressure may vary. Follow these instructions at home: Eating and drinking   If told, follow the DASH eating plan. To follow this plan: ? Fill one half of your plate at each meal with fruits and vegetables. ? Fill one fourth of your plate at each meal with whole grains. Whole grains include whole-wheat pasta, brown rice, and whole-grain bread. ? Eat or drink low-fat dairy products, such as skim milk or low-fat yogurt. ? Fill one fourth of your plate at each meal with low-fat (lean) proteins. Low-fat proteins include fish, chicken without skin, eggs, beans, and tofu. ? Avoid fatty meat, cured and processed meat, or chicken with skin. ? Avoid pre-made or processed food.  Eat less than 1,500 mg of salt each day.  Do not drink alcohol if: ? Your doctor tells you not to drink. ? You are pregnant, may be pregnant, or are planning to become pregnant.  If you drink alcohol: ? Limit how much you use to:  0-1 drink a day for women.  0-2 drinks a day for men. ? Be aware of how much alcohol is in your drink. In the [place] one drink equals one 12 oz bottle of beer (355 mL), one 5 oz glass of wine (148 mL), or one 1 oz glass of hard liquor (44 mL). Lifestyle   Work with your doctor to stay at a healthy weight or to lose weight. Ask your doctor what the best weight is for you.  Get at least 30 minutes of exercise most   days of the week. This may include walking, swimming, or biking.  Get at least 30 minutes of exercise that strengthens your muscles (resistance exercise) at least 3 days a week. This may include lifting weights or doing Pilates.  Do not use any products that contain nicotine or tobacco, such as cigarettes, e-cigarettes, and chewing tobacco. If you need help quitting, ask your doctor.  Check your blood pressure at home as told by your doctor.  Keep all follow-up visits as told by your doctor. This is important. Medicines  Take over-the-counter  and prescription medicines only as told by your doctor. Follow directions carefully.  Do not skip doses of blood pressure medicine. The medicine does not work as well if you skip doses. Skipping doses also puts you at risk for problems.  Ask your doctor about side effects or reactions to medicines that you should watch for. Contact a doctor if you:  Think you are having a reaction to the medicine you are taking.  Have headaches that keep coming back (recurring).  Feel dizzy.  Have swelling in your ankles.  Have trouble with your vision. Get help right away if you:  Get a very bad headache.  Start to feel mixed up (confused).  Feel weak or numb.  Feel faint.  Have very bad pain in your: ? Chest. ? Belly (abdomen).  Throw up more than once.  Have trouble breathing. Summary  Hypertension is another name for high blood pressure.  High blood pressure forces your heart to work harder to pump blood.  For most people, a normal blood pressure is less than 120/80.  Making healthy choices can help lower blood pressure. If your blood pressure does not get lower with healthy choices, you may need to take medicine. This information is not intended to replace advice given to you by your health care provider. Make sure you discuss any questions you have with your health care provider. Document Revised: 10/01/2017 Document Reviewed: 10/01/2017 Elsevier Patient Education  2020 [employer]. DASH Eating Plan DASH stands for "Dietary Approaches to Stop Hypertension." The DASH eating plan is a healthy eating plan that has been shown to reduce high blood pressure (hypertension). It may also reduce your risk for type 2 diabetes, heart disease, and stroke. The DASH eating plan may also help with weight loss. What are tips for following this plan?  General guidelines  Avoid eating more than 2,300 mg (milligrams) of salt (sodium) a day. If you have hypertension, you may need to reduce your  sodium intake to 1,500 mg a day.  Limit alcohol intake to no more than 1 drink a day for nonpregnant women and 2 drinks a day for men. One drink equals 12 oz of beer, 5 oz of wine, or 1 oz of hard liquor.  Work with your health care provider to maintain a healthy body weight or to lose weight. Ask what an ideal weight is for you.  Get at least 30 minutes of exercise that causes your heart to beat faster (aerobic exercise) most days of the week. Activities may include walking, swimming, or biking.  Work with your health care provider or diet and nutrition specialist (dietitian) to adjust your eating plan to your individual calorie needs. Reading food labels   Check food labels for the amount of sodium per serving. Choose foods with less than 5 percent of the Daily Value of sodium. Generally, foods with less than 300 mg of sodium per serving fit into this eating plan.    To find whole grains, look for the word "whole" as the first word in the ingredient list. Shopping  Buy products labeled as "low-sodium" or "no salt added."  Buy fresh foods. Avoid canned foods and premade or frozen meals. Cooking  Avoid adding salt when cooking. Use salt-free seasonings or herbs instead of table salt or sea salt. Check with your health care provider or pharmacist before using salt substitutes.  Do not fry foods. Cook foods using healthy methods such as baking, boiling, grilling, and broiling instead.  Cook with heart-healthy oils, such as olive, canola, soybean, or sunflower oil. Meal planning  Eat a balanced diet that includes: ? 5 or more servings of fruits and vegetables each day. At each meal, try to fill half of your plate with fruits and vegetables. ? Up to 6-8 servings of whole grains each day. ? Less than 6 oz of lean meat, poultry, or fish each day. A 3-oz serving of meat is about the same size as a deck of cards. One egg equals 1 oz. ? 2 servings of low-fat dairy each day. ? A serving of  nuts, seeds, or beans 5 times each week. ? Heart-healthy fats. Healthy fats called Omega-3 fatty acids are found in foods such as flaxseeds and coldwater fish, like sardines, salmon, and mackerel.  Limit how much you eat of the following: ? Canned or prepackaged foods. ? Food that is high in trans fat, such as fried foods. ? Food that is high in saturated fat, such as fatty meat. ? Sweets, desserts, sugary drinks, and other foods with added sugar. ? Full-fat dairy products.  Do not salt foods before eating.  Try to eat at least 2 vegetarian meals each week.  Eat more home-cooked food and less restaurant, buffet, and fast food.  When eating at a restaurant, ask that your food be prepared with less salt or no salt, if possible. What foods are recommended? The items listed may not be a complete list. Talk with your dietitian about what dietary choices are best for you. Grains Whole-grain or whole-wheat bread. Whole-grain or whole-wheat pasta. Brown rice. Oatmeal. Quinoa. Bulgur. Whole-grain and low-sodium cereals. Pita bread. Low-fat, low-sodium crackers. Whole-wheat flour tortillas. Vegetables Fresh or frozen vegetables (raw, steamed, roasted, or grilled). Low-sodium or reduced-sodium tomato and vegetable juice. Low-sodium or reduced-sodium tomato sauce and tomato paste. Low-sodium or reduced-sodium canned vegetables. Fruits All fresh, dried, or frozen fruit. Canned fruit in natural juice (without added sugar). Meat and other protein foods Skinless chicken or turkey. Ground chicken or turkey. Pork with fat trimmed off. Fish and seafood. Egg whites. Dried beans, peas, or lentils. Unsalted nuts, nut butters, and seeds. Unsalted canned beans. Lean cuts of beef with fat trimmed off. Low-sodium, lean deli meat. Dairy Low-fat (1%) or fat-free (skim) milk. Fat-free, low-fat, or reduced-fat cheeses. Nonfat, low-sodium ricotta or cottage cheese. Low-fat or nonfat yogurt. Low-fat, low-sodium  cheese. Fats and oils Soft margarine without trans fats. Vegetable oil. Low-fat, reduced-fat, or light mayonnaise and salad dressings (reduced-sodium). Canola, safflower, olive, soybean, and sunflower oils. Avocado. Seasoning and other foods Herbs. Spices. Seasoning mixes without salt. Unsalted popcorn and pretzels. Fat-free sweets. What foods are not recommended? The items listed may not be a complete list. Talk with your dietitian about what dietary choices are best for you. Grains Baked goods made with fat, such as croissants, muffins, or some breads. Dry pasta or rice meal packs. Vegetables Creamed or fried vegetables. Vegetables in a cheese sauce. Regular canned vegetables (not   low-sodium or reduced-sodium). Regular canned tomato sauce and paste (not low-sodium or reduced-sodium). Regular tomato and vegetable juice (not low-sodium or reduced-sodium). Pickles. Olives. Fruits Canned fruit in a light or heavy syrup. Fried fruit. Fruit in cream or butter sauce. Meat and other protein foods Fatty cuts of meat. Ribs. Fried meat. Bacon. Sausage. Bologna and other processed lunch meats. Salami. Fatback. Hotdogs. Bratwurst. Salted nuts and seeds. Canned beans with added salt. Canned or smoked fish. Whole eggs or egg yolks. Chicken or turkey with skin. Dairy Whole or 2% milk, cream, and half-and-half. Whole or full-fat cream cheese. Whole-fat or sweetened yogurt. Full-fat cheese. Nondairy creamers. Whipped toppings. Processed cheese and cheese spreads. Fats and oils Butter. Stick margarine. Lard. Shortening. Ghee. Bacon fat. Tropical oils, such as coconut, palm kernel, or palm oil. Seasoning and other foods Salted popcorn and pretzels. Onion salt, garlic salt, seasoned salt, table salt, and sea salt. Worcestershire sauce. Tartar sauce. Barbecue sauce. Teriyaki sauce. Soy sauce, including reduced-sodium. Steak sauce. Canned and packaged gravies. Fish sauce. Oyster sauce. Cocktail sauce. Horseradish  that you find on the shelf. Ketchup. Mustard. Meat flavorings and tenderizers. Bouillon cubes. Hot sauce and Tabasco sauce. Premade or packaged marinades. Premade or packaged taco seasonings. Relishes. Regular salad dressings. Where to find more information:  National Heart, Lung, and Blood Institute: www.nhlbi.nih.gov  American Heart Association: www.heart.org Summary  The DASH eating plan is a healthy eating plan that has been shown to reduce high blood pressure (hypertension). It may also reduce your risk for type 2 diabetes, heart disease, and stroke.  With the DASH eating plan, you should limit salt (sodium) intake to 2,300 mg a day. If you have hypertension, you may need to reduce your sodium intake to 1,500 mg a day.  When on the DASH eating plan, aim to eat more fresh fruits and vegetables, whole grains, lean proteins, low-fat dairy, and heart-healthy fats.  Work with your health care provider or diet and nutrition specialist (dietitian) to adjust your eating plan to your individual calorie needs. This information is not intended to replace advice given to you by your health care provider. Make sure you discuss any questions you have with your health care provider. Document Revised: 01/03/2017 Document Reviewed: 01/15/2016 Elsevier Patient Education  2020 Elsevier Inc.  

## 2019-10-18 ENCOUNTER — Encounter: Payer: Self-pay | Admitting: Cardiology

## 2019-10-19 LAB — LIPID PANEL
Chol/HDL Ratio: 4.8 ratio — ABNORMAL HIGH (ref 0.0–4.4)
Cholesterol, Total: 223 mg/dL — ABNORMAL HIGH (ref 100–199)
HDL: 46 mg/dL (ref >39)
LDL Chol Calc (NIH): 164 mg/dL — ABNORMAL HIGH (ref 0–99)
Triglycerides: 75 mg/dL (ref 0–149)
VLDL Cholesterol Cal: 13 mg/dL (ref 5–40)

## 2019-11-16 ENCOUNTER — Ambulatory Visit: Payer: No Typology Code available for payment source | Admitting: Family Medicine

## 2019-11-17 ENCOUNTER — Other Ambulatory Visit: Payer: Self-pay

## 2019-11-17 ENCOUNTER — Encounter: Payer: Self-pay | Admitting: Cardiology

## 2019-11-17 ENCOUNTER — Ambulatory Visit: Payer: No Typology Code available for payment source | Admitting: Cardiology

## 2019-11-17 VITALS — BP 137/92 | HR 101 | Resp 16 | Ht 62.0 in | Wt 310.0 lb

## 2019-11-17 DIAGNOSIS — E782 Mixed hyperlipidemia: Secondary | ICD-10-CM

## 2019-11-17 DIAGNOSIS — I10 Essential (primary) hypertension: Secondary | ICD-10-CM

## 2019-11-17 DIAGNOSIS — R072 Precordial pain: Secondary | ICD-10-CM

## 2019-11-17 NOTE — Progress Notes (Signed)
Subjective:   Diane Patel, female    DOB: Jan 01, 1981, 39 y.o.   MRN: [id number]    Chief complaint:  Chest pain   HPI  39 y.o. African American female with obesity, atypical chest pain, hypertension  Blood pressure has improved since stating amlodipine. She has not had ay recurrent chest pain. Lipid panel reviewed with the patient, details below.   Current Outpatient Medications on File Prior to Visit  Medication Sig Dispense Refill  . acetaminophen (TYLENOL) 500 MG tablet Take 1 tablet (500 mg total) by mouth every 6 (six) hours as needed. (Patient taking differently: Take 500-1,000 mg by mouth every 6 (six) hours as needed for mild pain or headache. ) 30 tablet 0  . albuterol (PROVENTIL HFA;VENTOLIN HFA) 108 (90 Base) MCG/ACT inhaler Inhale 1-2 puffs into the lungs every 6 (six) hours as needed for wheezing or shortness of breath. 1 Inhaler 0  . amLODipine (NORVASC) 5 MG tablet Take 1 tablet (5 mg total) by mouth daily. 30 tablet 3  . Ascorbic Acid (VITAMIN C PO) Take 1 tablet by mouth daily.    . clindamycin (CLEOCIN T) 1 % external solution Apply 1 application topically at bedtime.    . Fluocinolone Acetonide Body 0.01 % OIL Apply 1 application topically once a week.    . Multiple Vitamins-Minerals (MULTIVITAMIN WITH MINERALS) tablet Take 1 tablet by mouth daily.    . pantoprazole (PROTONIX) 40 MG tablet Take 40 mg by mouth daily.    Marland Kitchen triamcinolone ointment (KENALOG) 0.1 % Apply 1 application topically 2 (two) times daily.    . Vitamin D, Ergocalciferol, (DRISDOL) 1.25 MG (50000 UNIT) CAPS capsule Take 50,000 Units by mouth once a week.    . baclofen (LIORESAL) 10 MG tablet Take 0.5-1 tablets (5-10 mg total) by mouth 3 (three) times daily as needed for muscle spasms. (Patient not taking: Reported on 11/17/2019) 30 each 3   No current facility-administered medications on file prior to visit.    Cardiovascular studies:  EKG 03/04/18: Normal sinus rhythm. Normal  ECG.  Echocardiogram (Outside) 06/30/2013: Left ventricle: The cavity size was normal. Wall thickness wasincreased in a pattern of mild LVH. Systolic function was normal. The estimated ejection fraction was in the range of 55% to 60%.Wall motion was normal; there were no regional wall motionabnormalities. Left ventricular diastolic function parameterswere normal.  Sleep Study 2015: OSA on CPAP.  Recent labs: 10/18/2019: Chol 223, TG 75, HDL 46, LDL 164   09/17/2019: Glucose 107, BUN/Cr 11/0.7. EGFR >60. Na/K 140/3.8. Rest of the CMP normal Trop HS <2, <2 H/H 11.7/37.7. MCV 80. Platelets 307   04/2018: Chol 178, TG 63, HDL 42, LDL 123    Review of Systems  Cardiovascular: Negative for chest pain, dyspnea on exertion, leg swelling, palpitations and syncope.         Vitals:   11/17/19 1546  BP: (!) 137/92  Pulse: (!) 101  Resp: 16  SpO2: 98%     Objective:    Physical Exam Vitals and nursing note reviewed.  Constitutional:      General: She is not in acute distress. Neck:     Vascular: No JVD.  Cardiovascular:     Rate and Rhythm: Normal rate and regular rhythm.     Heart sounds: Normal heart sounds. No murmur heard.   Pulmonary:     Effort: Pulmonary effort is normal.     Breath sounds: Normal breath sounds. No wheezing or rales.  Assessment & Recommendations:   39 y.o. African American female with obesity, atypical chest pain, hypertension   Hypertension: Better controlled on amlodipine.  In future, should she consider fertility treatment, could consider switching amlodipine to labetalol/nifedipine.  Chest pain: No recurrence since starting amlodipine.  Hyperlipidemia: LDL 164. Discussed statin use. Patient is unsure about initiation. Will obtain calcium score scan for risk stratification. Of course, if she decides to proceed with fertility treatment, will not be able to use statin during pregnancy.  Fu in 6 months  [place], MD [facility] Pager: [phone] Office: [phone] If no answer Cell [phone]

## 2019-11-18 ENCOUNTER — Encounter: Payer: Self-pay | Admitting: Cardiology

## 2019-11-23 ENCOUNTER — Other Ambulatory Visit: Payer: Self-pay

## 2019-11-23 ENCOUNTER — Ambulatory Visit (INDEPENDENT_AMBULATORY_CARE_PROVIDER_SITE_OTHER): Payer: No Typology Code available for payment source | Admitting: Family Medicine

## 2019-11-23 ENCOUNTER — Encounter: Payer: Self-pay | Admitting: Family Medicine

## 2019-11-23 DIAGNOSIS — G8929 Other chronic pain: Secondary | ICD-10-CM

## 2019-11-23 DIAGNOSIS — M5442 Lumbago with sciatica, left side: Secondary | ICD-10-CM

## 2019-11-23 DIAGNOSIS — M5441 Lumbago with sciatica, right side: Secondary | ICD-10-CM | POA: Diagnosis not present

## 2019-11-23 MED ORDER — METHOCARBAMOL 500 MG PO TABS
500.0000 mg | ORAL_TABLET | Freq: Four times a day (QID) | ORAL | 3 refills | Status: DC | PRN
Start: 2019-11-23 — End: 2020-05-22

## 2019-11-23 NOTE — Progress Notes (Signed)
I saw and examined the patient with Dr. Elouise Munroe and agree with assessment and plan as outlined.    Ongoing LBP, has been unable to do PT due to caring for mother, but now is able.  Remains tender in midline lumbosacral area.  Will start PT, try robaxin.  Consider MRI scan or referral for ESI if pain persists.

## 2019-11-23 NOTE — Progress Notes (Signed)
Office Visit Note   Patient: Diane Patel           Date of Birth: 08-Jul-1980           MRN: [id number] Visit Date: 11/23/2019 Requested by: Simona Huh, NP [place],  [place] [address] PCP: Simona Huh, NP  Subjective: Chief Complaint  Patient presents with   Lower Back - Pain    Pain in lower back and a feeling of "tightness" in the right hamstring. Tightness and knots in the lower back after working all day. Biofreeze helps some, short-lived. Has been walking and doing stretches. Was unable to do PT due to taking care of Mom.    HPI: 39yo F presenting to clinic to follow up on chronic lower back/sacral pain. Patient was previously seen in May, and given HEP, a PT referral, and Baclofen. 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 She tried to do some of the back stretches, but says that she felt uncomfortable tension in her hamstrings when trying the lower body ones. She also says that Baclofen made her too sleepy to take. She remains agreeable to trying PT, and says that her schedule is more open now that her mother has recovered. She takes tylenol for pain. Pain will rarely radiate down into her calves, but she denies weakness or bowel/bladder dysfunction. She will get her husband to rub at her back, which helps somewhat when she has an acute spasm.               ROS:   All other systems were reviewed and are negative.  Objective: Vital Signs: There were no vitals taken for this visit.  Physical Exam:  General:  Alert and oriented, in no acute distress. Pulm:  Breathing unlabored. Psy:  Normal mood, congruent affect. Skin:  No bruises, no rashes.   BACK EXAMINATION: Normal Gait.  Excessive lumbar lordosis when standing.   ROM: endorses pain with both forward flexion and extension. Approximately 16in from toe-touch.  Palpation: endorses significant pain with palpation of lower  lumbar paraspinal areas bilaterally, as well as bilateral SI Joints. Positive sacral springs.   Strength: Hip flexion (L1), Hip Aduction (L2), Knee Extension (L3) are 5/5 Bilaterally Foot Inversion (L4), Dorsiflexion (L5), and Eversion (S1) 5/5 Bilaterally Sensation: Intact to light touch medial and lateral aspects of lower extremities, and lateral, dorsal, and medial aspects of foot.   Special Tests:  SLR: No radiation down Ipilateral or contralateral leg bilaterally   Imaging: None Today.  Assessment & Plan: 39yo F presenting to clinic to follow up on chronic lower back pain. Poor improvement with HEP alone, as she was unable to attend formal PT.  - Given fatigue with Baclofen, ill switch to Methocarbamol - Patient's schedule is now more open, and she would like new referral to PT. Will place one. - encouraged continued home stretching exercises - If no improvement with PT, RTC for reevaluation and consideration of advanced imaging vs injection therapy.      Procedures: No procedures performed  No notes on file     PMFS History: Patient Active Problem List   Diagnosis Date Noted   Precordial pain 05/21/2018   Essential hypertension 04/15/2018   Obesity hypoventilation syndrome ([place]) 04/27/2014   Morbid obesity ([place]) 04/27/2014   Nocturia more than twice per night 04/27/2014   Sleep related headaches 04/27/2014   Bruxism,  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   OSA (obstructive sleep apnea) 09/14/2013   Upper airway cough syndrome 07/27/2013   SOB (shortness of breath) 07/09/2013   Family history of systemic lupus erythematosus 07/09/2013   Left arm pain 06/21/2013   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   Edema 06/21/2013   Anemia 12/22/2012   Screening cholesterol level 12/22/2012   Migraine 12/21/2012   Asthma 07/13/2010   MENORRHAGIA 03/30/2010   ECZEMA 03/30/2010   DYSPEPSIA 09/05/2009   POLYCYSTIC OVARIAN DISEASE 09/04/2009   OBESITY 09/04/2009   DEPRESSION  09/04/2009   GERD 09/04/2009   FATIGUE 09/04/2009   Atypical chest pain 09/04/2009   Past Medical History:  Diagnosis Date   Allergy    Anemia    Anxiety    Asthma    Back pain    resolved, no current problems per patient 11/25/18   Chest pain    no current problems per pt 11/25/18   Depression    Essential hypertension 04/15/2018   GERD (gastroesophageal reflux disease)    Headaches, cluster    resolved per patient 11/25/18, no longer a problem   Hyperlipidemia    Obesity, morbid ([facility])    OSA (obstructive sleep apnea) 01/2014   Does not use CPAP   PCOS (polycystic ovarian syndrome)    Pre-diabetes 11/2018   diet controlled/exercise, no meds, does not check sugar    Family History  Problem Relation Age of Onset   Cancer Maternal Grandmother        ? stomach cancer   Diabetes Maternal Grandmother    Hypertension Mother    Diabetes Mother    Heart disease Mother    Polycystic ovary syndrome Mother    Cancer - Ovarian Mother    Diabetes Father    Hypertension Father    Emphysema Father        smoked   Diabetes Sister    Pancreatitis Sister    Cancer Maternal Grandfather        bone   Glaucoma Maternal Grandfather    Osteoporosis Maternal Grandfather    Hypertension Paternal Grandfather    Mental illness Paternal Grandfather    Asthma Brother     Past Surgical History:  Procedure Laterality Date   APPENDECTOMY  2004   CHOLECYSTECTOMY  2010   CHROMOPERTUBATION N/A 11/26/2018   Procedure: CHROMOPERTUBATION;  Surgeon: Sanjuana Kava, MD;  Location: [facility];  Service: Gynecology;  Laterality: N/A;   COLONOSCOPY     polyps   DILATATION & CURRETTAGE/HYSTEROSCOPY WITH RESECTOCOPE N/A 11/26/2018   Procedure: DILATATION & CURETTAGE/DIAGNOSTIC HYSTEROSCOPY WITH POLYPECTOMY;  Surgeon: Sanjuana Kava, MD;  Location: [facility];  Service: Gynecology;  Laterality: N/A;   LAPAROSCOPY N/A 11/26/2018   Procedure: LAPAROSCOPY OPERATIVE;  Surgeon:  Sanjuana Kava, MD;  Location: [facility];  Service: Gynecology;  Laterality: N/A;   TONSILLECTOMY AND ADENOIDECTOMY  1985   UPPER GI ENDOSCOPY     WISDOM TOOTH EXTRACTION     Social History   Occupational History   Occupation: unemployed  Tobacco Use   Smoking status: Never Smoker   Smokeless tobacco: Never Used  Scientific laboratory technician Use: Never used  Substance and Sexual Activity   Alcohol use: No   Drug use: No   Sexual activity: Yes    Birth control/protection: None

## 2019-11-29 ENCOUNTER — Other Ambulatory Visit: Payer: Self-pay

## 2019-11-29 ENCOUNTER — Encounter: Payer: Self-pay | Admitting: Physical Therapy

## 2019-11-29 ENCOUNTER — Ambulatory Visit (INDEPENDENT_AMBULATORY_CARE_PROVIDER_SITE_OTHER): Payer: No Typology Code available for payment source | Admitting: Physical Therapy

## 2019-11-29 DIAGNOSIS — R262 Difficulty in walking, not elsewhere classified: Secondary | ICD-10-CM

## 2019-11-29 DIAGNOSIS — R293 Abnormal posture: Secondary | ICD-10-CM | POA: Diagnosis not present

## 2019-11-29 DIAGNOSIS — M5441 Lumbago with sciatica, right side: Secondary | ICD-10-CM

## 2019-11-29 DIAGNOSIS — M6281 Muscle weakness (generalized): Secondary | ICD-10-CM

## 2019-11-29 DIAGNOSIS — M5442 Lumbago with sciatica, left side: Secondary | ICD-10-CM

## 2019-11-29 DIAGNOSIS — G8929 Other chronic pain: Secondary | ICD-10-CM

## 2019-11-29 NOTE — Patient Instructions (Signed)
Access Code: [email or web address] URL: [email or web address] Date: 11/29/2019 Prepared by: Kearney Hard  Exercises Hooklying Hamstring Stretch with Strap - 2-3 x daily - 7 x weekly - 3-5 reps - 30 seconds hold Supine Bridge - 2-3 x daily - 7 x weekly - 2 sets - 10 reps - 3 seconds hold Hooklying Single Knee to Chest Stretch - 2-3 x daily - 7 x weekly - 3-5 reps - 30 seconds hold Supine Lower Trunk Rotation - 2-3 x daily - 7 x weekly - 3-5 reps - 30 seconds hold
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
## 2019-12-07 ENCOUNTER — Ambulatory Visit (INDEPENDENT_AMBULATORY_CARE_PROVIDER_SITE_OTHER): Payer: No Typology Code available for payment source | Admitting: Physical Therapy

## 2019-12-07 ENCOUNTER — Encounter: Payer: Self-pay | Admitting: Physical Therapy

## 2019-12-07 ENCOUNTER — Other Ambulatory Visit: Payer: Self-pay

## 2019-12-07 DIAGNOSIS — R262 Difficulty in walking, not elsewhere classified: Secondary | ICD-10-CM | POA: Diagnosis not present

## 2019-12-07 DIAGNOSIS — M5441 Lumbago with sciatica, right side: Secondary | ICD-10-CM

## 2019-12-07 DIAGNOSIS — R293 Abnormal posture: Secondary | ICD-10-CM | POA: Diagnosis not present

## 2019-12-07 DIAGNOSIS — M5442 Lumbago with sciatica, left side: Secondary | ICD-10-CM

## 2019-12-07 DIAGNOSIS — M6281 Muscle weakness (generalized): Secondary | ICD-10-CM

## 2019-12-07 DIAGNOSIS — G8929 Other chronic pain: Secondary | ICD-10-CM

## 2019-12-07 NOTE — Therapy (Addendum)
[facility] Physical Therapy [address] [place], [place], [address] Phone: [phone]   Fax:  [phone]  Physical Therapy Treatment Discharge Summary  Patient Details  Name: Diane Patel MRN: [id number] Date of Birth: October 27, 1980 Referring Provider (PT): Dr. Eunice Blase   Encounter Date: 12/07/2019   PT End of Session - 12/07/19 1121    Visit Number 2    Number of Visits 12    Date for PT Re-Evaluation 01/14/20    PT Start Time 1104    PT Stop Time 1145    PT Time Calculation (min) 41 min    Activity Tolerance Patient tolerated treatment well    Behavior During Therapy [facility] for tasks assessed/performed           Past Medical History:  Diagnosis Date  . Allergy   . Anemia   . Anxiety   . Asthma   . Back pain    resolved, no current problems per patient 11/25/18  . Chest pain    no current problems per pt 11/25/18  . Depression   . Essential hypertension 04/15/2018  . GERD (gastroesophageal reflux disease)   . Headaches, cluster    resolved per patient 11/25/18, no longer a problem  . Hyperlipidemia   . Obesity, morbid ([place])   . OSA (obstructive sleep apnea) 01/2014   Does not use CPAP  . PCOS (polycystic ovarian syndrome)   . Pre-diabetes 11/2018   diet controlled/exercise, no meds, does not check sugar    Past Surgical History:  Procedure Laterality Date  . APPENDECTOMY  2004  . CHOLECYSTECTOMY  2010  . CHROMOPERTUBATION N/A 11/26/2018   Procedure: CHROMOPERTUBATION;  Surgeon: Sanjuana Kava, MD;  Location: [place];  Service: Gynecology;  Laterality: N/A;  . COLONOSCOPY     polyps  . DILATATION & CURRETTAGE/HYSTEROSCOPY WITH RESECTOCOPE N/A 11/26/2018   Procedure: DILATATION & CURETTAGE/DIAGNOSTIC HYSTEROSCOPY WITH POLYPECTOMY;  Surgeon: Sanjuana Kava, MD;  Location: [place];  Service: Gynecology;  Laterality: N/A;  . LAPAROSCOPY N/A 11/26/2018   Procedure: LAPAROSCOPY OPERATIVE;  Surgeon: Sanjuana Kava, MD;  Location: [place];  Service:  Gynecology;  Laterality: N/A;  . TONSILLECTOMY AND ADENOIDECTOMY  1985  . UPPER GI ENDOSCOPY    . WISDOM TOOTH EXTRACTION      There were no vitals filed for this visit.   Subjective Assessment - 12/07/19 1113    Subjective Pt arriving today reporting 6/10 pain in low back. Pt still reporting difficulty sleeping at night.    Limitations Sitting;Standing;Walking;House hold activities    Patient Stated Goals sleep through the night, stop hurting so I can work without pain    Currently in Pain? Yes    Pain Score 6     Pain Location Back    Pain Orientation Lower;Mid    Pain Descriptors / Indicators Aching;Sore    Pain Type Chronic pain    Pain Onset More than a month ago                             [facility] Adult PT Treatment/Exercise - 12/07/19 0001      Posture/Postural Control   Posture/Postural Control Postural limitations    Postural Limitations Rounded Shoulders;Forward head      Exercises   Exercises Lumbar      Lumbar Exercises: Stretches   Active Hamstring Stretch Right;Left;2 reps;30 seconds    Single Knee to Chest Stretch Right;Left;3 reps;30 seconds    Piriformis Stretch Right;Left;2 reps;30  seconds    Figure 4 Stretch 2 reps;20 seconds    Other Lumbar Stretch Exercise slant board x 3 holding 20 seconds each      Lumbar Exercises: Aerobic   Nustep L4 x 5 minutes   moist heat on back while on Nustep     Lumbar Exercises: Standing   Heel Raises 15 reps;2 seconds    Row Strengthening;Both;15 reps;Theraband    Theraband Level (Row) Level 3 (Green)      Lumbar Exercises: Seated   Sit to Stand 10 reps    Sit to Stand Limitations using UE support      Lumbar Exercises: Supine   Bridge 10 reps;5 seconds    Straight Leg Raise 10 reps;2 seconds      Lumbar Exercises: Sidelying   Clam Both;15 reps                       PT Long Term Goals - 12/07/19 1124      PT LONG TERM GOAL #1   Title Pt will be indepdent in her HEP and  progression.    Status On-going      PT LONG TERM GOAL #2   Title Pt will be able to report sleeping through the night consistently without waking up in pain.    Status On-going      PT LONG TERM GOAL #3   Title Pt will be able to report sitting for >/= 30 minutes with pain </= 2/10.    Status On-going      PT LONG TERM GOAL #4   Title Pt will be able to report amb for 20 minutes with pain </= 2/10.    Status On-going      PT LONG TERM GOAL #5   Title Pt will improve her FOTO from 44% limitation to 34% limitation.    Status On-going                 Plan - 12/07/19 1122    Clinical Impression Statement Pt tolerating exercises well. No reporting mild increase in pain while performing L SLR. Concentration on core activation during all exericses with verbal cues. Continue skilled PT.    Personal Factors and Comorbidities Comorbidity 3+    Comorbidities obesity, OSA, allergies, asthma, back pain, chest pain, GERD, essential HTN, HA    Examination-Activity Limitations Sleep;Sit;Stairs;Stand;Lift;Bend;Other    Examination-Participation Restrictions Other;Cleaning;Community Activity;Occupation    Stability/Clinical Decision Making Stable/Uncomplicated    Rehab Potential Good    PT Frequency 2x / week    PT Duration 6 weeks    PT Treatment/Interventions Electrical Stimulation;Moist Heat;Ultrasound;Gait training;Stair training;Functional mobility training;Therapeutic activities;Therapeutic exercise;Balance training;Neuromuscular re-education;Patient/family education;Manual techniques;Passive range of motion;Dry needling;Taping;Cryotherapy    PT Next Visit Plan Nustep vs bike, posture correction, lumbar stretching, core strengthening, spinal mobs    PT Home Exercise Plan [id number]    Consulted and Agree with Plan of Care Patient           Patient will benefit from skilled therapeutic intervention in order to improve the following deficits and impairments:  Pain, Obesity, Decreased  strength, Decreased activity tolerance, Decreased range of motion, Postural dysfunction, Difficulty walking  Visit Diagnosis: Chronic bilateral low back pain with bilateral sciatica  Difficulty in walking, not elsewhere classified  Muscle weakness (generalized)  Abnormal posture  PHYSICAL THERAPY DISCHARGE SUMMARY  Visits from Start of Care: 2  Current functional level related to goals / functional outcomes: See above   Remaining deficits: See above  Education / Equipment: HEP  Plan: Patient agrees to discharge.  Patient goals were not met. Patient is being discharged due to not returning since the last visit.  ?????        Problem List Patient Active Problem List   Diagnosis Date Noted  . Precordial pain 05/21/2018  . Essential hypertension 04/15/2018  . Obesity hypoventilation syndrome ([place]) 04/27/2014  . Morbid obesity ([place]) 04/27/2014  . Nocturia more than twice per night 04/27/2014  . Sleep related headaches 04/27/2014  . 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 OSA (obstructive sleep apnea) 09/14/2013  . Upper airway cough syndrome 07/27/2013  . SOB (shortness of breath) 07/09/2013  . Family history of systemic lupus erythematosus 07/09/2013  . Left arm pain 06/21/2013  . 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 Edema 06/21/2013  . Anemia 12/22/2012  . Screening cholesterol level 12/22/2012  . Migraine 12/21/2012  . Asthma 07/13/2010  . MENORRHAGIA 03/30/2010  . ECZEMA 03/30/2010  . DYSPEPSIA 09/05/2009  . POLYCYSTIC OVARIAN DISEASE 09/04/2009  . OBESITY 09/04/2009  . DEPRESSION 09/04/2009  . GERD 09/04/2009  . FATIGUE 09/04/2009  . Atypical chest pain 09/04/2009    Oretha Caprice, PT, MPT 12/07/2019, 11:44 AM  [facility] Physical Therapy [address] [place], [place], [address] Phone: [phone]   Fax:  [phone]  Name: Diane Patel MRN: [id number] Date of Birth: [birth year]  Kearney Hard, PT, MPT 01/18/20 2:24 PM

## 2019-12-09 ENCOUNTER — Encounter: Payer: No Typology Code available for payment source | Admitting: Rehabilitative and Restorative Service Providers"

## 2019-12-14 ENCOUNTER — Encounter: Payer: No Typology Code available for payment source | Admitting: Physical Therapy

## 2019-12-16 ENCOUNTER — Encounter: Payer: No Typology Code available for payment source | Admitting: Rehabilitative and Restorative Service Providers"

## 2019-12-21 ENCOUNTER — Telehealth: Payer: Self-pay | Admitting: Physical Therapy

## 2019-12-21 ENCOUNTER — Encounter: Payer: No Typology Code available for payment source | Admitting: Physical Therapy

## 2019-12-21 NOTE — Telephone Encounter (Signed)
I called pt to follow up about her missed physical therapy appointment today at 11:00am. Pt's voice mail was full. I was unable to leave a message or a reminder of her upcoming appointment on Thursday 12/23/2019.  [place]ney Hard, PT 12/21/19 11:42 AM

## 2019-12-23 ENCOUNTER — Encounter: Payer: No Typology Code available for payment source | Admitting: Rehabilitative and Restorative Service Providers"

## 2019-12-23 ENCOUNTER — Telehealth: Payer: Self-pay | Admitting: Rehabilitative and Restorative Service Providers"

## 2019-12-23 NOTE — Telephone Encounter (Signed)
Left a message letting her know she had an appointment today at 1:45.  2nd no show so asked her to call by tomorrow (gave her Kisha's direct number) to confirm.  Let her know we are happy to see her if she confirms appointment by 5 Friday, otherwise we will open up her appointment spots for our wait list.  Happy to see her either way but need to open those spots if she is not attending PT.

## 2019-12-27 ENCOUNTER — Encounter: Payer: No Typology Code available for payment source | Admitting: Physical Therapy

## 2019-12-27 ENCOUNTER — Telehealth: Payer: Self-pay | Admitting: Physical Therapy

## 2019-12-27 NOTE — Telephone Encounter (Signed)
Called to follow up on why pt missed her 3:15 physical therapy appointment today. Voicemail not available.   Kearney Hard, PT, MPT 12/27/19 3:40 PM

## 2019-12-29 ENCOUNTER — Encounter: Payer: No Typology Code available for payment source | Admitting: Rehabilitative and Restorative Service Providers"

## 2020-01-03 ENCOUNTER — Emergency Department (HOSPITAL_BASED_OUTPATIENT_CLINIC_OR_DEPARTMENT_OTHER)
Admission: EM | Admit: 2020-01-03 | Discharge: 2020-01-03 | Disposition: A | Discharge status: Home / Self Care | Payer: No Typology Code available for payment source | Attending: Emergency Medicine | Admitting: Emergency Medicine

## 2020-01-03 ENCOUNTER — Other Ambulatory Visit: Payer: Self-pay

## 2020-01-03 ENCOUNTER — Encounter (HOSPITAL_BASED_OUTPATIENT_CLINIC_OR_DEPARTMENT_OTHER): Payer: Self-pay | Admitting: Emergency Medicine

## 2020-01-03 ENCOUNTER — Emergency Department (HOSPITAL_BASED_OUTPATIENT_CLINIC_OR_DEPARTMENT_OTHER): Payer: No Typology Code available for payment source

## 2020-01-03 DIAGNOSIS — K219 Gastro-esophageal reflux disease without esophagitis: Secondary | ICD-10-CM | POA: Insufficient documentation

## 2020-01-03 DIAGNOSIS — R531 Weakness: Secondary | ICD-10-CM | POA: Insufficient documentation

## 2020-01-03 DIAGNOSIS — R109 Unspecified abdominal pain: Secondary | ICD-10-CM | POA: Diagnosis present

## 2020-01-03 DIAGNOSIS — R140 Abdominal distension (gaseous): Secondary | ICD-10-CM | POA: Diagnosis not present

## 2020-01-03 DIAGNOSIS — R1032 Left lower quadrant pain: Secondary | ICD-10-CM | POA: Diagnosis not present

## 2020-01-03 DIAGNOSIS — R110 Nausea: Secondary | ICD-10-CM | POA: Diagnosis not present

## 2020-01-03 DIAGNOSIS — Z79899 Other long term (current) drug therapy: Secondary | ICD-10-CM | POA: Insufficient documentation

## 2020-01-03 DIAGNOSIS — Z20822 Contact with and (suspected) exposure to covid-19: Secondary | ICD-10-CM | POA: Diagnosis not present

## 2020-01-03 DIAGNOSIS — R1012 Left upper quadrant pain: Secondary | ICD-10-CM | POA: Insufficient documentation

## 2020-01-03 DIAGNOSIS — J45909 Unspecified asthma, uncomplicated: Secondary | ICD-10-CM | POA: Insufficient documentation

## 2020-01-03 LAB — CBC WITH DIFFERENTIAL/PLATELET
Abs Immature Granulocytes: 0.02 10*3/uL (ref 0.00–0.07)
Basophils Absolute: 0.1 10*3/uL (ref 0.0–0.1)
Basophils Relative: 1 %
Eosinophils Absolute: 0.1 10*3/uL (ref 0.0–0.5)
Eosinophils Relative: 1 %
HCT: 40.1 % (ref 36.0–46.0)
Hemoglobin: 12.7 g/dL (ref 12.0–15.0)
Immature Granulocytes: 0 %
Lymphocytes Relative: 20 %
Lymphs Abs: 1.6 10*3/uL (ref 0.7–4.0)
MCH: 25.3 pg — ABNORMAL LOW (ref 26.0–34.0)
MCHC: 31.7 g/dL (ref 30.0–36.0)
MCV: 80 fL (ref 80.0–100.0)
Monocytes Absolute: 0.5 10*3/uL (ref 0.1–1.0)
Monocytes Relative: 6 %
Neutro Abs: 5.9 10*3/uL (ref 1.7–7.7)
Neutrophils Relative %: 72 %
Platelets: 315 10*3/uL (ref 150–400)
RBC: 5.01 MIL/uL (ref 3.87–5.11)
RDW: 14.7 % (ref 11.5–15.5)
WBC: 8.1 10*3/uL (ref 4.0–10.5)
nRBC: 0 % (ref 0.0–0.2)

## 2020-01-03 LAB — COMPREHENSIVE METABOLIC PANEL
ALT: 20 U/L (ref 0–44)
AST: 20 U/L (ref 15–41)
Albumin: 3.8 g/dL (ref 3.5–5.0)
Alkaline Phosphatase: 59 U/L (ref 38–126)
Anion gap: 9 (ref 5–15)
BUN: 11 mg/dL (ref 6–20)
CO2: 22 mmol/L (ref 22–32)
Calcium: 8.7 mg/dL — ABNORMAL LOW (ref 8.9–10.3)
Chloride: 106 mmol/L (ref 98–111)
Creatinine, Ser: 0.82 mg/dL (ref 0.44–1.00)
GFR, Estimated: >60 mL/min (ref >60)
Glucose, Bld: 111 mg/dL — ABNORMAL HIGH (ref 70–99)
Potassium: 3.6 mmol/L (ref 3.5–5.1)
Sodium: 137 mmol/L (ref 135–145)
Total Bilirubin: 0.4 mg/dL (ref 0.3–1.2)
Total Protein: 7.1 g/dL (ref 6.5–8.1)

## 2020-01-03 LAB — LIPASE, BLOOD: Lipase: 28 U/L (ref 11–51)

## 2020-01-03 LAB — URINALYSIS, ROUTINE W REFLEX MICROSCOPIC
Bilirubin Urine: NEGATIVE
Glucose, UA: NEGATIVE mg/dL
Hgb urine dipstick: NEGATIVE
Ketones, ur: NEGATIVE mg/dL
Leukocytes,Ua: NEGATIVE
Nitrite: NEGATIVE
Protein, ur: NEGATIVE mg/dL
Specific Gravity, Urine: 1.01 (ref 1.005–1.030)
pH: 6 (ref 5.0–8.0)

## 2020-01-03 LAB — RESP PANEL BY RT-PCR (FLU A&B, COVID) ARPGX2
Influenza A by PCR: NEGATIVE
Influenza B by PCR: NEGATIVE
SARS Coronavirus 2 by RT PCR: NEGATIVE

## 2020-01-03 LAB — PREGNANCY, URINE: Preg Test, Ur: NEGATIVE

## 2020-01-03 MED ORDER — MORPHINE SULFATE (PF) 4 MG/ML IV SOLN
4.0000 mg | Freq: Once | INTRAVENOUS | Status: AC
  Administered 2020-01-03: 4 mg via INTRAVENOUS
  Filled 2020-01-03: qty 1

## 2020-01-03 MED ORDER — SODIUM CHLORIDE 0.9 % IV BOLUS
1000.0000 mL | Freq: Once | INTRAVENOUS | Status: AC
  Administered 2020-01-03: 1000 mL via INTRAVENOUS

## 2020-01-03 MED ORDER — ONDANSETRON HCL 4 MG/2ML IJ SOLN
4.0000 mg | Freq: Once | INTRAMUSCULAR | Status: AC
  Administered 2020-01-03: 4 mg via INTRAVENOUS
  Filled 2020-01-03: qty 2

## 2020-01-03 MED ORDER — IOHEXOL 300 MG/ML  SOLN
100.0000 mL | Freq: Once | INTRAMUSCULAR | Status: AC | PRN
  Administered 2020-01-03: 100 mL via INTRAVENOUS

## 2020-01-03 NOTE — ED Triage Notes (Signed)
Reports feeling bloated, nauseated, abd pain "tight."

## 2020-01-03 NOTE — Discharge Instructions (Signed)
CT scan without any acute findings.  Labs reassuring.  Recommend a trial of Pepcid for 2 weeks.  Take 20 mg twice a day this is available over-the-counter.  Make an appointment to follow-up with your doctor.

## 2020-01-03 NOTE — ED Provider Notes (Signed)
CT scan without any acute abnormalities.  Labs without any significant abnormalities.  Patient does have a history of irritable bowel syndrome could be related to that but since its left upper quadrant abdominal pain we will give a trial of Pepcid for 2 weeks which patient will get over-the-counter because it will be cheaper for her.  Patient will follow up with her primary care doctor.  She will return for any new or worse symptoms.   Fredia Sorrow, MD 01/03/20 [phone]

## 2020-01-03 NOTE — ED Provider Notes (Signed)
[facility] EMERGENCY DEPARTMENT Provider Note   CSN: [id number] Arrival date & time: 01/03/20  0440     History Chief Complaint  Patient presents with  . Abdominal Pain    Diane Patel is a 39 y.o. female.  HPI     This is a 39 year old female with a history of obesity, hyperlipidemia, prediabetes who presents with abdominal bloating, nausea and generalized weakness. Patient reports that she has not felt well since before Thanksgiving. She reports bloating sensation and feeling like her abdomen is tight. She states that this is despite not overeating. She has had nausea without vomiting. She reports frequent bowel movements but no bloody bowel movements. She also reports frequent urination. No hematuria or dysuria. At times she has had some discomfort and achiness in the left side of her abdomen. Currently she is pain-free. She denies any recent fevers, cough, or other systemic symptoms. No known sick contacts or Covid exposures. She is fully vaccinated against COVID-19.  Patient reports that her blood sugars at home were within normal range.  Past Medical History:  Diagnosis Date  . Allergy   . Anemia   . Anxiety   . Asthma   . Back pain    resolved, no current problems per patient 11/25/18  . Chest pain    no current problems per pt 11/25/18  . Depression   . Essential hypertension 04/15/2018  . GERD (gastroesophageal reflux disease)   . Headaches, cluster    resolved per patient 11/25/18, no longer a problem  . Hyperlipidemia   . Obesity, morbid ([place])   . OSA (obstructive sleep apnea) 01/2014   Does not use CPAP  . PCOS (polycystic ovarian syndrome)   . Pre-diabetes 11/2018   diet controlled/exercise, no meds, does not check sugar    Patient Active Problem List   Diagnosis Date Noted  . Precordial pain 05/21/2018  . Essential hypertension 04/15/2018  . Obesity hypoventilation syndrome ([place]) 04/27/2014  . Morbid obesity ([place]) 04/27/2014  . Nocturia  more than twice per night 04/27/2014  . Sleep related headaches 04/27/2014  . 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 OSA (obstructive sleep apnea) 09/14/2013  . Upper airway cough syndrome 07/27/2013  . SOB (shortness of breath) 07/09/2013  . Family history of systemic lupus erythematosus 07/09/2013  . Left arm pain 06/21/2013  . 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 Edema 06/21/2013  . Anemia 12/22/2012  . Screening cholesterol level 12/22/2012  . Migraine 12/21/2012  . Asthma 07/13/2010  . MENORRHAGIA 03/30/2010  . ECZEMA 03/30/2010  . DYSPEPSIA 09/05/2009  . POLYCYSTIC OVARIAN DISEASE 09/04/2009  . OBESITY 09/04/2009  . DEPRESSION 09/04/2009  . GERD 09/04/2009  . FATIGUE 09/04/2009  . Atypical chest pain 09/04/2009    Past Surgical History:  Procedure Laterality Date  . APPENDECTOMY  2004  . CHOLECYSTECTOMY  2010  . CHROMOPERTUBATION N/A 11/26/2018   Procedure: CHROMOPERTUBATION;  Surgeon: Sanjuana Kava, MD;  Location: [facility];  Service: Gynecology;  Laterality: N/A;  . COLONOSCOPY     polyps  . DILATATION & CURRETTAGE/HYSTEROSCOPY WITH RESECTOCOPE N/A 11/26/2018   Procedure: DILATATION & CURETTAGE/DIAGNOSTIC HYSTEROSCOPY WITH POLYPECTOMY;  Surgeon: Sanjuana Kava, MD;  Location: [facility];  Service: Gynecology;  Laterality: N/A;  . LAPAROSCOPY N/A 11/26/2018   Procedure: LAPAROSCOPY OPERATIVE;  Surgeon: Sanjuana Kava, MD;  Location: [facility];  Service: Gynecology;  Laterality: N/A;  . TONSILLECTOMY AND ADENOIDECTOMY  1985  . UPPER GI ENDOSCOPY    . WISDOM TOOTH EXTRACTION       OB  History   No obstetric history on file.     Family History  Problem Relation Age of Onset  . Cancer Maternal Grandmother        ? stomach cancer  . Diabetes Maternal Grandmother   . Hypertension Mother   . Diabetes Mother   . Heart disease Mother   . Polycystic ovary syndrome Mother   . Cancer - Ovarian Mother   . Diabetes Father   . Hypertension Father   . Emphysema Father        smoked  . Diabetes  Sister   . Pancreatitis Sister   . Cancer Maternal Grandfather        bone  . Glaucoma Maternal Grandfather   . Osteoporosis Maternal Grandfather   . Hypertension Paternal Grandfather   . Mental illness Paternal Grandfather   . Asthma Brother     Social History   Tobacco Use  . Smoking status: Never Smoker  . Smokeless tobacco: Never Used  Vaping Use  . Vaping Use: Never used  Substance Use Topics  . Alcohol use: No  . Drug use: No    Home Medications Prior to Admission medications   Medication Sig Start Date End Date Taking? Authorizing Provider  pantoprazole (PROTONIX) 40 MG tablet Take 40 mg by mouth daily.   Yes [provider]  acetaminophen (TYLENOL) 500 MG tablet Take 1 tablet (500 mg total) by mouth every 6 (six) hours as needed. Patient taking differently: Take 500-1,000 mg by mouth every 6 (six) hours as needed for mild pain or headache.  04/13/19   Rodell Perna A, PA-C  albuterol (PROVENTIL HFA;VENTOLIN HFA) 108 (90 Base) MCG/ACT inhaler Inhale 1-2 puffs into the lungs every 6 (six) hours as needed for wheezing or shortness of breath. 04/18/17   Khatri, Hina, PA-C  amLODipine (NORVASC) 5 MG tablet Take 1 tablet (5 mg total) by mouth daily. 10/15/19 01/13/20  Patwardhan, Reynold Bowen, MD  Ascorbic Acid (VITAMIN C PO) Take 1 tablet by mouth daily.    [provider]  baclofen (LIORESAL) 10 MG tablet Take 0.5-1 tablets (5-10 mg total) by mouth 3 (three) times daily as needed for muscle spasms. Patient not taking: Reported on 11/17/2019 06/25/19   Hilts, Michael, MD  clindamycin (CLEOCIN T) 1 % external solution Apply 1 application topically at bedtime. 08/16/19   [provider]  Fluocinolone Acetonide Body 0.01 % OIL Apply 1 application topically once a week. 08/16/19   [provider]  methocarbamol (ROBAXIN) 500 MG tablet Take 1 tablet (500 mg total) by mouth every 6 (six) hours as needed for muscle spasms. 11/23/19   Hilts, Legrand Como, MD  Multiple  Vitamins-Minerals (MULTIVITAMIN WITH MINERALS) tablet Take 1 tablet by mouth daily.    [provider]  triamcinolone ointment (KENALOG) 0.1 % Apply 1 application topically 2 (two) times daily. 08/16/19   [provider]  Vitamin D, Ergocalciferol, (DRISDOL) 1.25 MG (50000 UNIT) CAPS capsule Take 50,000 Units by mouth once a week. 05/05/19   [provider]    Allergies    Ace inhibitors, Pineapple, Flexeril [cyclobenzaprine], Lactose intolerance (gi), Chocolate, Cocoa, and Naproxen  Review of Systems   Review of Systems  Constitutional: Negative for fever.  Respiratory: Negative for shortness of breath.   Cardiovascular: Negative for chest pain.  Gastrointestinal: Positive for abdominal pain and nausea. Negative for constipation, diarrhea and vomiting.  Genitourinary: Positive for frequency. Negative for dysuria and hematuria.  Musculoskeletal: Negative for back pain.  All other systems  reviewed and are negative.   Physical Exam Updated Vital Signs BP (!) 171/107 (BP Location: Left Arm)   Pulse 96   Temp 98.5 F (36.9 C)   Resp 18   Wt (!) 144.3 kg   LMP 11/05/2019 (Within Weeks)   SpO2 100%   BMI 58.18 kg/m   Physical Exam Vitals and nursing note reviewed.  Constitutional:      Appearance: She is well-developed. She is obese. She is not ill-appearing.  HENT:     Head: Normocephalic and atraumatic.  Eyes:     Pupils: Pupils are equal, round, and reactive to light.  Cardiovascular:     Rate and Rhythm: Normal rate and regular rhythm.     Heart sounds: Normal heart sounds.  Pulmonary:     Effort: Pulmonary effort is normal. No respiratory distress.     Breath sounds: No wheezing.  Abdominal:     General: Bowel sounds are normal.     Palpations: Abdomen is soft.     Tenderness: There is no abdominal tenderness. There is no guarding or rebound.  Musculoskeletal:     Cervical back: Neck supple.  Skin:    General: Skin is warm and dry.    Neurological:     Mental Status: She is alert and oriented to person, place, and time.  Psychiatric:        Behavior: Behavior normal.     ED Results / Procedures / Treatments   Labs (all labs ordered are listed, but only abnormal results are displayed) Labs Reviewed  CBC WITH DIFFERENTIAL/PLATELET - Abnormal; Notable for the following components:      Result Value   MCH 25.3 (*)    All other components within normal limits  COMPREHENSIVE METABOLIC PANEL - Abnormal; Notable for the following components:   Glucose, Bld 111 (*)    Calcium 8.7 (*)    All other components within normal limits  RESP PANEL BY RT-PCR (FLU A&B, COVID) ARPGX2  LIPASE, BLOOD  URINALYSIS, ROUTINE W REFLEX MICROSCOPIC  PREGNANCY, URINE    EKG None  Radiology No results found.  Procedures Procedures (including critical care time)  Medications Ordered in ED Medications  sodium chloride 0.9 % bolus 1,000 mL (has no administration in time range)  morphine 4 MG/ML injection 4 mg (has no administration in time range)  ondansetron (ZOFRAN) injection 4 mg (4 mg Intravenous Given 01/03/20 0528)    ED Course  I have reviewed the triage vital signs and the nursing notes.  Pertinent labs & imaging results that were available during my care of the patient were reviewed by me and considered in my medical decision making (see chart for details).  Clinical Course as of Jan 03 640  [facility] Jan 03, 2020  0640 Patient with continued left-sided discomfort and nausea.  Reviewed labs.  Will obtain CT scan to rule out diverticulitis.   [CH]    Clinical Course User Index [CH] Minela Bridgewater, Barbette Hair, MD   MDM Rules/Calculators/A&P                          Patient reports nausea, abdominal pain, generalized weakness.  She is overall nontoxic and vital signs are notable for blood pressure 171/107.  She is afebrile.  Symptoms are somewhat the and she is not currently having pain but she does report some frequent  stooling.  Given location of pain on exam, diverticulitis would be consideration.  Also SBO or UTI.  She  has several systemic symptoms which could indicate a viral etiology.  For this reason we will test for COVID-19 although she is fully vaccinated.  Patient was given Zofran.  Lab work obtained.  No significant leukocytosis.  No metabolic derangements.  Normal lipase.  Given urinalysis are negative.  On recheck, she continues to report that she feels poorly and now is reporting more left-sided abdominal pain.  For this reason we will obtain CT scan.  Final Clinical Impression(s) / ED Diagnoses Final diagnoses:  Left lower quadrant abdominal pain    Rx / DC Orders ED Discharge Orders    None       Merryl Hacker, MD 01/03/20 [phone]

## 2020-01-04 ENCOUNTER — Encounter: Payer: No Typology Code available for payment source | Admitting: Physical Therapy

## 2020-01-05 ENCOUNTER — Encounter (HOSPITAL_COMMUNITY): Payer: Self-pay

## 2020-01-05 ENCOUNTER — Other Ambulatory Visit: Payer: Self-pay

## 2020-01-05 DIAGNOSIS — R531 Weakness: Secondary | ICD-10-CM | POA: Diagnosis present

## 2020-01-05 DIAGNOSIS — K219 Gastro-esophageal reflux disease without esophagitis: Secondary | ICD-10-CM | POA: Diagnosis not present

## 2020-01-05 DIAGNOSIS — J45909 Unspecified asthma, uncomplicated: Secondary | ICD-10-CM | POA: Diagnosis not present

## 2020-01-05 DIAGNOSIS — E860 Dehydration: Secondary | ICD-10-CM | POA: Insufficient documentation

## 2020-01-05 DIAGNOSIS — I10 Essential (primary) hypertension: Secondary | ICD-10-CM | POA: Diagnosis not present

## 2020-01-05 DIAGNOSIS — Z79899 Other long term (current) drug therapy: Secondary | ICD-10-CM | POA: Diagnosis not present

## 2020-01-05 NOTE — ED Triage Notes (Addendum)
Pt reports generalized weakness and one episode of vomiting. Pt reports being seen for the same on 11/29.

## 2020-01-06 ENCOUNTER — Encounter: Payer: No Typology Code available for payment source | Admitting: Rehabilitative and Restorative Service Providers"

## 2020-01-06 ENCOUNTER — Other Ambulatory Visit: Payer: Self-pay | Admitting: Cardiology

## 2020-01-06 ENCOUNTER — Emergency Department (HOSPITAL_COMMUNITY)
Admission: EM | Admit: 2020-01-06 | Discharge: 2020-01-06 | Disposition: A | Discharge status: Home / Self Care | Payer: No Typology Code available for payment source | Attending: Emergency Medicine | Admitting: Emergency Medicine

## 2020-01-06 ENCOUNTER — Emergency Department (HOSPITAL_COMMUNITY): Payer: No Typology Code available for payment source

## 2020-01-06 DIAGNOSIS — E860 Dehydration: Secondary | ICD-10-CM

## 2020-01-06 DIAGNOSIS — I10 Essential (primary) hypertension: Secondary | ICD-10-CM

## 2020-01-06 DIAGNOSIS — R112 Nausea with vomiting, unspecified: Secondary | ICD-10-CM

## 2020-01-06 DIAGNOSIS — K2960 Other gastritis without bleeding: Secondary | ICD-10-CM

## 2020-01-06 LAB — URINALYSIS, ROUTINE W REFLEX MICROSCOPIC
Bilirubin Urine: NEGATIVE
Glucose, UA: NEGATIVE mg/dL
Hgb urine dipstick: NEGATIVE
Ketones, ur: 5 mg/dL — AB
Leukocytes,Ua: NEGATIVE
Nitrite: NEGATIVE
Protein, ur: NEGATIVE mg/dL
Specific Gravity, Urine: 1.017 (ref 1.005–1.030)
pH: 5 (ref 5.0–8.0)

## 2020-01-06 LAB — COMPREHENSIVE METABOLIC PANEL
ALT: 19 U/L (ref 0–44)
AST: 18 U/L (ref 15–41)
Albumin: 4.5 g/dL (ref 3.5–5.0)
Alkaline Phosphatase: 68 U/L (ref 38–126)
Anion gap: 10 (ref 5–15)
BUN: 8 mg/dL (ref 6–20)
CO2: 26 mmol/L (ref 22–32)
Calcium: 9.3 mg/dL (ref 8.9–10.3)
Chloride: 103 mmol/L (ref 98–111)
Creatinine, Ser: 0.95 mg/dL (ref 0.44–1.00)
GFR, Estimated: >60 mL/min (ref >60)
Glucose, Bld: 92 mg/dL (ref 70–99)
Potassium: 3.7 mmol/L (ref 3.5–5.1)
Sodium: 139 mmol/L (ref 135–145)
Total Bilirubin: 1 mg/dL (ref 0.3–1.2)
Total Protein: 7.9 g/dL (ref 6.5–8.1)

## 2020-01-06 LAB — TROPONIN I (HIGH SENSITIVITY)
Troponin I (High Sensitivity): <2 ng/L (ref <18)
Troponin I (High Sensitivity): <2 ng/L (ref <18)

## 2020-01-06 LAB — CBC WITH DIFFERENTIAL/PLATELET
Abs Immature Granulocytes: 0.03 10*3/uL (ref 0.00–0.07)
Basophils Absolute: 0.1 10*3/uL (ref 0.0–0.1)
Basophils Relative: 1 %
Eosinophils Absolute: 0.1 10*3/uL (ref 0.0–0.5)
Eosinophils Relative: 1 %
HCT: 43.6 % (ref 36.0–46.0)
Hemoglobin: 13.6 g/dL (ref 12.0–15.0)
Immature Granulocytes: 0 %
Lymphocytes Relative: 21 %
Lymphs Abs: 2.2 10*3/uL (ref 0.7–4.0)
MCH: 25.2 pg — ABNORMAL LOW (ref 26.0–34.0)
MCHC: 31.2 g/dL (ref 30.0–36.0)
MCV: 80.7 fL (ref 80.0–100.0)
Monocytes Absolute: 0.7 10*3/uL (ref 0.1–1.0)
Monocytes Relative: 7 %
Neutro Abs: 7.5 10*3/uL (ref 1.7–7.7)
Neutrophils Relative %: 70 %
Platelets: 370 10*3/uL (ref 150–400)
RBC: 5.4 MIL/uL — ABNORMAL HIGH (ref 3.87–5.11)
RDW: 14.9 % (ref 11.5–15.5)
WBC: 10.6 10*3/uL — ABNORMAL HIGH (ref 4.0–10.5)
nRBC: 0 % (ref 0.0–0.2)

## 2020-01-06 LAB — LIPASE, BLOOD: Lipase: 26 U/L (ref 11–51)

## 2020-01-06 LAB — I-STAT BETA HCG BLOOD, ED (MC, WL, AP ONLY): I-stat hCG, quantitative: <5 m[IU]/mL (ref <5)

## 2020-01-06 MED ORDER — ONDANSETRON HCL 4 MG PO TABS: 4.0000 mg | ORAL_TABLET | Freq: Four times a day (QID) | ORAL | 0 refills | Status: AC

## 2020-01-06 MED ORDER — LIDOCAINE VISCOUS HCL 2 % MT SOLN
15.0000 mL | Freq: Once | OROMUCOSAL | Status: AC
  Administered 2020-01-06: 15 mL via ORAL
  Filled 2020-01-06: qty 15

## 2020-01-06 MED ORDER — ONDANSETRON HCL 4 MG/2ML IJ SOLN
4.0000 mg | Freq: Once | INTRAMUSCULAR | Status: AC
  Administered 2020-01-06: 4 mg via INTRAVENOUS
  Filled 2020-01-06: qty 2

## 2020-01-06 MED ORDER — ALUM & MAG HYDROXIDE-SIMETH 200-200-20 MG/5ML PO SUSP
30.0000 mL | Freq: Once | ORAL | Status: AC
  Administered 2020-01-06: 30 mL via ORAL
  Filled 2020-01-06: qty 30

## 2020-01-06 MED ORDER — SODIUM CHLORIDE 0.9 % IV BOLUS
1000.0000 mL | Freq: Once | INTRAVENOUS | Status: AC
  Administered 2020-01-06: 1000 mL via INTRAVENOUS

## 2020-01-06 NOTE — ED Provider Notes (Signed)
[facility] DEPT Provider Note   CSN: [id number] Arrival date & time: 01/05/20  2231     History Chief Complaint  Patient presents with  . Weakness    Diane Patel is a 39 y.o. female.  The history is provided by the patient and medical records. No language interpreter was used.  Emesis Severity:  Severe Duration:  2 days Timing:  Intermittent Quality:  Undigested food and stomach contents Progression:  Unchanged Chronicity:  Recurrent Recent urination:  Normal Relieved by:  Nothing Worsened by:  Nothing Ineffective treatments:  None tried Associated symptoms: abdominal pain   Associated symptoms: no chills, no cough, no diarrhea, no fever, no headaches and no URI        Past Medical History:  Diagnosis Date  . Allergy   . Anemia   . Anxiety   . Asthma   . Back pain    resolved, no current problems per patient 11/25/18  . Chest pain    no current problems per pt 11/25/18  . Depression   . Essential hypertension 04/15/2018  . GERD (gastroesophageal reflux disease)   . Headaches, cluster    resolved per patient 11/25/18, no longer a problem  . Hyperlipidemia   . Obesity, morbid ([place])   . OSA (obstructive sleep apnea) 01/2014   Does not use CPAP  . PCOS (polycystic ovarian syndrome)   . Pre-diabetes 11/2018   diet controlled/exercise, no meds, does not check sugar    Patient Active Problem List   Diagnosis Date Noted  . Precordial pain 05/21/2018  . Essential hypertension 04/15/2018  . Obesity hypoventilation syndrome ([place]) 04/27/2014  . Morbid obesity ([place]) 04/27/2014  . Nocturia more than twice per night 04/27/2014  . Sleep related headaches 04/27/2014  . 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 OSA (obstructive sleep apnea) 09/14/2013  . Upper airway cough syndrome 07/27/2013  . SOB (shortness of breath) 07/09/2013  . Family history of systemic lupus erythematosus 07/09/2013  . Left arm pain 06/21/2013  . Exophthalmos  06/21/2013  . Edema 06/21/2013  . Anemia 12/22/2012  . Screening cholesterol level 12/22/2012  . Migraine 12/21/2012  . Asthma 07/13/2010  . MENORRHAGIA 03/30/2010  . ECZEMA 03/30/2010  . DYSPEPSIA 09/05/2009  . POLYCYSTIC OVARIAN DISEASE 09/04/2009  . OBESITY 09/04/2009  . DEPRESSION 09/04/2009  . GERD 09/04/2009  . FATIGUE 09/04/2009  . Atypical chest pain 09/04/2009    Past Surgical History:  Procedure Laterality Date  . APPENDECTOMY  2004  . CHOLECYSTECTOMY  2010  . CHROMOPERTUBATION N/A 11/26/2018   Procedure: CHROMOPERTUBATION;  Surgeon: Sanjuana Kava, MD;  Location: [facility];  Service: Gynecology;  Laterality: N/A;  . COLONOSCOPY     polyps  . DILATATION & CURRETTAGE/HYSTEROSCOPY WITH RESECTOCOPE N/A 11/26/2018   Procedure: DILATATION & CURETTAGE/DIAGNOSTIC HYSTEROSCOPY WITH POLYPECTOMY;  Surgeon: Sanjuana Kava, MD;  Location: [facility];  Service: Gynecology;  Laterality: N/A;  . LAPAROSCOPY N/A 11/26/2018   Procedure: LAPAROSCOPY OPERATIVE;  Surgeon: Sanjuana Kava, MD;  Location: [facility];  Service: Gynecology;  Laterality: N/A;  . TONSILLECTOMY AND ADENOIDECTOMY  1985  . UPPER GI ENDOSCOPY    . WISDOM TOOTH EXTRACTION       OB History   No obstetric history on file.     Family History  Problem Relation Age of Onset  . Cancer Maternal Grandmother        ? stomach cancer  . Diabetes Maternal Grandmother   . Hypertension Mother   . Diabetes Mother   . Heart  disease Mother   . Polycystic ovary syndrome Mother   . Cancer - Ovarian Mother   . Diabetes Father   . Hypertension Father   . Emphysema Father        smoked  . Diabetes Sister   . Pancreatitis Sister   . Cancer Maternal Grandfather        bone  . Glaucoma Maternal Grandfather   . Osteoporosis Maternal Grandfather   . Hypertension Paternal Grandfather   . Mental illness Paternal Grandfather   . Asthma Brother     Social History   Tobacco Use  . Smoking status: Never Smoker  . Smokeless tobacco: Never Used   Vaping Use  . Vaping Use: Never used  Substance Use Topics  . Alcohol use: No  . Drug use: No    Home Medications Prior to Admission medications   Medication Sig Start Date End Date Taking? Authorizing Provider  acetaminophen (TYLENOL) 500 MG tablet Take 1 tablet (500 mg total) by mouth every 6 (six) hours as needed. Patient taking differently: Take 500-1,000 mg by mouth every 6 (six) hours as needed for mild pain or headache.  04/13/19   Rodell Perna A, PA-C  albuterol (PROVENTIL HFA;VENTOLIN HFA) 108 (90 Base) MCG/ACT inhaler Inhale 1-2 puffs into the lungs every 6 (six) hours as needed for wheezing or shortness of breath. 04/18/17   Khatri, Hina, PA-C  amLODipine (NORVASC) 5 MG tablet Take 1 tablet (5 mg total) by mouth daily. 10/15/19 01/13/20  Patwardhan, Reynold Bowen, MD  Ascorbic Acid (VITAMIN C PO) Take 1 tablet by mouth daily.    [provider]  baclofen (LIORESAL) 10 MG tablet Take 0.5-1 tablets (5-10 mg total) by mouth 3 (three) times daily as needed for muscle spasms. Patient not taking: Reported on 11/17/2019 06/25/19   Hilts, Michael, MD  clindamycin (CLEOCIN T) 1 % external solution Apply 1 application topically at bedtime. 08/16/19   [provider]  Fluocinolone Acetonide Body 0.01 % OIL Apply 1 application topically once a week. 08/16/19   [provider]  methocarbamol (ROBAXIN) 500 MG tablet Take 1 tablet (500 mg total) by mouth every 6 (six) hours as needed for muscle spasms. 11/23/19   Hilts, Legrand Como, MD  Multiple Vitamins-Minerals (MULTIVITAMIN WITH MINERALS) tablet Take 1 tablet by mouth daily.    [provider]  pantoprazole (PROTONIX) 40 MG tablet Take 40 mg by mouth daily.    [provider]  triamcinolone ointment (KENALOG) 0.1 % Apply 1 application topically 2 (two) times daily. 08/16/19   [provider]  Vitamin D, Ergocalciferol, (DRISDOL) 1.25 MG (50000 UNIT) CAPS capsule Take 50,000 Units by mouth once a week.  05/05/19   [provider]    Allergies    Ace inhibitors, Pineapple, Flexeril [cyclobenzaprine], Lactose intolerance (gi), Chocolate, Cocoa, and Naproxen  Review of Systems   Review of Systems  Constitutional: Positive for fatigue. Negative for chills, diaphoresis and fever.  HENT: Negative for congestion.   Eyes: Negative for visual disturbance.  Respiratory: Negative for cough, chest tightness, shortness of breath and wheezing.   Cardiovascular: Positive for chest pain. Negative for palpitations and leg swelling.  Gastrointestinal: Positive for abdominal pain, constipation, nausea and vomiting. Negative for diarrhea.  Genitourinary: Positive for frequency.  Musculoskeletal: Negative for back pain, neck pain and neck stiffness.  Neurological: Negative for speech difficulty, weakness, light-headedness, numbness and headaches.  Psychiatric/Behavioral: Negative for agitation and confusion.  All other systems reviewed and are negative.   Physical Exam  Updated Vital Signs BP 115/80 (BP Location: Left Arm)   Pulse 80   Temp 98.4 F (36.9 C) (Oral)   Resp 16   Ht 5\' 2"  (1.575 m)   Wt (!) 140.6 kg   SpO2 100%   BMI 56.70 kg/m   Physical Exam Vitals and nursing note reviewed.  Constitutional:      General: She is not in acute distress.    Appearance: She is well-developed. She is not ill-appearing, toxic-appearing or diaphoretic.  HENT:     Head: Normocephalic and atraumatic.     Right Ear: External ear normal.     Left Ear: External ear normal.     Nose: Nose normal. No congestion or rhinorrhea.     Mouth/Throat:     Mouth: Mucous membranes are dry.     Pharynx: No oropharyngeal exudate or posterior oropharyngeal erythema.  Eyes:     Extraocular Movements: Extraocular movements intact.     Conjunctiva/sclera: Conjunctivae normal.     Pupils: Pupils are equal, round, and reactive to light.  Cardiovascular:     Rate and Rhythm: Normal rate.     Pulses: Normal  pulses.     Heart sounds: No murmur heard.   Pulmonary:     Effort: Pulmonary effort is normal. No respiratory distress.     Breath sounds: No stridor. No wheezing, rhonchi or rales.  Chest:     Chest wall: No tenderness.  Abdominal:     General: Abdomen is flat. There is no distension.     Tenderness: There is abdominal tenderness. There is no right CVA tenderness, left CVA tenderness, guarding or rebound.  Musculoskeletal:        General: No tenderness.     Cervical back: Normal range of motion and neck supple. No tenderness.     Right lower leg: No edema.     Left lower leg: No edema.  Skin:    General: Skin is warm.     Capillary Refill: Capillary refill takes less than 2 seconds.     Coloration: Skin is not pale.     Findings: No erythema or rash.  Neurological:     Mental Status: She is alert and oriented to person, place, and time.     Sensory: No sensory deficit.     Motor: No weakness or abnormal muscle tone.     Coordination: Coordination normal.     Deep Tendon Reflexes: Reflexes are normal and symmetric.  Psychiatric:        Mood and Affect: Mood normal.     ED Results / Procedures / Treatments   Labs (all labs ordered are listed, but only abnormal results are displayed) Labs Reviewed  CBC WITH DIFFERENTIAL/PLATELET - Abnormal; Notable for the following components:      Result Value   WBC 10.6 (*)    RBC 5.40 (*)    MCH 25.2 (*)    All other components within normal limits  URINALYSIS, ROUTINE W REFLEX MICROSCOPIC - Abnormal; Notable for the following components:   Ketones, ur 5 (*)    All other components within normal limits  URINE CULTURE  COMPREHENSIVE METABOLIC PANEL  LIPASE, BLOOD  I-STAT BETA HCG BLOOD, ED ([facility], [facility], [facility] ONLY)  TROPONIN I (HIGH SENSITIVITY)  TROPONIN I (HIGH SENSITIVITY)    EKG EKG Interpretation  Date/Time:  Thursday January 06 2020 08:47:02 EST Ventricular Rate:  71 PR Interval:    QRS Duration: 86 QT Interval:  386 QTC  Calculation: 420 R  Axis:   25 Text Interpretation: Sinus rhythm Low voltage, precordial leads When compared to prior, slightly more t wave inversion in lead 3. No STEMI Confirmed by Antony Blackbird [phone]) on 01/06/2020 9:32:30 AM   Radiology DG Chest 2 View  Result Date: 01/06/2020 CLINICAL DATA:  39 y.o female reports, chest pain, generalized weakness, and one episode of vomiting. Pt reports being seen for the same on 11/29. EXAM: CHEST - 2 VIEW COMPARISON:  09/17/2019 FINDINGS: The heart size and mediastinal contours are within normal limits. Both lungs are clear. No pleural effusion or pneumothorax. The visualized skeletal structures are intact. IMPRESSION: No active cardiopulmonary disease. Electronically Signed   By: Lajean Manes M.D.   On: 01/06/2020 08:25    Procedures Procedures (including critical care time)  Medications Ordered in ED Medications  sodium chloride 0.9 % bolus 1,000 mL (0 mLs Intravenous Stopped 01/06/20 1039)  alum & mag hydroxide-simeth (MAALOX/MYLANTA) 200-200-20 MG/5ML suspension 30 mL (30 mLs Oral Given 01/06/20 0828)    And  lidocaine (XYLOCAINE) 2 % viscous mouth solution 15 mL (15 mLs Oral Given 01/06/20 0828)  ondansetron (ZOFRAN) injection 4 mg (4 mg Intravenous Given 01/06/20 5176)    ED Course  I have reviewed the triage vital signs and the nursing notes.  Pertinent labs & imaging results that were available during my care of the patient were reviewed by me and considered in my medical decision making (see chart for details).    MDM Rules/Calculators/A&P                          Diane Patel is a 39 y.o. female with a past medical history significant for sleep apnea, PCOS, obesity, hypertension, hyperlipidemia, GERD, depression, asthma, anxiety, and prior anemia who presents with continued nausea, vomiting, upper abdominal pain, and some chest discomfort.  Patient reports that she was seen several days ago for similar symptoms with nausea, vomiting,  and upper abdominal and left abdominal pain.  Patient had a CT scan that was reassuring with evidence of diverticulitis and she was felt to likely have reflux exacerbation.  She was able to feel better prior to discharge and went home.  She reports that since then, she has not been eating or drinking nearly anything due to the nausea persisting the last night started having vomiting again.  She reports she is having pain in her chest that is burning in her upper abdomen.  She says she does not have any left-sided lower abdominal pain or any other discomfort.  She denies any shortness of breath, palpitations, does report the burning is worse when she lays flat.  She denies new leg pain or leg swelling or other injuries.  She denies fevers or chills.  She denies any dysuria but does report urinary frequency which is new.  She denies diarrhea but does report she has had some mild constipation.  She is concerned she is getting dehydrated.  She says she was not given appropriate for nausea medicine at discharge recently and has been not keeping her Pepcid down well due to the vomiting.  She reports that she has not been back to see her PCP and has not afforded her medicine for her borderline diabetes and glucose problems.  On exam, lungs are clear and chest was nontender.  There was no murmur.  She had some mild abdominal tenderness in her left upper quadrant and epigastric area but no right upper quadrant tenderness.  She reports  her gallbladder has already been removed as has her appendix.  Bowel sounds were appreciated.  Legs are nontender and had pulses in all extremities.  Back was nontender.  No rashes seen.  Mouth did have dry mucous membranes.  Clinically I suspect she is having reflux type chest discomfort and upper abdominal discomfort related to the nausea, vomiting, and the burning type pain that was worse with laying flat in her chest.  However, given her obesity, hypertension hyperlipidemia, and this  chest discomfort, we will get EKG, troponin, and chest x-ray as well as give her GI cocktail.  We will get screening labs.  Given the recent reassuring CT scan, we agreed to hold on repeat abdominal imaging at this time.  We will give her some nausea medicine and fluids.   If work-up is reassuring and she is feeling better, anticipate this is still her gastritis and reflux causing her discomfort.  Anticipate giving her a prescription for nausea medicine as she can tolerate the Pepcid and give her GI follow-up.  Anticipate reassessment after work-up.   Patient's work-up was overall reassuring.  Suspect reflux and gastritis causing her symptoms.  Patient reports she already has Prilosec at home but was having nausea and vomiting.  Will give prescription for nausea medicine.  She will follow-up with her PCP and a GI doctor.  She agrees with plan of care and had no other questions or concerns.  Patient discharged in good condition after reassuring work-up and monitoring.  She was able to tolerate p.o. prior to discharge.  Final Clinical Impression(s) / ED Diagnoses Final diagnoses:  Dehydration  Non-intractable vomiting with nausea, unspecified vomiting type  Reflux gastritis    Rx / DC Orders ED Discharge Orders         Ordered    ondansetron (ZOFRAN) 4 MG tablet  Every 6 hours        01/06/20 1241         Clinical Impression: 1. Dehydration   2. Non-intractable vomiting with nausea, unspecified vomiting type   3. Reflux gastritis     Disposition: Discharge  Condition: Good  I have discussed the results, Dx and Tx plan with the pt(& family if present). He/she/they expressed understanding and agree(s) with the plan. Discharge instructions discussed at great length. Strict return precautions discussed and pt &/or family have verbalized understanding of the instructions. No further questions at time of discharge.    Discharge Medication List as of 01/06/2020 12:42 PM    START taking  these medications   Details  amLODipine (NORVASC) 5 MG tablet TAKE 1 TABLET BY MOUTH EVERY DAY, Normal    ondansetron (ZOFRAN) 4 MG tablet Take 1 tablet (4 mg total) by mouth every 6 (six) hours., Starting Thu 01/06/2020, Print        Follow Up: Simona Huh, NP [place] [address] [address] DEPT [place] [id number] [place] [phone]    Your GI team        Trinika Cortese, Gwenyth Allegra, MD 01/06/20 1600

## 2020-01-06 NOTE — ED Notes (Signed)
Pt given water and crackers for PO challenge

## 2020-01-06 NOTE — ED Notes (Signed)
Pt denies nausea and emesis since PO challenge

## 2020-01-06 NOTE — Discharge Instructions (Signed)
Your work-up today was overall reassuring.  Your labs are also reassuring as we discussed.  There was no evidence of a cardiac cause of the chest tightness that you had this morning and your x-ray was also reassuring.  I suspect you are not able to tolerate the Pepcid and other medications were given previously because of the nausea and vomiting.  Please use the new prescription for Zofran to help maintain hydration and take your other medicines.  Please call your GI team to see if he can follow-up sooner.  Please rest and stay hydrated.  If any symptoms change or worsen, please return to the nearest emergency department.

## 2020-01-07 LAB — URINE CULTURE: Culture: <10000 — AB

## 2020-01-11 ENCOUNTER — Encounter: Payer: No Typology Code available for payment source | Admitting: Physical Therapy

## 2020-01-11 ENCOUNTER — Other Ambulatory Visit (HOSPITAL_COMMUNITY): Payer: Self-pay | Admitting: Gastroenterology

## 2020-01-11 ENCOUNTER — Other Ambulatory Visit: Payer: Self-pay | Admitting: Gastroenterology

## 2020-01-11 DIAGNOSIS — R110 Nausea: Secondary | ICD-10-CM

## 2020-01-13 ENCOUNTER — Encounter: Payer: No Typology Code available for payment source | Admitting: Rehabilitative and Restorative Service Providers"

## 2020-02-02 ENCOUNTER — Ambulatory Visit (HOSPITAL_COMMUNITY): Payer: No Typology Code available for payment source

## 2020-02-02 ENCOUNTER — Encounter (HOSPITAL_COMMUNITY): Payer: Self-pay

## 2020-02-03 ENCOUNTER — Ambulatory Visit: Payer: Self-pay | Admitting: *Deleted

## 2020-02-03 NOTE — Telephone Encounter (Signed)
   Pt has questions about Covid and is requesting to speak to a nurse, she and her husband are positive and she is worried bc they both have GI and BP complications     Patient reports she noted onset of symptoms on 01/31/20. Positive for covid reported today. C/o sore throat, coughing. Bottom lip peeling. Denies difficulty breathing at time but has had to use inhaler due to hx asthma. Reviewed CDC guidelines for quarantine 5 days and wear a mask for around others 5 additional days. Patient requesting name submitted for [facility] infusion clinic. Instructed patient to increase water intake and monitor for signs of dehydration. Care advise given. Patient verbalized understanding of care advise and to call back or go to [facility] or ED if symptoms worsen.  Reason for Disposition . [1] [id number] diagnosed by positive lab test (e.g., PCR, rapid self-test kit) AND [2] mild symptoms (e.g., cough, fever, others) AND [3] no complications or SOB  Answer Assessment - Initial Assessment Questions 1. COVID-19 DIAGNOSIS: "Who made your COVID-19 diagnosis?" "Was it confirmed by a positive lab test?" If not diagnosed by a HCP, ask "Are there lots of cases (community spread) where you live?" Note: See public health department website, if unsure.     Dx made today  2. COVID-19 EXPOSURE: "Was there any known exposure to COVID before the symptoms began?" CDC Definition of close contact: within 6 feet (2 meters) for a total of 15 minutes or more over a 24-hour period.      Husband tested positive  3. ONSET: "When did the COVID-19 symptoms start?"      01/31/20 4. WORST SYMPTOM: "What is your worst symptom?" (e.g., cough, fever, shortness of breath, muscle aches)     Chest tightness, cough , body aches  5. COUGH: "Do you have a cough?" If Yes, ask: "How bad is the cough?"       Yes coughing up clear thick mucus  6. FEVER: "Do you have a fever?" If Yes, ask: "What is your temperature, how was it measured, and when did it start?"      no 7. RESPIRATORY STATUS: "Describe your breathing?" (e.g., shortness of breath, wheezing, unable to speak)     Wheezing  8. BETTER-SAME-WORSE: "Are you getting better, staying the same or getting worse compared to yesterday?"  If getting worse, ask, "In what way?"     Worse  9. HIGH RISK DISEASE: "Do you have any chronic medical problems?" (e.g., asthma, heart or lung disease, weak immune system, obesity, etc.)     Asthma, obesity , weak immune system 10. VACCINE: "Have you gotten the COVID-19 vaccine?" If Yes ask: "Which one, how many shots, when did you get it?"       Series pfizer and booster  11. PREGNANCY: "Is there any chance you are pregnant?" "When was your last menstrual period?"       na 12. OTHER SYMPTOMS: "Do you have any other symptoms?"  (e.g., chills, fatigue, headache, loss of smell or taste, muscle pain, sore throat; new loss of smell or taste especially support the diagnosis of COVID-19)       Chills, fatigue, slight headache, body aches.  Protocols used: CORONAVIRUS (COVID-19) DIAGNOSED OR SUSPECTED-A-AH

## 2020-05-19 ENCOUNTER — Ambulatory Visit: Payer: No Typology Code available for payment source | Admitting: Cardiology

## 2020-05-22 ENCOUNTER — Other Ambulatory Visit: Payer: Self-pay

## 2020-05-22 ENCOUNTER — Encounter: Payer: Self-pay | Admitting: Cardiology

## 2020-05-22 ENCOUNTER — Ambulatory Visit (INDEPENDENT_AMBULATORY_CARE_PROVIDER_SITE_OTHER): Payer: No Typology Code available for payment source | Admitting: Cardiology

## 2020-05-22 VITALS — BP 134/89 | HR 86 | Temp 98.3°F | Ht 62.0 in | Wt 303.0 lb

## 2020-05-22 DIAGNOSIS — I10 Essential (primary) hypertension: Secondary | ICD-10-CM

## 2020-05-22 DIAGNOSIS — E782 Mixed hyperlipidemia: Secondary | ICD-10-CM

## 2020-05-22 MED ORDER — LABETALOL HCL 100 MG PO TABS: 100.0000 mg | ORAL_TABLET | Freq: Two times a day (BID) | ORAL | 3 refills | Status: DC

## 2020-05-22 NOTE — Progress Notes (Signed)
Subjective:   Diane Patel, female    DOB: 17-May-1980, 40 y.o.   MRN: [id number]    Chief complaint:  Chest pain   HPI  40 y.o. African American female with obesity, atypical chest pain, hypertension  Pressures well controlled.  Patient is here with her husband today.  They are planning to initiate fertility treatment sometime this summer.  She is currently on amlodipine 5 mg daily, and hydrochlorothiazide 12.5 mg daily.  CT cardiac scoring is pending.  Current Outpatient Medications on File Prior to Visit  Medication Sig Dispense Refill  . acetaminophen (TYLENOL) 500 MG tablet Take 1 tablet (500 mg total) by mouth every 6 (six) hours as needed. (Patient taking differently: Take 500-1,000 mg by mouth every 6 (six) hours as needed for mild pain or headache. ) 30 tablet 0  . albuterol (PROVENTIL HFA;VENTOLIN HFA) 108 (90 Base) MCG/ACT inhaler Inhale 1-2 puffs into the lungs every 6 (six) hours as needed for wheezing or shortness of breath. 1 Inhaler 0  . amLODipine (NORVASC) 5 MG tablet TAKE 1 TABLET BY MOUTH EVERY DAY (Patient taking differently: Take 5 mg by mouth daily. ) 90 tablet 1  . Ascorbic Acid (VITAMIN C PO) Take 1 tablet by mouth daily.    . baclofen (LIORESAL) 10 MG tablet Take 0.5-1 tablets (5-10 mg total) by mouth 3 (three) times daily as needed for muscle spasms. (Patient not taking: Reported on 11/17/2019) 30 each 3  . clindamycin (CLEOCIN T) 1 % external solution Apply 1 application topically at bedtime.    . Fluocinolone Acetonide Body 0.01 % OIL Apply 1 application topically once a week.    . methocarbamol (ROBAXIN) 500 MG tablet Take 1 tablet (500 mg total) by mouth every 6 (six) hours as needed for muscle spasms. (Patient not taking: Reported on 01/06/2020) 60 tablet 3  . Multiple Vitamins-Minerals (MULTIVITAMIN WITH MINERALS) tablet Take 1 tablet by mouth daily.    . ondansetron (ZOFRAN) 4 MG tablet Take 1 tablet (4 mg total) by mouth every 6 (six) hours. 12  tablet 0  . pantoprazole (PROTONIX) 40 MG tablet Take 40 mg by mouth daily.    Marland Kitchen triamcinolone ointment (KENALOG) 0.1 % Apply 1 application topically 2 (two) times daily as needed (rash).      No current facility-administered medications on file prior to visit.    Cardiovascular studies:  EKG 05/22/2020: Sinus rhythm 88 bpm  Low voltage in precordial leads, otherwise normal EKG   Echocardiogram (Outside) 06/30/2013: Left ventricle: The cavity size was normal. Wall thickness wasincreased in a pattern of mild LVH. Systolic function was normal. The estimated ejection fraction was in the range of 55% to 60%.Wall motion was normal; there were no regional wall motionabnormalities. Left ventricular diastolic function parameterswere normal.  Sleep Study 2015: OSA on CPAP.  Recent labs: 10/18/2019: Chol 223, TG 75, HDL 46, LDL 164   09/17/2019: Glucose 107, BUN/Cr 11/0.7. EGFR >60. Na/K 140/3.8. Rest of the CMP normal Trop HS <2, <2 H/H 11.7/37.7. MCV 80. Platelets 307   04/2018: Chol 178, TG 63, HDL 42, LDL 123    Review of Systems  Cardiovascular: Negative for chest pain, dyspnea on exertion, leg swelling, palpitations and syncope.         Vitals:   05/22/20 1519 05/22/20 1520  BP: (!) 142/94 134/89  Pulse: 80 86  Temp: 98.3 F (36.8 C)      Objective:    Physical Exam Vitals and nursing note reviewed.  Constitutional:      General: She is not in acute distress. Neck:     Vascular: No JVD.  Cardiovascular:     Rate and Rhythm: Normal rate and regular rhythm.     Heart sounds: Normal heart sounds. No murmur heard.   Pulmonary:     Effort: Pulmonary effort is normal.     Breath sounds: Normal breath sounds. No wheezing or rales.           Assessment & Recommendations:   40 y.o. African American female with obesity, atypical chest pain, hypertension  Hypertension: Controlled on amlodipine 5 mg daily, hydrochlorothiazide 12.5 mg daily. However,  with plans for fertility treatment and possibility of pregnancy, I would like to switch her medications to medications that are relatively safer in pregnancy. Therefore, I stopped amlodipine and hydrochlorothiazide and started labetalol 100 mg twice daily. Continue weight loss efforts. I would like to stabilize her blood pressure before her pregnancy..  Chest pain: No recurrence.  CT cardiac scoring for risk stratification is pending.   Hyperlipidemia: LDL 164.  With upcoming pregnancy plans, will hold off statin initiation at this time.  Will reassess after pregnancy.    Fu in 4 weeks  [place], MD [facility] [place] Pager: [phone] Office: [phone] If no answer Cell [phone]

## 2020-05-29 ENCOUNTER — Other Ambulatory Visit: Payer: Self-pay | Admitting: Cardiology

## 2020-05-29 DIAGNOSIS — I10 Essential (primary) hypertension: Secondary | ICD-10-CM

## 2020-05-30 ENCOUNTER — Telehealth: Payer: Self-pay

## 2020-05-30 NOTE — Telephone Encounter (Signed)
I don't see any hypotension to suggest the dose is too high. 100 mg bid is the lowest dose. If there are severe side effects, we can try hydralazine or nifedipine as alternate options.  Thanks MJP

## 2020-05-30 NOTE — Telephone Encounter (Signed)
Patient called that she was started on labetalol on her last ov and she feels it's "too much for her" she has noticed she isn't able to sleep throughout the night and she has been sweating excessively lately and she has no appetite. Patient has been keeping a log on her bp readings Monday it was 127/80, last night it was 138/80 and today it was 142/94 patient states she hadn't taken the medication when she took this bp. She doesn't want to get off the medication completely but is wondering if it can be adjusted please advise.

## 2020-05-31 ENCOUNTER — Other Ambulatory Visit: Payer: Self-pay

## 2020-05-31 ENCOUNTER — Emergency Department (HOSPITAL_BASED_OUTPATIENT_CLINIC_OR_DEPARTMENT_OTHER)
Admission: EM | Admit: 2020-05-31 | Discharge: 2020-05-31 | Disposition: A | Discharge status: Home / Self Care | Payer: No Typology Code available for payment source | Attending: Emergency Medicine | Admitting: Emergency Medicine

## 2020-05-31 ENCOUNTER — Encounter (HOSPITAL_BASED_OUTPATIENT_CLINIC_OR_DEPARTMENT_OTHER): Payer: Self-pay

## 2020-05-31 DIAGNOSIS — R61 Generalized hyperhidrosis: Secondary | ICD-10-CM | POA: Diagnosis not present

## 2020-05-31 DIAGNOSIS — Z79899 Other long term (current) drug therapy: Secondary | ICD-10-CM | POA: Diagnosis not present

## 2020-05-31 DIAGNOSIS — M79601 Pain in right arm: Secondary | ICD-10-CM | POA: Insufficient documentation

## 2020-05-31 DIAGNOSIS — I10 Essential (primary) hypertension: Secondary | ICD-10-CM | POA: Insufficient documentation

## 2020-05-31 DIAGNOSIS — R5383 Other fatigue: Secondary | ICD-10-CM | POA: Diagnosis not present

## 2020-05-31 DIAGNOSIS — R630 Anorexia: Secondary | ICD-10-CM | POA: Insufficient documentation

## 2020-05-31 DIAGNOSIS — J45909 Unspecified asthma, uncomplicated: Secondary | ICD-10-CM | POA: Insufficient documentation

## 2020-05-31 LAB — URINALYSIS, MICROSCOPIC (REFLEX)
Bacteria, UA: NONE SEEN
RBC / HPF: >50 RBC/hpf (ref 0–5)

## 2020-05-31 LAB — CBC
HCT: 35.9 % — ABNORMAL LOW (ref 36.0–46.0)
Hemoglobin: 11.2 g/dL — ABNORMAL LOW (ref 12.0–15.0)
MCH: 23.9 pg — ABNORMAL LOW (ref 26.0–34.0)
MCHC: 31.2 g/dL (ref 30.0–36.0)
MCV: 76.7 fL — ABNORMAL LOW (ref 80.0–100.0)
Platelets: 358 10*3/uL (ref 150–400)
RBC: 4.68 MIL/uL (ref 3.87–5.11)
RDW: 15.4 % (ref 11.5–15.5)
WBC: 9.5 10*3/uL (ref 4.0–10.5)
nRBC: 0 % (ref 0.0–0.2)

## 2020-05-31 LAB — BASIC METABOLIC PANEL
Anion gap: 8 (ref 5–15)
BUN: 16 mg/dL (ref 6–20)
CO2: 22 mmol/L (ref 22–32)
Calcium: 9.1 mg/dL (ref 8.9–10.3)
Chloride: 107 mmol/L (ref 98–111)
Creatinine, Ser: 0.91 mg/dL (ref 0.44–1.00)
GFR, Estimated: >60 mL/min (ref >60)
Glucose, Bld: 83 mg/dL (ref 70–99)
Potassium: 3.8 mmol/L (ref 3.5–5.1)
Sodium: 137 mmol/L (ref 135–145)

## 2020-05-31 LAB — URINALYSIS, ROUTINE W REFLEX MICROSCOPIC

## 2020-05-31 LAB — PREGNANCY, URINE: Preg Test, Ur: NEGATIVE

## 2020-05-31 NOTE — ED Provider Notes (Signed)
[facility] Emergency Department Provider Note MRN:  [id number]  Arrival date & time: 05/31/20     Chief Complaint   Fatigue   History of Present Illness   Diane Patel is a 40 y.o. year-old female with a history of hypertension, obesity presenting to the ED with chief complaint of fatigue.  3 to 4 days of fatigue, night sweats last night, poor appetite likely medication, occasional right arm pain denies headache or vision change, no neck pain, no chest pain or shortness of breath, no abdominal pain.  Currently on her period, started yesterday.  Denies fever, no cough or cold-like symptoms, no burning with urination.  Explains that she and her husband are going to start trying to have a child, recently was switched from amlodipine to labetalol.  Review of Systems  A complete 10 system review of systems was obtained and all systems are negative except as noted in the HPI and PMH.   Patient's Health History    Past Medical History:  Diagnosis Date  . Allergy   . Anemia   . Anxiety   . Asthma   . Back pain    resolved, no current problems per patient 11/25/18  . Chest pain    no current problems per pt 11/25/18  . Depression   . Essential hypertension 04/15/2018  . GERD (gastroesophageal reflux disease)   . Headaches, cluster    resolved per patient 11/25/18, no longer a problem  . Hyperlipidemia   . Obesity, morbid ([place])   . OSA (obstructive sleep apnea) 01/2014   Does not use CPAP  . PCOS (polycystic ovarian syndrome)   . Pre-diabetes 11/2018   diet controlled/exercise, no meds, does not check sugar    Past Surgical History:  Procedure Laterality Date  . APPENDECTOMY  2004  . CHOLECYSTECTOMY  2010  . CHROMOPERTUBATION N/A 11/26/2018   Procedure: CHROMOPERTUBATION;  Surgeon: Sanjuana Kava, MD;  Location: [facility];  Service: Gynecology;  Laterality: N/A;  . COLONOSCOPY     polyps  . DILATATION & CURRETTAGE/HYSTEROSCOPY WITH RESECTOCOPE N/A  11/26/2018   Procedure: DILATATION & CURETTAGE/DIAGNOSTIC HYSTEROSCOPY WITH POLYPECTOMY;  Surgeon: Sanjuana Kava, MD;  Location: [facility];  Service: Gynecology;  Laterality: N/A;  . LAPAROSCOPY N/A 11/26/2018   Procedure: LAPAROSCOPY OPERATIVE;  Surgeon: Sanjuana Kava, MD;  Location: [facility];  Service: Gynecology;  Laterality: N/A;  . TONSILLECTOMY AND ADENOIDECTOMY  1985  . UPPER GI ENDOSCOPY    . WISDOM TOOTH EXTRACTION      Family History  Problem Relation Age of Onset  . Cancer Maternal Grandmother        ? stomach cancer  . Diabetes Maternal Grandmother   . Hypertension Mother   . Diabetes Mother   . Heart disease Mother   . Polycystic ovary syndrome Mother   . Cancer - Ovarian Mother   . Diabetes Father   . Hypertension Father   . Emphysema Father        smoked  . Diabetes Sister   . Pancreatitis Sister   . Cancer Maternal Grandfather        bone  . Glaucoma Maternal Grandfather   . Osteoporosis Maternal Grandfather   . Hypertension Paternal Grandfather   . Mental illness Paternal Grandfather   . Asthma Brother     Social History   Socioeconomic History  . Marital status: Married    Spouse name: Not on file  . Number of children: 0  . Years of education: coll  .  Highest education level: Not on file  Occupational History  . Occupation: unemployed  Tobacco Use  . Smoking status: Never Smoker  . Smokeless tobacco: Never Used  Vaping Use  . Vaping Use: Never used  Substance and Sexual Activity  . Alcohol use: No  . Drug use: No  . Sexual activity: Yes    Birth control/protection: None  Other Topics Concern  . Not on file  Social History Narrative   Regular exercise   Caffeine none.   Social Determinants of Health   Financial Resource Strain: Not on file  Food Insecurity: Not on file  Transportation Needs: Not on file  Physical Activity: Not on file  Stress: Not on file  Social Connections: Not on file  Intimate Partner Violence: Not on file     Physical  Exam   Vitals:   05/31/20 2017 05/31/20 2210  BP: (!) 150/91 134/81  Pulse: 75 63  Resp: 20 16  Temp: 98.3 F (36.8 C)   SpO2: 100% 100%    CONSTITUTIONAL: Well-appearing, NAD NEURO:  Alert and oriented x 3, no focal deficits EYES:  eyes equal and reactive ENT/NECK:  no LAD, no JVD CARDIO: Regular rate, well-perfused, normal S1 and S2 PULM:  CTAB no wheezing or rhonchi GI/GU:  normal bowel sounds, non-distended, non-tender MSK/SPINE:  No gross deformities, no edema SKIN:  no rash, atraumatic PSYCH:  Appropriate speech and behavior  *Additional and/or pertinent findings included in MDM below  Diagnostic and Interventional Summary    EKG Interpretation  Date/Time:  Wednesday May 31 2020 23:24:48 EDT Ventricular Rate:  72 PR Interval:  159 QRS Duration: 89 QT Interval:  399 QTC Calculation: 437 R Axis:   56 Text Interpretation: Sinus rhythm Low voltage, precordial leads Borderline T abnormalities, anterior leads Confirmed by Gerlene Fee [phone]) on 05/31/2020 11:35:43 PM      Labs Reviewed  CBC - Abnormal; Notable for the following components:      Result Value   Hemoglobin 11.2 (*)    HCT 35.9 (*)    MCV 76.7 (*)    MCH 23.9 (*)    All other components within normal limits  URINALYSIS, ROUTINE W REFLEX MICROSCOPIC - Abnormal; Notable for the following components:   Color, Urine RED (*)    APPearance HAZY (*)    Glucose, UA   (*)    Value: TEST NOT REPORTED DUE TO COLOR INTERFERENCE OF URINE PIGMENT   Hgb urine dipstick   (*)    Value: TEST NOT REPORTED DUE TO COLOR INTERFERENCE OF URINE PIGMENT   Bilirubin Urine   (*)    Value: TEST NOT REPORTED DUE TO COLOR INTERFERENCE OF URINE PIGMENT   Ketones, ur   (*)    Value: TEST NOT REPORTED DUE TO COLOR INTERFERENCE OF URINE PIGMENT   Protein, ur   (*)    Value: TEST NOT REPORTED DUE TO COLOR INTERFERENCE OF URINE PIGMENT   Nitrite   (*)    Value: TEST NOT REPORTED DUE TO COLOR INTERFERENCE OF URINE PIGMENT    Leukocytes,Ua   (*)    Value: TEST NOT REPORTED DUE TO COLOR INTERFERENCE OF URINE PIGMENT   All other components within normal limits  BASIC METABOLIC PANEL  PREGNANCY, URINE  URINALYSIS, MICROSCOPIC (REFLEX)    No orders to display    Medications - No data to display   Procedures  /  Critical Care Procedures  ED Course and Medical Decision Making  I have reviewed the triage  vital signs, the nursing notes, and pertinent available records from the EMR.  Listed above are laboratory and imaging tests that I personally ordered, reviewed, and interpreted and then considered in my medical decision making (see below for details).  Normal vital signs, no acute distress, sitting comfortably.  Vague symptoms that could be related to side effects from medication change versus viral illness importance which is felt to be less likely at this time given the lack of any other cough or cold-like symptoms, no fever.  Night sweats but no lymphadenopathy to the neck or supraclavicular region on my exam.  No abdominal tenderness.  Blood in urine but on menstrual cycle.  Overall no emergent process.  Having some right-sided arm pain that seems musculoskeletal.  Screening EKG is reassuring.  Advise follow-up with cardiology for further discussion of her symptoms and her hypertensive medications.       Barth Kirks. Sedonia Small, [place] [email or web address]  Final Clinical Impressions(s) / ED Diagnoses     ICD-10-CM   1. Fatigue, unspecified type  R53.83   2. Night sweat  R61   3. Poor appetite  R63.0     ED Discharge Orders    None       Discharge Instructions Discussed with and Provided to Patient:     Discharge Instructions     You were evaluated in the Emergency Department and after careful evaluation, we did not find any emergent condition requiring admission or further testing in the hospital.  Your exam/testing today was overall reassuring.   EKG was normal.  Your symptoms could be due to side effects related to your recent medication change.  Please discuss these symptoms further with your cardiologist.  Please return to the Emergency Department if you experience any worsening of your condition.  Thank you for allowing [place] to be a part of your care.        Maudie Flakes, MD 05/31/20 [phone]

## 2020-05-31 NOTE — Telephone Encounter (Signed)
Spoke to patient she is going to continue current medication

## 2020-05-31 NOTE — ED Triage Notes (Addendum)
Pt came in for generalized weakness and loss of appetite  - states she feels sluggish and tired and thirsty all the time  since her PCP  Changed her BP meds last week -  Unknown diabetic  - denies CP / SOB / dizzy   Pt came in ambulatory - alert and oriented

## 2020-05-31 NOTE — Discharge Instructions (Addendum)
You were evaluated in the Emergency Department and after careful evaluation, we did not find any emergent condition requiring admission or further testing in the hospital.  Your exam/testing today was overall reassuring.  EKG was normal.  Your symptoms could be due to side effects related to your recent medication change.  Please discuss these symptoms further with your cardiologist.  Please return to the Emergency Department if you experience any worsening of your condition.  Thank you for allowing [place] to be a part of your care.

## 2020-06-27 ENCOUNTER — Inpatient Hospital Stay: Admission: RE | Admit: 2020-06-27 | Payer: No Typology Code available for payment source | Source: Ambulatory Visit

## 2020-06-29 ENCOUNTER — Ambulatory Visit: Payer: No Typology Code available for payment source | Admitting: Cardiology

## 2020-07-11 ENCOUNTER — Other Ambulatory Visit: Payer: No Typology Code available for payment source

## 2020-07-18 ENCOUNTER — Other Ambulatory Visit: Payer: Self-pay | Admitting: Cardiology

## 2020-07-18 DIAGNOSIS — I10 Essential (primary) hypertension: Secondary | ICD-10-CM

## 2020-08-07 ENCOUNTER — Other Ambulatory Visit: Payer: Self-pay

## 2020-08-07 ENCOUNTER — Emergency Department (HOSPITAL_BASED_OUTPATIENT_CLINIC_OR_DEPARTMENT_OTHER): Payer: No Typology Code available for payment source | Admitting: Radiology

## 2020-08-07 ENCOUNTER — Emergency Department (HOSPITAL_BASED_OUTPATIENT_CLINIC_OR_DEPARTMENT_OTHER)
Admission: EM | Admit: 2020-08-07 | Discharge: 2020-08-07 | Disposition: A | Discharge status: Home / Self Care | Payer: No Typology Code available for payment source | Attending: Emergency Medicine | Admitting: Emergency Medicine

## 2020-08-07 DIAGNOSIS — W1839XA Other fall on same level, initial encounter: Secondary | ICD-10-CM | POA: Insufficient documentation

## 2020-08-07 DIAGNOSIS — Z79899 Other long term (current) drug therapy: Secondary | ICD-10-CM | POA: Diagnosis not present

## 2020-08-07 DIAGNOSIS — S6981XA Other specified injuries of right wrist, hand and finger(s), initial encounter: Secondary | ICD-10-CM

## 2020-08-07 DIAGNOSIS — I10 Essential (primary) hypertension: Secondary | ICD-10-CM | POA: Diagnosis not present

## 2020-08-07 DIAGNOSIS — S300XXA Contusion of lower back and pelvis, initial encounter: Secondary | ICD-10-CM | POA: Insufficient documentation

## 2020-08-07 DIAGNOSIS — S6991XA Unspecified injury of right wrist, hand and finger(s), initial encounter: Secondary | ICD-10-CM | POA: Diagnosis present

## 2020-08-07 DIAGNOSIS — J45909 Unspecified asthma, uncomplicated: Secondary | ICD-10-CM | POA: Insufficient documentation

## 2020-08-07 DIAGNOSIS — S63591A Other specified sprain of right wrist, initial encounter: Secondary | ICD-10-CM | POA: Insufficient documentation

## 2020-08-07 DIAGNOSIS — S20222A Contusion of left back wall of thorax, initial encounter: Secondary | ICD-10-CM

## 2020-08-07 DIAGNOSIS — W19XXXA Unspecified fall, initial encounter: Secondary | ICD-10-CM

## 2020-08-07 LAB — PREGNANCY, URINE: Preg Test, Ur: NEGATIVE

## 2020-08-07 NOTE — ED Triage Notes (Signed)
Patient reports to the ER after landing wrong during a mechanical fall. Patient reports right wrist pain, right ankle pain and back pain

## 2020-08-07 NOTE — ED Provider Notes (Addendum)
[facility] EMERGENC[facility] DEPT Provider Note   CSN: [id number] Arrival date & time: 08/07/20  1538     History Chief Complaint  Patient presents with   Diane Patel    Diane Patel is a 40 y.o. female.  Diane Patel is right-handed.  She was attempting to sit down in a chair 2 days ago, and she missed the chair.  The back of the chair grazed her lower back, and she fell with quite a bit of her body weight on her right hand.  Since that time she has had some pain in her left low back and in her right hand.  Pain is worse with lifting.  No neurologic symptoms.  The history is provided by the patient.  Fall This is a new problem. The current episode started 2 days ago. Episode frequency: one episode. The problem has been resolved. Pertinent negatives include no chest pain, no abdominal pain, no headaches and no shortness of breath. Exacerbated by: use of right hand. The symptoms are relieved by rest and ice. She has tried rest for the symptoms. The treatment provided mild relief.      Past Medical History:  Diagnosis Date   Allergy    Anemia    Anxiety    Asthma    Back pain    resolved, no current problems per patient 11/25/18   Chest pain    no current problems per pt 11/25/18   Depression    Essential hypertension 04/15/2018   GERD (gastroesophageal reflux disease)    Headaches, cluster    resolved per patient 11/25/18, no longer a problem   Hyperlipidemia    Obesity, morbid (HCC)    OSA (obstructive sleep apnea) 01/2014   Does not use CPAP   PCOS (polycystic ovarian syndrome)    Pre-diabetes 11/2018   diet controlled/exercise, no meds, does not check sugar    Patient Active Problem List   Diagnosis Date Noted   Precordial pain 05/21/2018   Essential hypertension 04/15/2018   Obesity hypoventilation syndrome ([place]) 04/27/2014   Morbid obesity ([place]) 04/27/2014   Nocturia more than twice per night 04/27/2014   Sleep related headaches 04/27/2014   Bruxism,  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   OSA (obstructive sleep apnea) 09/14/2013   Upper airway cough syndrome 07/27/2013   SOB (shortness of breath) 07/09/2013   Family history of systemic lupus erythematosus 07/09/2013   Left arm pain 06/21/2013   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   Edema 06/21/2013   Anemia 12/22/2012   Screening cholesterol level 12/22/2012   Migraine 12/21/2012   Asthma 07/13/2010   MENORRHAGIA 03/30/2010   ECZEMA 03/30/2010   DYSPEPSIA 09/05/2009   POLYCYSTIC OVARIAN DISEASE 09/04/2009   OBESITY 09/04/2009   DEPRESSION 09/04/2009   GERD 09/04/2009   FATIGUE 09/04/2009   Atypical chest pain 09/04/2009    Past Surgical History:  Procedure Laterality Date   APPENDECTOMY  2004   CHOLECYSTECTOMY  2010   CHROMOPERTUBATION N/A 11/26/2018   Procedure: CHROMOPERTUBATION;  Surgeon: Sanjuana Kava, MD;  Location: [place];  Service: Gynecology;  Laterality: N/A;   COLONOSCOPY     polyps   DILATATION & CURRETTAGE/HYSTEROSCOPY WITH RESECTOCOPE N/A 11/26/2018   Procedure: DILATATION & CURETTAGE/DIAGNOSTIC HYSTEROSCOPY WITH POLYPECTOMY;  Surgeon: Sanjuana Kava, MD;  Location: [place];  Service: Gynecology;  Laterality: N/A;   LAPAROSCOPY N/A 11/26/2018   Procedure: LAPAROSCOPY OPERATIVE;  Surgeon: Sanjuana Kava, MD;  Location: [place];  Service: Gynecology;  Laterality: N/A;   TONSILLECTOMY AND ADENOIDECTOMY  1985   UPPER GI ENDOSCOPY  WISDOM TOOTH EXTRACTION       OB History   No obstetric history on file.     Family History  Problem Relation Age of Onset   Cancer Maternal Grandmother        ? stomach cancer   Diabetes Maternal Grandmother    Hypertension Mother    Diabetes Mother    Heart disease Mother    Polycystic ovary syndrome Mother    Cancer - Ovarian Mother    Diabetes Father    Hypertension Father    Emphysema Father        smoked   Diabetes Sister    Pancreatitis Sister    Cancer Maternal Grandfather        bone   Glaucoma Maternal Grandfather    Osteoporosis  Maternal Grandfather    Hypertension Paternal Grandfather    Mental illness Paternal Grandfather    Asthma Brother     Social History   Tobacco Use   Smoking status: Never   Smokeless tobacco: Never  Vaping Use   Vaping Use: Never used  Substance Use Topics   Alcohol use: No   Drug use: No    Home Medications Prior to Admission medications   Medication Sig Start Date End Date Taking? Authorizing Provider  acetaminophen (TYLENOL) 500 MG tablet Take 1 tablet (500 mg total) by mouth every 6 (six) hours as needed. Patient taking differently: Take 500-1,000 mg by mouth every 6 (six) hours as needed for mild pain or headache. 04/13/19   Rodell Perna A, PA-C  albuterol (PROVENTIL HFA;VENTOLIN HFA) 108 (90 Base) MCG/ACT inhaler Inhale 1-2 puffs into the lungs every 6 (six) hours as needed for wheezing or shortness of breath. 04/18/17   Khatri, Hina, PA-C  labetalol (NORMODYNE) 100 MG tablet TAKE 1 TABLET BY MOUTH TWICE A DAY 05/29/20   Adrian Prows, MD  Multiple Vitamins-Minerals (MULTIVITAMIN WITH MINERALS) tablet Take 1 tablet by mouth daily.    [provider]  ondansetron (ZOFRAN) 4 MG tablet Take 1 tablet (4 mg total) by mouth every 6 (six) hours. 01/06/20   Tegeler, Gwenyth Allegra, MD  pantoprazole (PROTONIX) 40 MG tablet Take 40 mg by mouth daily.    [provider]  Vitamin D, Ergocalciferol, (DRISDOL) 1.25 MG (50000 UNIT) CAPS capsule Take 1 capsule by mouth once a week. 04/29/20   [provider]    Allergies    Ace inhibitors, Pineapple, Flexeril [cyclobenzaprine], Lactose intolerance (gi), Other, Chocolate, Cocoa, and Naproxen  Review of Systems   Review of Systems  Constitutional:  Negative for chills and fever.  HENT:  Negative for ear pain and sore throat.   Eyes:  Negative for pain and visual disturbance.  Respiratory:  Negative for cough and shortness of breath.   Cardiovascular:  Negative for chest pain and palpitations.  Gastrointestinal:  Negative  for abdominal pain and vomiting.  Genitourinary:  Negative for dysuria and hematuria.  Musculoskeletal:  Positive for back pain. Negative for arthralgias.  Skin:  Negative for color change and rash.  Neurological:  Negative for seizures, syncope and headaches.  All other systems reviewed and are negative.  Physical Exam Updated Vital Signs BP 121/72 (BP Location: Left Arm)   Pulse 83   Temp 98.3 F (36.8 C) (Oral)   Resp 16   Ht 5\' 2"  (1.575 m)   LMP 07/10/2020 (Exact Date)   SpO2 100%   BMI 55.24 kg/m   Physical Exam Vitals and nursing note reviewed.  HENT:  Head: Normocephalic and atraumatic.  Eyes:     General: No scleral icterus. Pulmonary:     Effort: Pulmonary effort is normal. No respiratory distress.  Musculoskeletal:     Cervical back: Normal range of motion. Tenderness present. Pain with movement present.       Back:     Comments: Spine is normal to inspection.  No deformity noted.  Mild tenderness to palpation at the left low lumbar region worse with gentle movement.  Gait is normal.  The right hand is somewhat diffusely swollen.  There is tenderness to palpation at the TFCC with increased pain with wrist range of motion.  Range of motion is slightly limited for all motion at the wrist.  Otherwise this extremity is warm and well perfused.  Normal capillary refill.  Right ankle is normal to inspection.    Skin:    General: Skin is warm and dry.  Neurological:     General: No focal deficit present.     Mental Status: She is alert and oriented to person, place, and time. Mental status is at baseline.  Psychiatric:        Mood and Affect: Mood normal.     ED Results / Procedures / Treatments   Labs (all labs ordered are listed, but only abnormal results are displayed) Labs Reviewed  PREGNANCY, URINE    EKG None  Radiology DG Lumbar Spine Complete  Result Date: 08/07/2020 CLINICAL DATA:  Back pain after fall.  Pain with ambulation. EXAM: LUMBAR SPINE  - COMPLETE 4+ VIEW COMPARISON:  Lumbar radiograph 06/25/2019 FINDINGS: Five lumbar type vertebra. The alignment is maintained. Vertebral body heights are normal. There is no listhesis. The posterior elements are intact. No fracture. Mild endplate spurring at K8-A0. Minimal L5-S1 disc space narrowing. Sacroiliac joints are symmetric and normal. IMPRESSION: 1. No acute fracture or subluxation of the lumbar spine. 2. Mild degenerative change. Electronically Signed   By: Keith Rake M.D.   On: 08/07/2020 17:17   DG Wrist Complete Right  Result Date: 08/07/2020 CLINICAL DATA:  Right wrist pain and swelling after fall. EXAM: RIGHT WRIST - COMPLETE 3+ VIEW COMPARISON:  None. FINDINGS: There is no evidence of fracture or dislocation. Minimal ulna minus variance. There is no evidence of arthropathy or other focal bone abnormality. Generalized soft tissue edema versus habitus. IMPRESSION: No fracture or subluxation of the right wrist. Electronically Signed   By: Keith Rake M.D.   On: 08/07/2020 17:15   DG Ankle Complete Right  Result Date: 08/07/2020 CLINICAL DATA:  Right ankle pain and swelling after fall. EXAM: RIGHT ANKLE - COMPLETE 3+ VIEW COMPARISON:  None. FINDINGS: There is no evidence of fracture, dislocation, or joint effusion. The ankle mortise is preserved. Normal hindfoot alignment. There is a moderate Achilles tendon enthesophyte. Mild generalized soft tissue edema IMPRESSION: 1. Soft tissue edema without acute fracture or subluxation. 2. Moderate Achilles tendon enthesophyte. Electronically Signed   By: Keith Rake M.D.   On: 08/07/2020 17:16    Procedures Procedures   Medications Ordered in ED Medications - No data to display  ED Course  I have reviewed the triage vital signs and the nursing notes.  Pertinent labs & imaging results that were available during my care of the patient were reviewed by me and considered in my medical decision making (see chart for details).    MDM  Rules/Calculators/A&P  Aela Bohan presented after a fall that happened 2 days ago.  X-rays were within normal limits.  She likely sprained her wrist, and I am concerned about a TFCC injury.  She was placed in a splint and asked to follow with orthopedics.  Otherwise, she can treat her back and ankle symptomatically. 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

## 2020-08-07 NOTE — Discharge Instructions (Addendum)
Wear the wrist splint at all times unless in water.  The Triangular FibroCartilage Complex, or TFCC, is an important structure in the wrist. The TFCC is made of tough fibrous tissue and cartilage. This tissue supports the joints between the end of the forearm bones (radius and ulna), adding to their stability. The TFCC also helps connect the forearm with the small bones in the ulnar side ("pinkie finger" side) of the wrist. There are several different tissues that form the TFCC, and they blend together to stabilize the ulnar side of the wrist.  The TFCC also acts as a cushion between the end of the ulna and small bones (lunate and triquetrum) of the wrist (Figure 1). In patients whose ulna is longer than the radius at the wrist, the TFCC is usually thinner and more likely to tear.  Figure 1 A TFCC tear involves the triangular fibrocartilage complex. Causes A TFCC tear can happen in two different locations and is usually caused by different problems. The first type of TFCC tear is due to natural wear, and the other is usually from injury. Tears due to wear are the most common and are usually not seen in younger people. They become more common as one gets older.  Tears from injury can come from:  A fall on the hand or wrist A twisting injury (like a drill bit catching, causing a twist of the arm) A fracture at the end of the radius Signs and Symptoms For some, a TFCC tear may not cause any pain or instability problems in a wrist. Often, MRI studies show tears in people with no pain or problems using the wrist.  Others may experience some or all of the following symptoms:  Clicking or popping while turning the forearm or moving the wrist from side to side Pain Weakness Limited motion If there is pain or instability in the wrist, it may be a sign of a problem with the TFCC. In this case, a discussion with your medical provider can help to clarify the issue.  Diagnosing a TFCC Tear It can be  difficult to find the cause of ulnar-sided wrist pain in the area of the TFCC since there are other conditions in this area that can cause similar problems. A hand surgeon may use special wrist examination methods to diagnose a TFCC tear.  Treatment Only people with the symptoms mentioned above need to be treated for a TFCC tear. Treatment options that may give relief are:  Splints Activity changes Anti-inflammatory medicine Injections If these don't help, there may be surgical treatments available. Possible surgical treatments are varied depending on the specific, underlying TFCC injury. Visit a hand surgeon to discuss your specific symptoms and work out a treatment plan.

## 2020-08-11 ENCOUNTER — Ambulatory Visit (INDEPENDENT_AMBULATORY_CARE_PROVIDER_SITE_OTHER): Payer: No Typology Code available for payment source | Admitting: Family Medicine

## 2020-08-11 ENCOUNTER — Encounter: Payer: Self-pay | Admitting: Family Medicine

## 2020-08-11 ENCOUNTER — Other Ambulatory Visit: Payer: Self-pay

## 2020-08-11 DIAGNOSIS — M25531 Pain in right wrist: Secondary | ICD-10-CM | POA: Diagnosis not present

## 2020-08-11 MED ORDER — DICLOFENAC SODIUM 1 % EX GEL: 4.0000 g | Freq: Four times a day (QID) | CUTANEOUS | 6 refills | Status: AC | PRN

## 2020-08-11 NOTE — Progress Notes (Signed)
Office Visit Note   Patient: Diane Patel           Date of Birth: [birth year]           MRN: [id number] Visit Date: 08/11/2020 Requested by: Simona Huh, NP [place],  [place] [id number] PCP: Simona Huh, NP  Subjective: Chief Complaint  Patient presents with   Right Wrist - Injury, Pain    Fell backward 08/05/20 hitting her back against the couch and landing with most of her weight on her right wrist. Martin Majestic to [place] at [facility] ED. Was placed in wrist splint and using biofreeze on it.    Lower Back - Pain, Injury   Left Knee - Pain, Injury    Did not have pain in the knee until a few days after the fall - did not have any when she went to the ED.   Right Ankle - Pain, Injury    Had xrays on 7/04 at the ED.     HPI: She is here with right wrist, low back and left knee pain.  About a week ago she fell backward hitting her back against the couch and landing with most of her weight on her right wrist.  She went to the ER and was given a wrist brace.  She continues to have pain on the ulnar side of her wrist although it is improved to some degree.  She also had low back pain after the fall.  She has tried a low-dose of muscle relaxant which helped her a little bit but she does not take anything on a regular basis.  No radicular symptoms.  Her left knee started hurting about 2 days ago, not sure if it is related to her fall.                ROS:   All other systems were reviewed and are negative.  Objective: Vital Signs: There were no vitals taken for this visit.  Physical Exam:  General:  Alert and oriented, in no acute distress. Pulm:  Breathing unlabored. Psy:  Normal mood, congruent affect. Skin: No bruising or abrasions Right wrist: Good range of motion but pain at the extremes, tender over the TFCC.  Negative TFC grind test.  No subluxation detectable of the ECU tendon.  Tendon functions are intact with resisted testing. Low back: Tender primarily over  the L5-S1 level. Left knee: 1+ patellofemoral crepitus, no effusion.  Slight tenderness on the medial joint line.  Ligaments feel stable.    Imaging: No results found.  Assessment & Plan: 1 week status post fall with right wrist pain, concerning for TFC injury -Trial of Voltaren gel.  Wrist brace as needed, minimize lifting with that wrist for 1 more week.  If symptoms persist, could consider a one-time cortisone injection.  If that did not help, then MRI arthrogram.  2.  Lumbar contusion - Trial of Voltaren gel.  3.  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  Follow-up as needed.     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   OSA (obstructive sleep apnea) 09/14/2013   Upper airway cough  syndrome 07/27/2013   SOB (shortness of breath) 07/09/2013   Family history of systemic lupus erythematosus 07/09/2013   Left arm pain 06/21/2013   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   Edema 06/21/2013   Anemia 12/22/2012   Screening cholesterol level 12/22/2012   Migraine 12/21/2012   Asthma 07/13/2010   MENORRHAGIA 03/30/2010   ECZEMA 03/30/2010   DYSPEPSIA 09/05/2009   POLYCYSTIC OVARIAN DISEASE 09/04/2009   OBESITY 09/04/2009   DEPRESSION 09/04/2009   GERD 09/04/2009   FATIGUE 09/04/2009   Atypical chest pain 09/04/2009   Past Medical History:  Diagnosis Date   Allergy    Anemia    Anxiety    Asthma    Back pain    resolved, no current problems per patient 11/25/18   Chest pain    no current problems per pt 11/25/18   Depression    Essential hypertension 04/15/2018   GERD (gastroesophageal reflux disease)     Headaches, cluster    resolved per patient 11/25/18, no longer a problem   Hyperlipidemia    Obesity, morbid (HCC)    OSA (obstructive sleep apnea) 01/2014   Does not use CPAP   PCOS (polycystic ovarian syndrome)    Pre-diabetes 11/2018   diet controlled/exercise, no meds, does not check sugar    Family History  Problem Relation Age of Onset   Cancer Maternal Grandmother        ? stomach cancer   Diabetes Maternal Grandmother    Hypertension Mother    Diabetes Mother    Heart disease Mother    Polycystic ovary syndrome Mother    Cancer - Ovarian Mother    Diabetes Father    Hypertension Father    Emphysema Father        smoked   Diabetes Sister    Pancreatitis Sister    Cancer Maternal Grandfather        bone   Glaucoma Maternal Grandfather    Osteoporosis Maternal Grandfather    Hypertension Paternal Grandfather    Mental illness Paternal Grandfather    Asthma Brother     Past Surgical History:  Procedure Laterality Date   APPENDECTOMY  2004   CHOLECYSTECTOMY  2010   CHROMOPERTUBATION N/A 11/26/2018   Procedure: CHROMOPERTUBATION;  Surgeon: Sanjuana Kava, MD;  Location: [place];  Service: Gynecology;  Laterality: N/A;   COLONOSCOPY     polyps   DILATATION & CURRETTAGE/HYSTEROSCOPY WITH RESECTOCOPE N/A 11/26/2018   Procedure: DILATATION & CURETTAGE/DIAGNOSTIC HYSTEROSCOPY WITH POLYPECTOMY;  Surgeon: Sanjuana Kava, MD;  Location: [facility];  Service: Gynecology;  Laterality: N/A;   LAPAROSCOPY N/A 11/26/2018   Procedure: LAPAROSCOPY OPERATIVE;  Surgeon: Sanjuana Kava, MD;  Location: [facility];  Service: Gynecology;  Laterality: N/A;   TONSILLECTOMY AND ADENOIDECTOMY  1985   UPPER GI ENDOSCOPY     WISDOM TOOTH EXTRACTION     Social History   Occupational History   Occupation: unemployed  Tobacco Use   Smoking status: Never   Smokeless tobacco: Never  Vaping Use   Vaping Use: Never used  Substance and Sexual Activity   Alcohol use: No   Drug use: No   Sexual activity: Yes     Birth control/protection: None

## 2020-08-29 ENCOUNTER — Other Ambulatory Visit: Payer: Self-pay | Admitting: Cardiology

## 2020-08-29 DIAGNOSIS — I10 Essential (primary) hypertension: Secondary | ICD-10-CM

## 2020-10-01 ENCOUNTER — Encounter (HOSPITAL_BASED_OUTPATIENT_CLINIC_OR_DEPARTMENT_OTHER): Payer: Self-pay | Admitting: Emergency Medicine

## 2020-10-01 ENCOUNTER — Emergency Department (HOSPITAL_BASED_OUTPATIENT_CLINIC_OR_DEPARTMENT_OTHER)
Admission: EM | Admit: 2020-10-01 | Discharge: 2020-10-01 | Disposition: A | Discharge status: Home / Self Care | Payer: No Typology Code available for payment source | Attending: Emergency Medicine | Admitting: Emergency Medicine

## 2020-10-01 ENCOUNTER — Emergency Department (HOSPITAL_BASED_OUTPATIENT_CLINIC_OR_DEPARTMENT_OTHER): Payer: No Typology Code available for payment source

## 2020-10-01 ENCOUNTER — Other Ambulatory Visit: Payer: Self-pay

## 2020-10-01 DIAGNOSIS — R630 Anorexia: Secondary | ICD-10-CM | POA: Diagnosis not present

## 2020-10-01 DIAGNOSIS — R531 Weakness: Secondary | ICD-10-CM | POA: Insufficient documentation

## 2020-10-01 DIAGNOSIS — R631 Polydipsia: Secondary | ICD-10-CM | POA: Insufficient documentation

## 2020-10-01 DIAGNOSIS — I10 Essential (primary) hypertension: Secondary | ICD-10-CM | POA: Diagnosis not present

## 2020-10-01 DIAGNOSIS — R5383 Other fatigue: Secondary | ICD-10-CM | POA: Insufficient documentation

## 2020-10-01 DIAGNOSIS — R110 Nausea: Secondary | ICD-10-CM | POA: Insufficient documentation

## 2020-10-01 DIAGNOSIS — Z20822 Contact with and (suspected) exposure to covid-19: Secondary | ICD-10-CM | POA: Insufficient documentation

## 2020-10-01 DIAGNOSIS — R0602 Shortness of breath: Secondary | ICD-10-CM | POA: Insufficient documentation

## 2020-10-01 DIAGNOSIS — R109 Unspecified abdominal pain: Secondary | ICD-10-CM | POA: Diagnosis not present

## 2020-10-01 DIAGNOSIS — J45909 Unspecified asthma, uncomplicated: Secondary | ICD-10-CM | POA: Diagnosis not present

## 2020-10-01 DIAGNOSIS — M545 Low back pain, unspecified: Secondary | ICD-10-CM | POA: Insufficient documentation

## 2020-10-01 LAB — COMPREHENSIVE METABOLIC PANEL
ALT: 12 U/L (ref 0–44)
AST: 14 U/L — ABNORMAL LOW (ref 15–41)
Albumin: 3.9 g/dL (ref 3.5–5.0)
Alkaline Phosphatase: 51 U/L (ref 38–126)
Anion gap: 7 (ref 5–15)
BUN: 10 mg/dL (ref 6–20)
CO2: 25 mmol/L (ref 22–32)
Calcium: 9.1 mg/dL (ref 8.9–10.3)
Chloride: 107 mmol/L (ref 98–111)
Creatinine, Ser: 0.78 mg/dL (ref 0.44–1.00)
GFR, Estimated: >60 mL/min (ref >60)
Glucose, Bld: 90 mg/dL (ref 70–99)
Potassium: 4 mmol/L (ref 3.5–5.1)
Sodium: 139 mmol/L (ref 135–145)
Total Bilirubin: 0.7 mg/dL (ref 0.3–1.2)
Total Protein: 7 g/dL (ref 6.5–8.1)

## 2020-10-01 LAB — URINALYSIS, ROUTINE W REFLEX MICROSCOPIC
Bilirubin Urine: NEGATIVE
Glucose, UA: NEGATIVE mg/dL
Hgb urine dipstick: NEGATIVE
Ketones, ur: NEGATIVE mg/dL
Leukocytes,Ua: NEGATIVE
Nitrite: NEGATIVE
Protein, ur: NEGATIVE mg/dL
Specific Gravity, Urine: 1.009 (ref 1.005–1.030)
pH: 7 (ref 5.0–8.0)

## 2020-10-01 LAB — CBC
HCT: 34.4 % — ABNORMAL LOW (ref 36.0–46.0)
Hemoglobin: 10.3 g/dL — ABNORMAL LOW (ref 12.0–15.0)
MCH: 22.3 pg — ABNORMAL LOW (ref 26.0–34.0)
MCHC: 29.9 g/dL — ABNORMAL LOW (ref 30.0–36.0)
MCV: 74.5 fL — ABNORMAL LOW (ref 80.0–100.0)
Platelets: 390 10*3/uL (ref 150–400)
RBC: 4.62 MIL/uL (ref 3.87–5.11)
RDW: 15.9 % — ABNORMAL HIGH (ref 11.5–15.5)
WBC: 8.5 10*3/uL (ref 4.0–10.5)
nRBC: 0 % (ref 0.0–0.2)

## 2020-10-01 LAB — CBG MONITORING, ED: Glucose-Capillary: 89 mg/dL (ref 70–99)

## 2020-10-01 LAB — RESP PANEL BY RT-PCR (FLU A&B, COVID) ARPGX2
Influenza A by PCR: NEGATIVE
Influenza B by PCR: NEGATIVE
SARS Coronavirus 2 by RT PCR: NEGATIVE

## 2020-10-01 LAB — BRAIN NATRIURETIC PEPTIDE: B Natriuretic Peptide: 44 pg/mL (ref 0.0–100.0)

## 2020-10-01 LAB — LIPASE, BLOOD: Lipase: 22 U/L (ref 11–51)

## 2020-10-01 LAB — TSH: TSH: 1.782 u[IU]/mL (ref 0.350–4.500)

## 2020-10-01 LAB — PREGNANCY, URINE: Preg Test, Ur: NEGATIVE

## 2020-10-01 LAB — MAGNESIUM: Magnesium: 2 mg/dL (ref 1.7–2.4)

## 2020-10-01 LAB — D-DIMER, QUANTITATIVE: D-Dimer, Quant: 0.34 ug/mL-FEU (ref 0.00–0.50)

## 2020-10-01 NOTE — ED Provider Notes (Signed)
[facility] EMERGENCY DEPT Provider Note   CSN: [id number] Arrival date & time: 10/01/20  0901     History Chief Complaint  Patient presents with   Abdominal Pain   Weakness    Diane Patel is a 40 y.o. female.  HPI Patient presents for multiple symptoms for past few days.  She describes polyuria, polydipsia, loss of appetite, back pain (from mid back to lower back), abdominal pain (described as a band across her mid abdomen), nausea without vomiting, fatigue, and generalized weakness.  Last bowel movement was yesterday and was firm in texture.  Last menstrual period was 6 weeks ago.  Patient states that the symptoms she has been experiencing have progressed slowly over the past 2 weeks and acutely worsened over the past few days.  She is followed by a cardiologist.  She takes HCTZ and labetalol for management of blood pressure.    Past Medical History:  Diagnosis Date   Allergy    Anemia    Anxiety    Asthma    Back pain    resolved, no current problems per patient 11/25/18   Chest pain    no current problems per pt 11/25/18   Depression    Essential hypertension 04/15/2018   GERD (gastroesophageal reflux disease)    Headaches, cluster    resolved per patient 11/25/18, no longer a problem   Hyperlipidemia    Obesity, morbid (HCC)    OSA (obstructive sleep apnea) 01/2014   Does not use CPAP   PCOS (polycystic ovarian syndrome)    Pre-diabetes 11/2018   diet controlled/exercise, no meds, does not check sugar    Patient Active Problem List   Diagnosis Date Noted   Precordial pain 05/21/2018   Essential hypertension 04/15/2018   Obesity hypoventilation syndrome ([place]) 04/27/2014   Morbid obesity ([place]) 04/27/2014   Nocturia more than twice per night 04/27/2014   Sleep related headaches 04/27/2014   Bruxism, 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   OSA (obstructive sleep apnea) 09/14/2013   Upper airway cough syndrome 07/27/2013   SOB (shortness of breath)  07/09/2013   Family history of systemic lupus erythematosus 07/09/2013   Left arm pain 06/21/2013   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   Edema 06/21/2013   Anemia 12/22/2012   Screening cholesterol level 12/22/2012   Migraine 12/21/2012   Asthma 07/13/2010   MENORRHAGIA 03/30/2010   ECZEMA 03/30/2010   DYSPEPSIA 09/05/2009   POLYCYSTIC OVARIAN DISEASE 09/04/2009   OBESITY 09/04/2009   DEPRESSION 09/04/2009   GERD 09/04/2009   FATIGUE 09/04/2009   Atypical chest pain 09/04/2009    Past Surgical History:  Procedure Laterality Date   APPENDECTOMY  2004   CHOLECYSTECTOMY  2010   CHROMOPERTUBATION N/A 11/26/2018   Procedure: CHROMOPERTUBATION;  Surgeon: Sanjuana Kava, MD;  Location: [facility];  Service: Gynecology;  Laterality: N/A;   COLONOSCOPY     polyps   DILATATION & CURRETTAGE/HYSTEROSCOPY WITH RESECTOCOPE N/A 11/26/2018   Procedure: DILATATION & CURETTAGE/DIAGNOSTIC HYSTEROSCOPY WITH POLYPECTOMY;  Surgeon: Sanjuana Kava, MD;  Location: [facility];  Service: Gynecology;  Laterality: N/A;   LAPAROSCOPY N/A 11/26/2018   Procedure: LAPAROSCOPY OPERATIVE;  Surgeon: Sanjuana Kava, MD;  Location: [facility];  Service: Gynecology;  Laterality: N/A;   TONSILLECTOMY AND ADENOIDECTOMY  1985   UPPER GI ENDOSCOPY     WISDOM TOOTH EXTRACTION       OB History   No obstetric history on file.     Family History  Problem Relation Age of Onset   Cancer Maternal Grandmother        ?  stomach cancer   Diabetes Maternal Grandmother    Hypertension Mother    Diabetes Mother    Heart disease Mother    Polycystic ovary syndrome Mother    Cancer - Ovarian Mother    Diabetes Father    Hypertension Father    Emphysema Father        smoked   Diabetes Sister    Pancreatitis Sister    Cancer Maternal Grandfather        bone   Glaucoma Maternal Grandfather    Osteoporosis Maternal Grandfather    Hypertension Paternal Grandfather    Mental illness Paternal Grandfather    Asthma Brother     Social History    Tobacco Use   Smoking status: Never   Smokeless tobacco: Never  Vaping Use   Vaping Use: Never used  Substance Use Topics   Alcohol use: No   Drug use: No    Home Medications Prior to Admission medications   Medication Sig Start Date End Date Taking? Authorizing Provider  acetaminophen (TYLENOL) 500 MG tablet Take 1 tablet (500 mg total) by mouth every 6 (six) hours as needed. Patient taking differently: Take 500-1,000 mg by mouth every 6 (six) hours as needed for mild pain or headache. 04/13/19   Rodell Perna A, PA-C  albuterol (PROVENTIL HFA;VENTOLIN HFA) 108 (90 Base) MCG/ACT inhaler Inhale 1-2 puffs into the lungs every 6 (six) hours as needed for wheezing or shortness of breath. 04/18/17   Khatri, Hina, PA-C  diclofenac Sodium (VOLTAREN) 1 % GEL Apply 4 g topically 4 (four) times daily as needed. 08/11/20   Hilts, Legrand Como, MD  labetalol (NORMODYNE) 100 MG tablet TAKE 1 TABLET BY MOUTH TWICE A DAY 08/30/20   Adrian Prows, MD  metFORMIN (GLUCOPHAGE) 500 MG tablet Take 500 mg by mouth daily. 08/01/20   [provider]  Multiple Vitamins-Minerals (MULTIVITAMIN WITH MINERALS) tablet Take 1 tablet by mouth daily.    [provider]  ondansetron (ZOFRAN) 4 MG tablet Take 1 tablet (4 mg total) by mouth every 6 (six) hours. 01/06/20   Tegeler, Gwenyth Allegra, MD  pantoprazole (PROTONIX) 40 MG tablet Take 40 mg by mouth daily.    [provider]  Vitamin D, Ergocalciferol, (DRISDOL) 1.25 MG (50000 UNIT) CAPS capsule Take 1 capsule by mouth once a week. 04/29/20   [provider]    Allergies    Ace inhibitors, Pineapple, Flexeril [cyclobenzaprine], Lactose intolerance (gi), Other, Chocolate, Cocoa, and Naproxen  Review of Systems   Review of Systems  Constitutional:  Positive for activity change, appetite change and fatigue. Negative for chills, diaphoresis and fever.  HENT:  Negative for congestion, ear pain, sinus pain, sore throat and trouble swallowing.    Eyes:  Negative for pain and visual disturbance.  Respiratory:  Negative for cough and shortness of breath.   Cardiovascular:  Negative for chest pain, palpitations and leg swelling.  Gastrointestinal:  Positive for abdominal pain and nausea. Negative for blood in stool, constipation, diarrhea and vomiting.  Endocrine: Positive for polydipsia and polyuria.  Genitourinary:  Negative for dysuria, flank pain, hematuria, pelvic pain, urgency, vaginal bleeding and vaginal discharge.  Musculoskeletal:  Positive for back pain. Negative for arthralgias, gait problem, joint swelling, myalgias and neck pain.  Skin:  Negative for color change and rash.  Neurological:  Positive for weakness (Generalized). Negative for dizziness, tremors, seizures, syncope, facial asymmetry, speech difficulty, light-headedness, numbness and headaches.  All other systems reviewed and are negative.  Physical Exam Updated  Vital Signs BP 117/70 (BP Location: Right Arm)   Pulse 80   Temp 98.4 F (36.9 C) (Oral)   Resp 16   LMP 08/22/2020 (Exact Date)   SpO2 97%   Physical Exam Vitals and nursing note reviewed.  Constitutional:      General: She is not in acute distress.    Appearance: She is well-developed. She is not ill-appearing, toxic-appearing or diaphoretic.  HENT:     Head: Normocephalic and atraumatic.     Mouth/Throat:     Mouth: Mucous membranes are moist.     Pharynx: Oropharynx is clear.  Eyes:     Conjunctiva/sclera: Conjunctivae normal.  Cardiovascular:     Rate and Rhythm: Normal rate and regular rhythm.     Heart sounds: No murmur heard. Pulmonary:     Effort: Pulmonary effort is normal. No respiratory distress.     Breath sounds: Normal breath sounds. No rhonchi or rales.  Chest:     Chest wall: No tenderness.  Abdominal:     Palpations: Abdomen is soft. There is no mass.     Tenderness: There is no abdominal tenderness. There is no right CVA tenderness or left CVA tenderness.   Musculoskeletal:     Cervical back: Neck supple.  Skin:    General: Skin is warm and dry.  Neurological:     General: No focal deficit present.     Mental Status: She is alert and oriented to person, place, and time.     Cranial Nerves: Cranial nerves are intact. No cranial nerve deficit, dysarthria or facial asymmetry.     Sensory: Sensation is intact. No sensory deficit.     Motor: Motor function is intact. No weakness, abnormal muscle tone or pronator drift.    ED Results / Procedures / Treatments   Labs (all labs ordered are listed, but only abnormal results are displayed) Labs Reviewed  COMPREHENSIVE METABOLIC PANEL - Abnormal; Notable for the following components:      Result Value   AST 14 (*)    All other components within normal limits  CBC - Abnormal; Notable for the following components:   Hemoglobin 10.3 (*)    HCT 34.4 (*)    MCV 74.5 (*)    MCH 22.3 (*)    MCHC 29.9 (*)    RDW 15.9 (*)    All other components within normal limits  URINALYSIS, ROUTINE W REFLEX MICROSCOPIC - Abnormal; Notable for the following components:   Color, Urine COLORLESS (*)    All other components within normal limits  RESP PANEL BY RT-PCR (FLU A&B, COVID) ARPGX2  LIPASE, BLOOD  PREGNANCY, URINE  MAGNESIUM  D-DIMER, QUANTITATIVE  BRAIN NATRIURETIC PEPTIDE  TSH  CBG MONITORING, ED    EKG EKG Interpretation  Date/Time:  Sunday October 01 2020 09:21:58 EDT Ventricular Rate:  74 PR Interval:  143 QRS Duration: 76 QT Interval:  388 QTC Calculation: 431 R Axis:   35 Text Interpretation: 41 yr-old Sinus rhythm Low voltage, precordial leads Confirmed by Godfrey Pick ([id number]) on 10/01/2020 10:02:14 AM  Radiology DG Chest Portable 1 View  Result Date: 10/01/2020 CLINICAL DATA:  Fatigue, weakness in legs. EXAM: PORTABLE CHEST 1 VIEW COMPARISON:  Chest x-ray dated 01/06/2020 FINDINGS: Heart size and mediastinal contours are within normal limits. Lungs are clear. No pleural effusion or  pneumothorax is seen. Osseous structures about the chest are unremarkable. IMPRESSION: No active disease. No evidence of pneumonia or pulmonary edema. Electronically Signed   By: Cherlynn Kaiser  Diane Patel M.D.   On: 10/01/2020 10:46    Procedures Procedures   Medications Ordered in ED Medications - No data to display  ED Course  I have reviewed the triage vital signs and the nursing notes.  Pertinent labs & imaging results that were available during my care of the patient were reviewed by me and considered in my medical decision making (see chart for details).    MDM Rules/Calculators/A&P                           Patient is a 40 year old female presenting for multiple symptoms over the past several weeks, worsening over the past several days.  She describes a fatigue, loss of appetite, abdominal pain, back pain, generalized weakness, intermittent nausea, polyuria, and polydipsia.  On exam, she is well-appearing.  She has no focal neurologic deficits.  She has good strength throughout extremities.  Vital signs are normal.  She has no significant areas of tenderness.  Patient does have hypertension, which does appear to be controlled at home medications.  She is also on metformin for blood sugar control.  Point-of-care glucose testing was 89.  Patient underwent laboratory work-up which showed normal electrolytes, normal hepatobiliary enzymes, no evidence of urine infection or glucosuria, negative pregnancy test, normal D-dimer and BNP, negative COVID/flu, no leukocytosis.  Patient has baseline macrocytic anemia.  Chest x-ray showed borderline cardiomegaly.  Per chart review, patient last had echocardiogram in 2015.  I did perform a bedside ultrasound of heart and lungs.  LVEF was grossly normal.  There were few B-lines in her lung fields.  Patient's recent symptoms could be related to developing heart failure.  Given her mild symptoms, patient is appropriate for discharge.  She is followed by cardiologist and  was advised to schedule a follow-up appointment with cardiology for further outpatient work-up.  Patient was agreeable to this.  She was discharged in stable condition.  Final Clinical Impression(s) / ED Diagnoses Final diagnoses:  Fatigue, unspecified type    Rx / DC Orders ED Discharge Orders     None        Godfrey Pick, MD 10/02/20 [phone]

## 2020-10-01 NOTE — ED Triage Notes (Signed)
Pt reports weakness in legs for past few days, along with abdominal pain that has persisted for months and bilateral lower back pain.

## 2020-10-01 NOTE — Discharge Instructions (Addendum)
Follow-up with your primary care doctor and your cardiologist for further testing and medication adjustments.  Return to the ED for any worsening symptoms

## 2020-10-01 NOTE — ED Notes (Signed)
Dr Dixon in room w/pt now. 

## 2020-10-03 NOTE — Progress Notes (Signed)
Subjective:   Diane Patel, female    DOB: [birth year], 40 y.o.   MRN: [id number]    Chief complaint:  Chest pain   HPI  40 y.o. African American female with obesity, atypical chest pain, hypertension  Patient was recently seen in the emergency room with complaints of loss of appetite, abdominal pain, generalized weakness, intermittent nausea, polyuria, polydipsia.  Chest x-ray showed few B-lines.  She was told to have possible early development of heart failure, and was asked to follow-up with me.  Patient states that she has had generalized fatigue, shortness of breath, tingling and pain in all her fingers and legs. Blood pressure is elevated.   Current Outpatient Medications on File Prior to Visit  Medication Sig Dispense Refill   acetaminophen (TYLENOL) 500 MG tablet Take 1 tablet (500 mg total) by mouth every 6 (six) hours as needed. (Patient taking differently: Take 500-1,000 mg by mouth every 6 (six) hours as needed for mild pain or headache.) 30 tablet 0   albuterol (PROVENTIL HFA;VENTOLIN HFA) 108 (90 Base) MCG/ACT inhaler Inhale 1-2 puffs into the lungs every 6 (six) hours as needed for wheezing or shortness of breath. 1 Inhaler 0   diclofenac Sodium (VOLTAREN) 1 % GEL Apply 4 g topically 4 (four) times daily as needed. 500 g 6   labetalol (NORMODYNE) 100 MG tablet TAKE 1 TABLET BY MOUTH TWICE A DAY 180 tablet 1   metFORMIN (GLUCOPHAGE) 500 MG tablet Take 500 mg by mouth daily.     Multiple Vitamins-Minerals (MULTIVITAMIN WITH MINERALS) tablet Take 1 tablet by mouth daily.     ondansetron (ZOFRAN) 4 MG tablet Take 1 tablet (4 mg total) by mouth every 6 (six) hours. 12 tablet 0   pantoprazole (PROTONIX) 40 MG tablet Take 40 mg by mouth daily.     Vitamin D, Ergocalciferol, (DRISDOL) 1.25 MG (50000 UNIT) CAPS capsule Take 1 capsule by mouth once a week.     No current facility-administered medications on file prior to visit.    Cardiovascular studies:  EKG  05/22/2020: Sinus rhythm 88 bpm  Low voltage in precordial leads, otherwise normal EKG    Echocardiogram (Outside) 06/30/2013: Left ventricle: The cavity size was normal. Wall thickness was increased in a pattern of mild LVH. Systolic function was normal. The estimated ejection fraction was in the range of 55% to 60%. Wall motion was normal; there were no regional wall motion abnormalities. Left ventricular diastolic function parameters were normal.   Sleep Study 2015: OSA on CPAP.   Recent labs: 10/01/2020: Glucose 90, BUN/Cr 10/0.78. EGFR >60. Na/K 139/4.0. Rest of the CMP normal H/H 10/34. MCV 74. Platelets 390 BNP 44 TSH 1.7 normal   10/18/2019: Chol 223, TG 75, HDL 46, LDL 164   09/17/2019: Glucose 107, BUN/Cr 11/0.7. EGFR >60. Na/K 140/3.8. Rest of the CMP normal Trop HS <2, <2 H/H 11.7/37.7. MCV 80. Platelets 307   04/2018: Chol 178, TG 63, HDL 42, LDL 123    Review of Systems  Cardiovascular:  Negative for chest pain, dyspnea on exertion, leg swelling, palpitations and syncope.        Vitals:   10/04/20 0909  BP: (!) 142/94  Pulse: 84  Resp: 16  Temp: 98.7 F (37.1 C)  SpO2: 98%     Objective:    Physical Exam Vitals and nursing note reviewed.  Constitutional:      General: She is not in acute distress. Neck:     Vascular: No JVD.  Cardiovascular:     Rate and Rhythm: Normal rate and regular rhythm.     Heart sounds: Normal heart sounds. No murmur heard. Pulmonary:     Effort: Pulmonary effort is normal.     Breath sounds: Normal breath sounds. No wheezing or rales.          Assessment & Recommendations:   40 y.o. African American female with obesity, atypical chest pain, hypertension  Hypertension: Previously on amlodipine 5 mg daily, hydrochlorothiazide 12.5 mg daily. However, with plans for fertility treatment and possibility of pregnancy, switched her to labetalol 100 mg twice daily. I will further increase this today to labetalol 200  mg bid. Continue weight loss efforts. Given her new shortness of breath, will obtain echocardiogram. Added lasix 20 mg for as needed use.  Hyperlipidemia: LDL 164.  With upcoming pregnancy plans, will hold off statin initiation at this time.  Will reassess after pregnancy.    Fu in 4 weeks  [place], MD [facility] Pager: [phone] Office: [phone] If no answer Cell [phone]

## 2020-10-04 ENCOUNTER — Encounter: Payer: Self-pay | Admitting: Cardiology

## 2020-10-04 ENCOUNTER — Other Ambulatory Visit: Payer: Self-pay

## 2020-10-04 ENCOUNTER — Ambulatory Visit (INDEPENDENT_AMBULATORY_CARE_PROVIDER_SITE_OTHER): Payer: No Typology Code available for payment source | Admitting: Cardiology

## 2020-10-04 VITALS — BP 142/94 | HR 84 | Temp 98.7°F | Resp 16 | Ht 62.0 in | Wt 294.0 lb

## 2020-10-04 DIAGNOSIS — I10 Essential (primary) hypertension: Secondary | ICD-10-CM

## 2020-10-04 DIAGNOSIS — E782 Mixed hyperlipidemia: Secondary | ICD-10-CM

## 2020-10-04 MED ORDER — FUROSEMIDE 20 MG PO TABS: 20.0000 mg | ORAL_TABLET | Freq: Every day | ORAL | 2 refills | Status: DC | PRN

## 2020-10-04 MED ORDER — LABETALOL HCL 200 MG PO TABS: 200.0000 mg | ORAL_TABLET | Freq: Two times a day (BID) | ORAL | 3 refills | Status: DC

## 2020-10-11 ENCOUNTER — Ambulatory Visit: Payer: No Typology Code available for payment source

## 2020-10-11 ENCOUNTER — Other Ambulatory Visit: Payer: Self-pay

## 2020-10-11 DIAGNOSIS — I10 Essential (primary) hypertension: Secondary | ICD-10-CM

## 2020-10-17 ENCOUNTER — Telehealth: Payer: Self-pay

## 2020-10-17 NOTE — Telephone Encounter (Signed)
Pt called to get results of echo done-results given but she also wanted to discuss recent find at ED regarding fluid buildup. Please call to further discuss//ah

## 2020-10-30 ENCOUNTER — Other Ambulatory Visit: Payer: Self-pay

## 2020-10-30 ENCOUNTER — Encounter: Payer: Self-pay | Admitting: Cardiology

## 2020-10-30 ENCOUNTER — Ambulatory Visit: Payer: No Typology Code available for payment source | Admitting: Cardiology

## 2020-10-30 VITALS — BP 131/79 | HR 96 | Temp 98.1°F | Resp 16 | Ht 62.0 in | Wt 292.0 lb

## 2020-10-30 DIAGNOSIS — I10 Essential (primary) hypertension: Secondary | ICD-10-CM

## 2020-10-30 DIAGNOSIS — E782 Mixed hyperlipidemia: Secondary | ICD-10-CM

## 2020-10-30 MED ORDER — LABETALOL HCL 200 MG PO TABS: 200.0000 mg | ORAL_TABLET | Freq: Two times a day (BID) | ORAL | 3 refills | Status: AC

## 2020-10-30 NOTE — Progress Notes (Signed)
Subjective:   Diane Patel, female    DOB: 24-Jan-1981, 40 y.o.   MRN: [id number]    Chief complaint:  Chest pain   HPI  40 y.o. African American female with obesity, atypical chest pain, hypertension  Patient is feeling better, blood pressure has improved. Infertility treatment is awaiting further weight loss. Recent echocardiogram results reviewed with the patient, details below.    Current Outpatient Medications on File Prior to Visit  Medication Sig Dispense Refill   acetaminophen (TYLENOL) 500 MG tablet Take 1 tablet (500 mg total) by mouth every 6 (six) hours as needed. (Patient taking differently: Take 500-1,000 mg by mouth every 6 (six) hours as needed for mild pain or headache.) 30 tablet 0   albuterol (PROVENTIL HFA;VENTOLIN HFA) 108 (90 Base) MCG/ACT inhaler Inhale 1-2 puffs into the lungs every 6 (six) hours as needed for wheezing or shortness of breath. 1 Inhaler 0   diclofenac Sodium (VOLTAREN) 1 % GEL Apply 4 g topically 4 (four) times daily as needed. 500 g 6   furosemide (LASIX) 20 MG tablet Take 1 tablet (20 mg total) by mouth daily as needed. For more than usual leg/hand swelling 60 tablet 2   labetalol (NORMODYNE) 200 MG tablet Take 1 tablet (200 mg total) by mouth 2 (two) times daily. 60 tablet 3   metFORMIN (GLUCOPHAGE) 500 MG tablet Take 500 mg by mouth daily.     Multiple Vitamins-Minerals (MULTIVITAMIN WITH MINERALS) tablet Take 1 tablet by mouth daily.     ondansetron (ZOFRAN) 4 MG tablet Take 1 tablet (4 mg total) by mouth every 6 (six) hours. 12 tablet 0   pantoprazole (PROTONIX) 40 MG tablet Take 40 mg by mouth daily.     Vitamin D, Ergocalciferol, (DRISDOL) 1.25 MG (50000 UNIT) CAPS capsule Take 1 capsule by mouth once a week.     vitamin E 1000 UNIT capsule Take 1,000 Units by mouth daily.     No current facility-administered medications on file prior to visit.    Cardiovascular studies:  EKG 05/22/2020: Sinus rhythm 88 bpm  Low voltage in  precordial leads, otherwise normal EKG    Echocardiogram (Outside) 06/30/2013: Left ventricle: The cavity size was normal. Wall thickness was increased in a pattern of mild LVH. Systolic function was normal. The estimated ejection fraction was in the range of 55% to 60%. Wall motion was normal; there were no regional wall motion abnormalities. Left ventricular diastolic function parameters were normal.   Sleep Study 2015: OSA on CPAP.   Recent labs: 10/01/2020: Glucose 90, BUN/Cr 10/0.78. EGFR >60. Na/K 139/4.0. Rest of the CMP normal H/H 10/34. MCV 74. Platelets 390 BNP 44 TSH 1.7 normal   10/18/2019: Chol 223, TG 75, HDL 46, LDL 164   09/17/2019: Glucose 107, BUN/Cr 11/0.7. EGFR >60. Na/K 140/3.8. Rest of the CMP normal Trop HS <2, <2 H/H 11.7/37.7. MCV 80. Platelets 307   04/2018: Chol 178, TG 63, HDL 42, LDL 123    Review of Systems  Cardiovascular:  Negative for chest pain, dyspnea on exertion, leg swelling, palpitations and syncope.        Vitals:   10/30/20 1307  BP: 131/79  Pulse: 96  Resp: 16  Temp: 98.1 F (36.7 C)  SpO2: 98%     Objective:    Physical Exam Vitals and nursing note reviewed.  Constitutional:      General: She is not in acute distress. Neck:     Vascular: No JVD.  Cardiovascular:  Rate and Rhythm: Normal rate and regular rhythm.     Heart sounds: Normal heart sounds. No murmur heard. Pulmonary:     Effort: Pulmonary effort is normal.     Breath sounds: Normal breath sounds. No wheezing or rales.          Assessment & Recommendations:   40 y.o. African American female with obesity, atypical chest pain, hypertension  Hypertension: Previously on amlodipine 5 mg daily, hydrochlorothiazide 12.5 mg daily. However, with plans for fertility treatment and possibility of pregnancy, switched her to labetalol 100 mg twice daily. Well controlled on labetalol 200 mg bid. Continue weight loss efforts. Continue lasix 20 mg for as  needed use.  Hyperlipidemia: LDL 164.  With upcoming pregnancy plans, will hold off statin initiation at this time. Continue weight loss efforts. If lipids remain elevated, will add statin in future.  Repeat lipid panel in 3 months Fu in 3 months  Leveda Kendrix Esther Hardy, MD [facility] Pager: [phone] Office: [phone] If no answer Cell [phone]

## 2020-11-28 ENCOUNTER — Other Ambulatory Visit: Payer: Self-pay | Admitting: Obstetrics & Gynecology

## 2020-12-05 ENCOUNTER — Other Ambulatory Visit: Payer: Self-pay | Admitting: Obstetrics & Gynecology

## 2020-12-05 DIAGNOSIS — R928 Other abnormal and inconclusive findings on diagnostic imaging of breast: Secondary | ICD-10-CM

## 2020-12-09 ENCOUNTER — Other Ambulatory Visit: Payer: Self-pay

## 2020-12-09 ENCOUNTER — Ambulatory Visit
Admission: RE | Admit: 2020-12-09 | Discharge: 2020-12-09 | Disposition: A | Discharge status: Home / Self Care | Payer: No Typology Code available for payment source | Source: Ambulatory Visit | Attending: Obstetrics & Gynecology | Admitting: Obstetrics & Gynecology

## 2020-12-09 ENCOUNTER — Other Ambulatory Visit: Payer: Self-pay | Admitting: Obstetrics & Gynecology

## 2020-12-09 DIAGNOSIS — R928 Other abnormal and inconclusive findings on diagnostic imaging of breast: Secondary | ICD-10-CM

## 2020-12-25 ENCOUNTER — Other Ambulatory Visit: Payer: No Typology Code available for payment source

## 2020-12-28 ENCOUNTER — Other Ambulatory Visit: Payer: Self-pay | Admitting: Cardiology

## 2020-12-28 DIAGNOSIS — I10 Essential (primary) hypertension: Secondary | ICD-10-CM

## 2021-01-08 ENCOUNTER — Other Ambulatory Visit: Payer: Self-pay | Admitting: Obstetrics & Gynecology

## 2021-01-08 ENCOUNTER — Ambulatory Visit
Admission: RE | Admit: 2021-01-08 | Discharge: 2021-01-08 | Disposition: A | Discharge status: Home / Self Care | Payer: No Typology Code available for payment source | Source: Ambulatory Visit | Attending: Obstetrics & Gynecology | Admitting: Obstetrics & Gynecology

## 2021-01-08 DIAGNOSIS — R928 Other abnormal and inconclusive findings on diagnostic imaging of breast: Secondary | ICD-10-CM

## 2021-01-19 ENCOUNTER — Ambulatory Visit: Admit: 2021-01-19 | Payer: No Typology Code available for payment source | Admitting: Obstetrics & Gynecology

## 2021-01-19 SURGERY — CHROMOPERTUBATION, FALLOPIAN TUBE
Anesthesia: Choice

## 2021-01-30 ENCOUNTER — Other Ambulatory Visit: Payer: Self-pay | Admitting: Obstetrics & Gynecology

## 2021-02-02 ENCOUNTER — Ambulatory Visit: Payer: No Typology Code available for payment source | Admitting: Cardiology

## 2021-02-08 ENCOUNTER — Ambulatory Visit: Payer: No Typology Code available for payment source | Admitting: Cardiology

## 2021-02-08 NOTE — Progress Notes (Deleted)
Subjective:   Diane Patel, female    DOB: 1980-08-02, 41 y.o.   MRN: [id number]    Chief complaint:  Chest pain   HPI  41 y.o. African American female with obesity, atypical chest pain, hypertension  Patient is feeling better, blood pressure has improved. Infertility treatment is awaiting further weight loss. Recent echocardiogram results reviewed with the patient, details below.    Current Outpatient Medications on File Prior to Visit  Medication Sig Dispense Refill   acetaminophen (TYLENOL) 500 MG tablet Take 1 tablet (500 mg total) by mouth every 6 (six) hours as needed. (Patient taking differently: Take 500-1,000 mg by mouth every 6 (six) hours as needed for mild pain or headache.) 30 tablet 0   albuterol (PROVENTIL HFA;VENTOLIN HFA) 108 (90 Base) MCG/ACT inhaler Inhale 1-2 puffs into the lungs every 6 (six) hours as needed for wheezing or shortness of breath. 1 Inhaler 0   diclofenac Sodium (VOLTAREN) 1 % GEL Apply 4 g topically 4 (four) times daily as needed. 500 g 6   ferrous sulfate 324 MG TBEC Take 1 tablet by mouth daily.     furosemide (LASIX) 20 MG tablet TAKE 1 TABLET (20 MG TOTAL) BY MOUTH DAILY AS NEEDED. FOR MORE THAN USUAL LEG/HAND SWELLING 180 tablet 0   labetalol (NORMODYNE) 200 MG tablet Take 1 tablet (200 mg total) by mouth 2 (two) times daily. 180 tablet 3   metFORMIN (GLUCOPHAGE) 500 MG tablet Take 500 mg by mouth daily.     ondansetron (ZOFRAN) 4 MG tablet Take 1 tablet (4 mg total) by mouth every 6 (six) hours. 12 tablet 0   pantoprazole (PROTONIX) 40 MG tablet Take 40 mg by mouth daily.     vitamin E 1000 UNIT capsule Take 1,000 Units by mouth daily.     No current facility-administered medications on file prior to visit.    Cardiovascular studies:  EKG 05/22/2020: Sinus rhythm 88 bpm  Low voltage in precordial leads, otherwise normal EKG    Echocardiogram (Outside) 06/30/2013: Left ventricle: The cavity size was normal. Wall thickness was  increased in a pattern of mild LVH. Systolic function was normal. The estimated ejection fraction was in the range of 55% to 60%. Wall motion was normal; there were no regional wall motion abnormalities. Left ventricular diastolic function parameters were normal.   Sleep Study 2015: OSA on CPAP.   Recent labs: 10/01/2020: Glucose 90, BUN/Cr 10/0.78. EGFR >60. Na/K 139/4.0. Rest of the CMP normal H/H 10/34. MCV 74. Platelets 390 BNP 44 TSH 1.7 normal   10/18/2019: Chol 223, TG 75, HDL 46, LDL 164   09/17/2019: Glucose 107, BUN/Cr 11/0.7. EGFR >60. Na/K 140/3.8. Rest of the CMP normal Trop HS <2, <2 H/H 11.7/37.7. MCV 80. Platelets 307   04/2018: Chol 178, TG 63, HDL 42, LDL 123    Review of Systems  Cardiovascular:  Negative for chest pain, dyspnea on exertion, leg swelling, palpitations and syncope.        There were no vitals filed for this visit.    Objective:    Physical Exam Vitals and nursing note reviewed.  Constitutional:      General: She is not in acute distress. Neck:     Vascular: No JVD.  Cardiovascular:     Rate and Rhythm: Normal rate and regular rhythm.     Heart sounds: Normal heart sounds. No murmur heard. Pulmonary:     Effort: Pulmonary effort is normal.     Breath sounds:  Normal breath sounds. No wheezing or rales.          Assessment & Recommendations:   41 y.o. African American female with obesity, atypical chest pain, hypertension  ***Hypertension: Previously on amlodipine 5 mg daily, hydrochlorothiazide 12.5 mg daily. However, with plans for fertility treatment and possibility of pregnancy, switched her to labetalol 100 mg twice daily. Well controlled on labetalol 200 mg bid. Continue weight loss efforts. Continue lasix 20 mg for as needed use.  Hyperlipidemia: LDL 164.  With upcoming pregnancy plans, will hold off statin initiation at this time. Continue weight loss efforts. If lipids remain elevated, will add statin in  future.  ***Repeat lipid panel in 3 months ***F/u in 3 months  Ajene Carchi Esther Hardy, MD [facility] Pager: [phone] Office: [phone] If no answer Cell [phone]

## 2021-03-14 ENCOUNTER — Ambulatory Visit (HOSPITAL_BASED_OUTPATIENT_CLINIC_OR_DEPARTMENT_OTHER): Admit: 2021-03-14 | Payer: No Typology Code available for payment source | Admitting: Obstetrics & Gynecology

## 2021-03-14 ENCOUNTER — Encounter (HOSPITAL_BASED_OUTPATIENT_CLINIC_OR_DEPARTMENT_OTHER): Payer: Self-pay

## 2021-03-14 SURGERY — CHROMOPERTUBATION, FALLOPIAN TUBE
Anesthesia: Regional

## 2021-03-26 ENCOUNTER — Other Ambulatory Visit: Payer: Self-pay | Admitting: Obstetrics & Gynecology

## 2021-05-24 ENCOUNTER — Ambulatory Visit: Admit: 2021-05-24 | Payer: No Typology Code available for payment source | Admitting: Obstetrics & Gynecology

## 2021-05-24 SURGERY — LAPAROSCOPY, DIAGNOSTIC
Anesthesia: General

## 2021-06-07 ENCOUNTER — Other Ambulatory Visit: Payer: Self-pay | Admitting: Obstetrics & Gynecology

## 2021-06-07 DIAGNOSIS — N6320 Unspecified lump in the left breast, unspecified quadrant: Secondary | ICD-10-CM

## 2021-06-30 ENCOUNTER — Other Ambulatory Visit: Payer: Self-pay | Admitting: Cardiology

## 2021-06-30 DIAGNOSIS — I10 Essential (primary) hypertension: Secondary | ICD-10-CM

## 2021-07-10 ENCOUNTER — Ambulatory Visit
Admission: RE | Admit: 2021-07-10 | Discharge: 2021-07-10 | Disposition: A | Discharge status: Home / Self Care | Payer: BC Managed Care – PPO | Source: Ambulatory Visit | Attending: Obstetrics & Gynecology | Admitting: Obstetrics & Gynecology

## 2021-07-10 ENCOUNTER — Other Ambulatory Visit: Payer: Self-pay | Admitting: Obstetrics & Gynecology

## 2021-07-10 DIAGNOSIS — N6320 Unspecified lump in the left breast, unspecified quadrant: Secondary | ICD-10-CM

## 2021-09-20 ENCOUNTER — Encounter (HOSPITAL_BASED_OUTPATIENT_CLINIC_OR_DEPARTMENT_OTHER): Payer: Self-pay

## 2021-09-20 ENCOUNTER — Emergency Department (HOSPITAL_BASED_OUTPATIENT_CLINIC_OR_DEPARTMENT_OTHER)
Admission: EM | Admit: 2021-09-20 | Discharge: 2021-09-21 | Disposition: A | Discharge status: Home / Self Care | Payer: BC Managed Care – PPO | Attending: Emergency Medicine | Admitting: Emergency Medicine

## 2021-09-20 ENCOUNTER — Other Ambulatory Visit: Payer: Self-pay

## 2021-09-20 DIAGNOSIS — K529 Noninfective gastroenteritis and colitis, unspecified: Secondary | ICD-10-CM | POA: Insufficient documentation

## 2021-09-20 DIAGNOSIS — R197 Diarrhea, unspecified: Secondary | ICD-10-CM | POA: Diagnosis present

## 2021-09-20 LAB — CBC
HCT: 37.7 % (ref 36.0–46.0)
Hemoglobin: 11.8 g/dL — ABNORMAL LOW (ref 12.0–15.0)
MCH: 23.9 pg — ABNORMAL LOW (ref 26.0–34.0)
MCHC: 31.3 g/dL (ref 30.0–36.0)
MCV: 76.5 fL — ABNORMAL LOW (ref 80.0–100.0)
Platelets: 359 10*3/uL (ref 150–400)
RBC: 4.93 MIL/uL (ref 3.87–5.11)
RDW: 15.5 % (ref 11.5–15.5)
WBC: 7.6 10*3/uL (ref 4.0–10.5)
nRBC: 0 % (ref 0.0–0.2)

## 2021-09-20 LAB — COMPREHENSIVE METABOLIC PANEL
ALT: 13 U/L (ref 0–44)
AST: 15 U/L (ref 15–41)
Albumin: 4.4 g/dL (ref 3.5–5.0)
Alkaline Phosphatase: 59 U/L (ref 38–126)
Anion gap: 9 (ref 5–15)
BUN: 10 mg/dL (ref 6–20)
CO2: 24 mmol/L (ref 22–32)
Calcium: 10.2 mg/dL (ref 8.9–10.3)
Chloride: 106 mmol/L (ref 98–111)
Creatinine, Ser: 0.93 mg/dL (ref 0.44–1.00)
GFR, Estimated: >60 mL/min (ref >60)
Glucose, Bld: 90 mg/dL (ref 70–99)
Potassium: 3.9 mmol/L (ref 3.5–5.1)
Sodium: 139 mmol/L (ref 135–145)
Total Bilirubin: 0.5 mg/dL (ref 0.3–1.2)
Total Protein: 7.3 g/dL (ref 6.5–8.1)

## 2021-09-20 LAB — URINALYSIS, ROUTINE W REFLEX MICROSCOPIC
Bilirubin Urine: NEGATIVE
Glucose, UA: NEGATIVE mg/dL
Hgb urine dipstick: NEGATIVE
Ketones, ur: NEGATIVE mg/dL
Leukocytes,Ua: NEGATIVE
Nitrite: NEGATIVE
Protein, ur: NEGATIVE mg/dL
Specific Gravity, Urine: <1.005 — ABNORMAL LOW (ref 1.005–1.030)
pH: 5.5 (ref 5.0–8.0)

## 2021-09-20 LAB — LIPASE, BLOOD: Lipase: 31 U/L (ref 11–51)

## 2021-09-20 LAB — HCG, SERUM, QUALITATIVE: Preg, Serum: NEGATIVE

## 2021-09-20 MED ORDER — LACTATED RINGERS IV BOLUS
1000.0000 mL | Freq: Once | INTRAVENOUS | Status: AC
  Administered 2021-09-20: 1000 mL via INTRAVENOUS

## 2021-09-20 MED ORDER — ONDANSETRON HCL 4 MG/2ML IJ SOLN
4.0000 mg | Freq: Once | INTRAMUSCULAR | Status: AC
  Administered 2021-09-20: 4 mg via INTRAVENOUS
  Filled 2021-09-20: qty 2

## 2021-09-20 NOTE — ED Provider Notes (Signed)
[facility] EMERGENCY DEPT  Provider Note  CSN: [id number] Arrival date & time: 09/20/21 2114  History Chief Complaint  Patient presents with   Abdominal Pain    Diane Patel is a 41 y.o. female with prior history of cholecystectomy and appendectomy reports about 4-5 days of persistent watery, nonbloody diarrhea, nausea without vomiting and migrating, intermittent abdominal pains. No fevers. She noted her BP was a little elevated at home earlier prompting her ED visit. She took some TUMS with minimal relief.    Home Medications Prior to Admission medications   Medication Sig Start Date End Date Taking? Authorizing Provider  dicyclomine (BENTYL) 20 MG tablet Take 1 tablet (20 mg total) by mouth 2 (two) times daily. 09/21/21  Yes Truddie Hidden, MD  loperamide (IMODIUM) 2 MG capsule Take 1 capsule (2 mg total) by mouth 4 (four) times daily as needed for diarrhea or loose stools. 09/21/21  Yes Truddie Hidden, MD  ondansetron (ZOFRAN-ODT) 4 MG disintegrating tablet Take 1 tablet (4 mg total) by mouth every 8 (eight) hours as needed for nausea or vomiting. 09/21/21  Yes Truddie Hidden, MD  acetaminophen (TYLENOL) 500 MG tablet Take 1 tablet (500 mg total) by mouth every 6 (six) hours as needed. Patient taking differently: Take 500-1,000 mg by mouth every 6 (six) hours as needed for mild pain or headache. 04/13/19   Rodell Perna A, PA-C  albuterol (PROVENTIL HFA;VENTOLIN HFA) 108 (90 Base) MCG/ACT inhaler Inhale 1-2 puffs into the lungs every 6 (six) hours as needed for wheezing or shortness of breath. 04/18/17   Khatri, Hina, PA-C  diclofenac Sodium (VOLTAREN) 1 % GEL Apply 4 g topically 4 (four) times daily as needed. 08/11/20   Hilts, Michael, MD  ferrous sulfate 324 MG TBEC Take 1 tablet by mouth daily. 10/05/20   [provider]  furosemide (LASIX) 20 MG tablet TAKE 1 TABLET (20 MG TOTAL) BY MOUTH DAILY AS NEEDED. FOR MORE THAN USUAL LEG/HAND SWELLING 07/03/21  10/01/21  Patwardhan, Reynold Bowen, MD  labetalol (NORMODYNE) 200 MG tablet Take 1 tablet (200 mg total) by mouth 2 (two) times daily. 10/30/20   Patwardhan, Reynold Bowen, MD  metFORMIN (GLUCOPHAGE) 500 MG tablet Take 500 mg by mouth daily. 08/01/20   [provider]  ondansetron (ZOFRAN) 4 MG tablet Take 1 tablet (4 mg total) by mouth every 6 (six) hours. 01/06/20   Tegeler, Gwenyth Allegra, MD  pantoprazole (PROTONIX) 40 MG tablet Take 40 mg by mouth daily.    [provider]  vitamin E 1000 UNIT capsule Take 1,000 Units by mouth daily.    [provider]     Allergies    Ace inhibitors, Pineapple, Flexeril [cyclobenzaprine], Lactose intolerance (gi), Other, Chocolate, Cocoa, and Naproxen   Review of Systems   Review of Systems Please see HPI for pertinent positives and negatives  Physical Exam BP (!) 145/74   Pulse 68   Temp 99.1 F (37.3 C)   Resp 19   Ht '5\' 2"'$  (1.575 m)   Wt 132.5 kg   SpO2 100%   BMI 53.43 kg/m   Physical Exam Vitals and nursing note reviewed.  Constitutional:      Appearance: Normal appearance.  HENT:     Head: Normocephalic and atraumatic.     Nose: Nose normal.     Mouth/Throat:     Mouth: Mucous membranes are moist.  Eyes:     Extraocular Movements: Extraocular movements intact.     Conjunctiva/sclera: Conjunctivae  normal.  Cardiovascular:     Rate and Rhythm: Normal rate.  Pulmonary:     Effort: Pulmonary effort is normal.     Breath sounds: Normal breath sounds.  Abdominal:     General: Abdomen is flat.     Palpations: Abdomen is soft.     Tenderness: There is no abdominal tenderness. There is no guarding or rebound.  Musculoskeletal:        General: No swelling. Normal range of motion.     Cervical back: Neck supple.  Skin:    General: Skin is warm and dry.  Neurological:     General: No focal deficit present.     Mental Status: She is alert.  Psychiatric:        Mood and Affect: Mood normal.     ED Results /  Procedures / Treatments   EKG EKG Interpretation  Date/Time:  Thursday September 20 2021 21:38:10 EDT Ventricular Rate:  96 PR Interval:  146 QRS Duration: 76 QT Interval:  354 QTC Calculation: 447 R Axis:   49 Text Interpretation: Normal sinus rhythm Normal ECG When compared with ECG of 01-Oct-2020 09:21, No significant change since last tracing Confirmed by Aletta Edouard [phone]) on 09/20/2021 9:41:47 PM  Procedures Procedures  Medications Ordered in the ED Medications  dicyclomine (BENTYL) capsule 20 mg (has no administration in time range)  lactated ringers bolus 1,000 mL (1,000 mLs Intravenous New Bag/Given 09/20/21 2331)  ondansetron (ZOFRAN) injection 4 mg (4 mg Intravenous Given 09/20/21 2333)    Initial Impression and Plan  Patient here with GI symptoms, most concerning is diarrhea. Her abdominal exam is benign. Labs done in triage show CBC with mild anemia, CMP, lipase and UA are normal. Given her benign exam, normal labs and prior surgeries, no indication for imaging. Will give some IVF and zofran for comfort and reassess for disposition.   ED Course   Clinical Course as of 09/21/21 0036  Fri Sep 21, 2021  8841 Patient feeling better after fluids. Plan Rx for zofran and bentyl as well as OTC imodium for symptom control. PCP follow up. RTED for any worsening or other concerns.  [CS]    Clinical Course User Index [CS] Truddie Hidden, MD     MDM Rules/Calculators/A&P Medical Decision Making Problems Addressed: Gastroenteritis: acute illness or injury  Amount and/or Complexity of Data Reviewed Labs: ordered. Decision-making details documented in ED Course.  Risk Prescription drug management.    Final Clinical Impression(s) / ED Diagnoses Final diagnoses:  Gastroenteritis    Rx / DC Orders ED Discharge Orders          Ordered    ondansetron (ZOFRAN-ODT) 4 MG disintegrating tablet  Every 8 hours PRN        09/21/21 0035    dicyclomine (BENTYL) 20 MG  tablet  2 times daily        09/21/21 0035    loperamide (IMODIUM) 2 MG capsule  4 times daily PRN        09/21/21 0035             Truddie Hidden, MD 09/21/21 [phone]

## 2021-09-20 NOTE — ED Triage Notes (Signed)
Patient here POV from Home.  Endorses Nausea and Diarrhea associated with ABD Pain that began approximately 5 Days ago. Worsened since.   No Emesis. No Known Fevers at Home. Pain is RUQ and LUQ.   NAD Noted during Triage. A&Ox4. GCS 15. Ambulatory.

## 2021-09-21 MED ORDER — ONDANSETRON 4 MG PO TBDP
4.0000 mg | ORAL_TABLET | Freq: Three times a day (TID) | ORAL | 0 refills | Status: DC | PRN
Start: 2021-09-21 — End: 2021-12-18

## 2021-09-21 MED ORDER — LOPERAMIDE HCL 2 MG PO CAPS: 2.0000 mg | ORAL_CAPSULE | Freq: Four times a day (QID) | ORAL | 0 refills | Status: AC | PRN

## 2021-09-21 MED ORDER — DICYCLOMINE HCL 10 MG PO CAPS
20.0000 mg | ORAL_CAPSULE | Freq: Once | ORAL | Status: AC
  Administered 2021-09-21: 20 mg via ORAL
  Filled 2021-09-21: qty 2

## 2021-09-21 MED ORDER — DICYCLOMINE HCL 20 MG PO TABS
20.0000 mg | ORAL_TABLET | Freq: Two times a day (BID) | ORAL | 0 refills | Status: AC
Start: 2021-09-21 — End: ?

## 2021-09-21 NOTE — ED Notes (Signed)
Reviewed AVS/discharge instruction with patient. Time allotted for and all questions answered. Patient is agreeable for d/c and escorted to ed exit by staff.  

## 2021-12-17 ENCOUNTER — Other Ambulatory Visit: Payer: Self-pay | Admitting: Cardiology

## 2021-12-17 DIAGNOSIS — I10 Essential (primary) hypertension: Secondary | ICD-10-CM

## 2021-12-18 ENCOUNTER — Emergency Department (HOSPITAL_BASED_OUTPATIENT_CLINIC_OR_DEPARTMENT_OTHER)
Admission: EM | Admit: 2021-12-18 | Discharge: 2021-12-18 | Disposition: A | Discharge status: Home / Self Care | Payer: BC Managed Care – PPO | Attending: Emergency Medicine | Admitting: Emergency Medicine

## 2021-12-18 ENCOUNTER — Encounter (HOSPITAL_BASED_OUTPATIENT_CLINIC_OR_DEPARTMENT_OTHER): Payer: Self-pay

## 2021-12-18 ENCOUNTER — Other Ambulatory Visit: Payer: Self-pay

## 2021-12-18 DIAGNOSIS — R197 Diarrhea, unspecified: Secondary | ICD-10-CM | POA: Insufficient documentation

## 2021-12-18 DIAGNOSIS — Z7984 Long term (current) use of oral hypoglycemic drugs: Secondary | ICD-10-CM | POA: Diagnosis not present

## 2021-12-18 DIAGNOSIS — D72829 Elevated white blood cell count, unspecified: Secondary | ICD-10-CM | POA: Diagnosis not present

## 2021-12-18 DIAGNOSIS — R1011 Right upper quadrant pain: Secondary | ICD-10-CM | POA: Diagnosis not present

## 2021-12-18 DIAGNOSIS — R1013 Epigastric pain: Secondary | ICD-10-CM | POA: Diagnosis not present

## 2021-12-18 DIAGNOSIS — R1012 Left upper quadrant pain: Secondary | ICD-10-CM | POA: Insufficient documentation

## 2021-12-18 DIAGNOSIS — R1110 Vomiting, unspecified: Secondary | ICD-10-CM

## 2021-12-18 DIAGNOSIS — R112 Nausea with vomiting, unspecified: Secondary | ICD-10-CM | POA: Insufficient documentation

## 2021-12-18 DIAGNOSIS — Z79899 Other long term (current) drug therapy: Secondary | ICD-10-CM | POA: Insufficient documentation

## 2021-12-18 DIAGNOSIS — Z1152 Encounter for screening for COVID-19: Secondary | ICD-10-CM | POA: Insufficient documentation

## 2021-12-18 DIAGNOSIS — I10 Essential (primary) hypertension: Secondary | ICD-10-CM | POA: Insufficient documentation

## 2021-12-18 LAB — COMPREHENSIVE METABOLIC PANEL
ALT: 13 U/L (ref 0–44)
AST: 17 U/L (ref 15–41)
Albumin: 4.6 g/dL (ref 3.5–5.0)
Alkaline Phosphatase: 61 U/L (ref 38–126)
Anion gap: 12 (ref 5–15)
BUN: 14 mg/dL (ref 6–20)
CO2: 21 mmol/L — ABNORMAL LOW (ref 22–32)
Calcium: 9.6 mg/dL (ref 8.9–10.3)
Chloride: 105 mmol/L (ref 98–111)
Creatinine, Ser: 0.75 mg/dL (ref 0.44–1.00)
GFR, Estimated: >60 mL/min (ref >60)
Glucose, Bld: 98 mg/dL (ref 70–99)
Potassium: 4.1 mmol/L (ref 3.5–5.1)
Sodium: 138 mmol/L (ref 135–145)
Total Bilirubin: 0.9 mg/dL (ref 0.3–1.2)
Total Protein: 7.5 g/dL (ref 6.5–8.1)

## 2021-12-18 LAB — CBC
HCT: 40.9 % (ref 36.0–46.0)
Hemoglobin: 12.8 g/dL (ref 12.0–15.0)
MCH: 24.9 pg — ABNORMAL LOW (ref 26.0–34.0)
MCHC: 31.3 g/dL (ref 30.0–36.0)
MCV: 79.6 fL — ABNORMAL LOW (ref 80.0–100.0)
Platelets: 305 10*3/uL (ref 150–400)
RBC: 5.14 MIL/uL — ABNORMAL HIGH (ref 3.87–5.11)
RDW: 14.9 % (ref 11.5–15.5)
WBC: 10.9 10*3/uL — ABNORMAL HIGH (ref 4.0–10.5)
nRBC: 0 % (ref 0.0–0.2)

## 2021-12-18 LAB — LIPASE, BLOOD: Lipase: 18 U/L (ref 11–51)

## 2021-12-18 LAB — RESP PANEL BY RT-PCR (FLU A&B, COVID) ARPGX2
Influenza A by PCR: NEGATIVE
Influenza B by PCR: NEGATIVE
SARS Coronavirus 2 by RT PCR: NEGATIVE

## 2021-12-18 MED ORDER — ONDANSETRON 8 MG PO TBDP
8.0000 mg | ORAL_TABLET | Freq: Three times a day (TID) | ORAL | 0 refills | Status: AC | PRN
Start: 2021-12-18 — End: ?

## 2021-12-18 MED ORDER — SODIUM CHLORIDE 0.9 % IV BOLUS
1000.0000 mL | Freq: Once | INTRAVENOUS | Status: AC
  Administered 2021-12-18: 1000 mL via INTRAVENOUS

## 2021-12-18 MED ORDER — ONDANSETRON HCL 4 MG/2ML IJ SOLN
4.0000 mg | Freq: Once | INTRAMUSCULAR | Status: AC
  Administered 2021-12-18: 4 mg via INTRAVENOUS
  Filled 2021-12-18: qty 2

## 2021-12-18 NOTE — ED Provider Notes (Signed)
[place] [facility] EMERGENCY DEPT Provider Note   CSN: [id number] Arrival date & time: 12/18/21  0827     History  Chief Complaint  Patient presents with   Abdominal Pain   Emesis    Diane Patel is a 41 y.o. female.   Abdominal Pain Associated symptoms: vomiting   Emesis Associated symptoms: abdominal pain      Patient with medical history of GERD, hypertension, PCOS, status post cholecystectomy and appendectomy presents today due to nausea, vomiting, diarrhea and diffuse abdominal pain x1 day.  Patient states she was at a community event yesterday with a pot luck, she was having diarrhea throughout the day greater than 5 episodes without blood or mucus.  When she got home from the event she started having nausea and multiple episodes of emesis with some streaks of hematemesis this morning.  Denies any frank blood or coffee-ground emesis.  The abdominal pain is upper abdominal in nature, started after the vomiting.  Denies any dysuria, hematuria, chest pain, shortness of breath, cough.  She does have some nasal congestion with started yesterday as well.  Her husband is sick with similar symptoms.  Home Medications Prior to Admission medications   Medication Sig Start Date End Date Taking? Authorizing Provider  ondansetron (ZOFRAN-ODT) 8 MG disintegrating tablet Take 1 tablet (8 mg total) by mouth every 8 (eight) hours as needed for nausea or vomiting. 12/18/21  Yes Sherrill Raring, PA-C  acetaminophen (TYLENOL) 500 MG tablet Take 1 tablet (500 mg total) by mouth every 6 (six) hours as needed. Patient taking differently: Take 500-1,000 mg by mouth every 6 (six) hours as needed for mild pain or headache. 04/13/19   Rodell Perna A, PA-C  albuterol (PROVENTIL HFA;VENTOLIN HFA) 108 (90 Base) MCG/ACT inhaler Inhale 1-2 puffs into the lungs every 6 (six) hours as needed for wheezing or shortness of breath. 04/18/17   Khatri, Hina, PA-C  diclofenac Sodium (VOLTAREN) 1 % GEL Apply 4 g  topically 4 (four) times daily as needed. 08/11/20   Hilts, Legrand Como, MD  dicyclomine (BENTYL) 20 MG tablet Take 1 tablet (20 mg total) by mouth 2 (two) times daily. 09/21/21   Truddie Hidden, MD  ferrous sulfate 324 MG TBEC Take 1 tablet by mouth daily. 10/05/20   [provider]  furosemide (LASIX) 20 MG tablet TAKE 1 TABLET (20 MG TOTAL) BY MOUTH DAILY AS NEEDED. FOR MORE THAN USUAL LEG/HAND SWELLING 07/03/21 10/01/21  Patwardhan, Reynold Bowen, MD  labetalol (NORMODYNE) 200 MG tablet Take 1 tablet (200 mg total) by mouth 2 (two) times daily. 10/30/20   Patwardhan, Reynold Bowen, MD  loperamide (IMODIUM) 2 MG capsule Take 1 capsule (2 mg total) by mouth 4 (four) times daily as needed for diarrhea or loose stools. 09/21/21   Truddie Hidden, MD  metFORMIN (GLUCOPHAGE) 500 MG tablet Take 500 mg by mouth daily. 08/01/20   [provider]  ondansetron (ZOFRAN) 4 MG tablet Take 1 tablet (4 mg total) by mouth every 6 (six) hours. 01/06/20   Tegeler, Gwenyth Allegra, MD  pantoprazole (PROTONIX) 40 MG tablet Take 40 mg by mouth daily.    [provider]  vitamin E 1000 UNIT capsule Take 1,000 Units by mouth daily.    [provider]      Allergies    Ace inhibitors, Pineapple, Flexeril [cyclobenzaprine], Lactose intolerance (gi), Other, Chocolate, Cocoa, and Naproxen    Review of Systems   Review of Systems  Gastrointestinal:  Positive for abdominal pain and vomiting.  Physical Exam Updated Vital Signs BP (!) 127/92 (BP Location: Right Arm)   Pulse 86   Temp 98.9 F (37.2 C) (Oral)   Resp 16   SpO2 100%  Physical Exam Vitals and nursing note reviewed. Exam conducted with a chaperone present.  Constitutional:      Appearance: Normal appearance.  HENT:     Head: Normocephalic and atraumatic.  Eyes:     General: No scleral icterus.       Right eye: No discharge.        Left eye: No discharge.     Extraocular Movements: Extraocular movements intact.     Pupils:  Pupils are equal, round, and reactive to light.  Cardiovascular:     Rate and Rhythm: Normal rate and regular rhythm.     Pulses: Normal pulses.     Heart sounds: Normal heart sounds. No murmur heard.    No friction rub. No gallop.  Pulmonary:     Effort: Pulmonary effort is normal. No respiratory distress.     Breath sounds: Normal breath sounds.  Abdominal:     General: Abdomen is flat. Bowel sounds are normal. There is no distension.     Palpations: Abdomen is soft.     Tenderness: There is abdominal tenderness in the right upper quadrant, epigastric area and left upper quadrant. There is no guarding.  Skin:    General: Skin is warm and dry.     Coloration: Skin is not jaundiced.  Neurological:     Mental Status: She is alert. Mental status is at baseline.     Coordination: Coordination normal.     ED Results / Procedures / Treatments   Labs (all labs ordered are listed, but only abnormal results are displayed) Labs Reviewed  COMPREHENSIVE METABOLIC PANEL - Abnormal; Notable for the following components:      Result Value   CO2 21 (*)    All other components within normal limits  CBC - Abnormal; Notable for the following components:   WBC 10.9 (*)    RBC 5.14 (*)    MCV 79.6 (*)    MCH 24.9 (*)    All other components within normal limits  RESP PANEL BY RT-PCR (FLU A&B, COVID) ARPGX2  LIPASE, BLOOD    EKG None  Radiology No results found.  Procedures Procedures    Medications Ordered in ED Medications  sodium chloride 0.9 % bolus 1,000 mL (1,000 mLs Intravenous New Bag/Given 12/18/21 0946)  ondansetron (ZOFRAN) injection 4 mg (4 mg Intravenous Given 12/18/21 0948)    ED Course/ Medical Decision Making/ A&P Clinical Course as of 12/18/21 1124  Tue Dec 18, 2021  1016 CBC(!) No anemia.  Very mild leukocytosis with a white cell count of 10.9. [HS]  6384 Resp Panel by RT-PCR (Flu A&B, Covid) Anterior Nasal Swab COVID and flu were negative [HS]  1039  Comprehensive metabolic panel(!) No gross electrolyte derangement or AKI [HS]  1043 I reassessed the patient after fluids states she is feeling improved.  Nausea has fully resolved.  No episodes of diarrhea while in the emergency department or episodes of emesis. [HS]    Clinical Course User Index [HS] Sherrill Raring, PA-C                           Medical Decision Making Amount and/or Complexity of Data Reviewed Labs: ordered. Decision-making details documented in ED Course.  Risk Prescription drug management.   This patient  presents to the ED for concern of nausea, vomiting, diarrhea., this involves an extensive number of treatment options, and is a complaint that carries with it a high risk of complications and morbidity.  The differential diagnosis includes viral process, gastroenteritis, dehydration, AKI, metabolic derangement, electrolyte abnormality, COVID  Patient's presentation is complicated by their history of hypertension, prediabetes, GERD, hyperlipidemia, PCOS.   Additional history obtained:   Independent historian: husband  I reviewed external medical records, medicatio nlist, and prior surgeries.    Lab Tests:  I ordered, viewed, and personally interpreted labs.  The pertinent results include:  see ED course    Imaging Studies ordered:  Considered imaging but given patient is having generalized abdominal tenderness without any focality in setting of vomiting and diarrhea x1 day I do not think any particularly beneficial.    ECG/Cardiac monitoring:    The patient was maintained on a cardiac monitor.  Visualized monitor strip which showed NSR per my interpretation.    Medicines ordered and prescription drug management:  I ordered medication including: Zofran, liter fluid bolus  I have reviewed the patients home medicines and have made adjustments as needed   Problems addressed / ED Course: Patient presents due to nausea and vomiting.  Suspect possible  viral process, reassuring laboratory work-up without AKI, metabolic abnormality and there is no focality and abdominal pain.  Patient improved after fluids and nausea medicine, reviewed abdominal exam is benign. Tolerating PO.     Social Determinants of Health: Has PCP outpatient   Disposition:   After consideration of the diagnostic results and the patients response to treatment, I feel that the patent would benefit from outpatient follow-up with primary care doctor strict return precautions.            Final Clinical Impression(s) / ED Diagnoses Final diagnoses:  Vomiting and diarrhea    Rx / DC Orders ED Discharge Orders          Ordered    ondansetron (ZOFRAN-ODT) 8 MG disintegrating tablet  Every 8 hours PRN        12/18/21 1058              Sherrill Raring, PA-C 12/18/21 [place], DO 12/18/21 1133

## 2021-12-18 NOTE — Discharge Instructions (Addendum)
You are seen today in the emergency department for nausea and vomiting and diarrhea.  Your work-up today was very reassuring.  Follow-up with your primary care resolving symptoms in the next few days.  You can return to work when you are vomiting and diarrhea free for greater than 24 hours.  Take Zofran as needed every 8 hours for nausea.  Stick with bland foods, slowly introduce foods as you are able to tolerate.

## 2021-12-18 NOTE — ED Triage Notes (Signed)
Pt presents POV from home  Reports waking up at 0300 this am with diarrhea and vomiting. Pt reports "liquid stool" associated with feeling hot and dry mouth

## 2021-12-18 NOTE — ED Notes (Signed)
Patient verbalized understanding of discharge instructions and reasons to return to the ED 

## 2022-01-10 ENCOUNTER — Ambulatory Visit
Admission: RE | Admit: 2022-01-10 | Discharge: 2022-01-10 | Disposition: A | Discharge status: Home / Self Care | Payer: BC Managed Care – PPO | Source: Ambulatory Visit | Attending: Obstetrics & Gynecology | Admitting: Obstetrics & Gynecology

## 2022-01-10 DIAGNOSIS — N6320 Unspecified lump in the left breast, unspecified quadrant: Secondary | ICD-10-CM

## 2022-03-13 ENCOUNTER — Other Ambulatory Visit: Payer: Self-pay | Admitting: Cardiology

## 2022-03-13 DIAGNOSIS — I10 Essential (primary) hypertension: Secondary | ICD-10-CM

## 2022-08-13 ENCOUNTER — Encounter (HOSPITAL_BASED_OUTPATIENT_CLINIC_OR_DEPARTMENT_OTHER): Payer: Self-pay | Admitting: Emergency Medicine

## 2022-08-13 ENCOUNTER — Emergency Department (HOSPITAL_BASED_OUTPATIENT_CLINIC_OR_DEPARTMENT_OTHER)
Admission: EM | Admit: 2022-08-13 | Discharge: 2022-08-13 | Disposition: A | Discharge status: Home / Self Care | Payer: BC Managed Care – PPO | Attending: Emergency Medicine | Admitting: Emergency Medicine

## 2022-08-13 ENCOUNTER — Other Ambulatory Visit: Payer: Self-pay

## 2022-08-13 DIAGNOSIS — Z1152 Encounter for screening for COVID-19: Secondary | ICD-10-CM | POA: Diagnosis not present

## 2022-08-13 DIAGNOSIS — E119 Type 2 diabetes mellitus without complications: Secondary | ICD-10-CM | POA: Insufficient documentation

## 2022-08-13 DIAGNOSIS — Z79899 Other long term (current) drug therapy: Secondary | ICD-10-CM | POA: Diagnosis not present

## 2022-08-13 DIAGNOSIS — R5383 Other fatigue: Secondary | ICD-10-CM | POA: Insufficient documentation

## 2022-08-13 DIAGNOSIS — J209 Acute bronchitis, unspecified: Secondary | ICD-10-CM | POA: Insufficient documentation

## 2022-08-13 DIAGNOSIS — J45909 Unspecified asthma, uncomplicated: Secondary | ICD-10-CM | POA: Insufficient documentation

## 2022-08-13 DIAGNOSIS — Z7984 Long term (current) use of oral hypoglycemic drugs: Secondary | ICD-10-CM | POA: Insufficient documentation

## 2022-08-13 DIAGNOSIS — R0602 Shortness of breath: Secondary | ICD-10-CM | POA: Diagnosis present

## 2022-08-13 DIAGNOSIS — I10 Essential (primary) hypertension: Secondary | ICD-10-CM | POA: Diagnosis not present

## 2022-08-13 LAB — GROUP A STREP BY PCR: Group A Strep by PCR: NOT DETECTED

## 2022-08-13 LAB — SARS CORONAVIRUS 2 BY RT PCR: SARS Coronavirus 2 by RT PCR: NEGATIVE

## 2022-08-13 MED ORDER — PREDNISONE 20 MG PO TABS
40.0000 mg | ORAL_TABLET | Freq: Once | ORAL | Status: AC
Start: 2022-08-13 — End: 2022-08-13
  Administered 2022-08-13: 40 mg via ORAL
  Filled 2022-08-13: qty 2

## 2022-08-13 MED ORDER — ALBUTEROL SULFATE HFA 108 (90 BASE) MCG/ACT IN AERS
2.0000 | INHALATION_SPRAY | Freq: Once | RESPIRATORY_TRACT | Status: AC
  Administered 2022-08-13: 2 via RESPIRATORY_TRACT
  Filled 2022-08-13: qty 6.7

## 2022-08-13 MED ORDER — PREDNISONE 10 MG PO TABS: 20.0000 mg | ORAL_TABLET | Freq: Two times a day (BID) | ORAL | 0 refills | Status: AC

## 2022-08-13 MED ORDER — AZITHROMYCIN 250 MG PO TABS
500.0000 mg | ORAL_TABLET | Freq: Once | ORAL | Status: AC
  Administered 2022-08-13: 500 mg via ORAL
  Filled 2022-08-13: qty 2

## 2022-08-13 MED ORDER — AZITHROMYCIN 250 MG PO TABS: 250.0000 mg | ORAL_TABLET | Freq: Every day | ORAL | 0 refills | Status: AC

## 2022-08-13 NOTE — ED Provider Notes (Signed)
[place] EMERGENCY DEPARTMENT AT [facility] Provider Note   CSN: [id number] Arrival date & time: 08/13/22  0137     History  Chief Complaint  Patient presents with   Shortness of Breath    Diane Patel is a 42 y.o. female.  Patient is a 42 year old female with past medical history of asthma, hypertension, type 2 diabetes, obesity.  Patient presenting today with complaints of cough, congestion, feeling short of breath, ear pressure, and fatigue for the past 1 to 2 weeks.  Symptoms not improving with home medications.  She denies any fevers or chills.  No ill contacts.  The history is provided by the patient.       Home Medications Prior to Admission medications   Medication Sig Start Date End Date Taking? Authorizing Provider  acetaminophen (TYLENOL) 500 MG tablet Take 1 tablet (500 mg total) by mouth every 6 (six) hours as needed. Patient taking differently: Take 500-1,000 mg by mouth every 6 (six) hours as needed for mild pain or headache. 04/13/19   Michela Pitcher A, PA-C  albuterol (PROVENTIL HFA;VENTOLIN HFA) 108 (90 Base) MCG/ACT inhaler Inhale 1-2 puffs into the lungs every 6 (six) hours as needed for wheezing or shortness of breath. 04/18/17   Khatri, Hina, PA-C  diclofenac Sodium (VOLTAREN) 1 % GEL Apply 4 g topically 4 (four) times daily as needed. 08/11/20   Hilts, Casimiro Needle, MD  dicyclomine (BENTYL) 20 MG tablet Take 1 tablet (20 mg total) by mouth 2 (two) times daily. 09/21/21   Pollyann Savoy, MD  ferrous sulfate 324 MG TBEC Take 1 tablet by mouth daily. 10/05/20   [provider]  furosemide (LASIX) 20 MG tablet TAKE 1 TABLET (20 MG TOTAL) BY MOUTH DAILY AS NEEDED. FOR MORE THAN USUAL LEG/HAND SWELLING 07/03/21 10/01/21  Patwardhan, Anabel Bene, MD  labetalol (NORMODYNE) 200 MG tablet Take 1 tablet (200 mg total) by mouth 2 (two) times daily. 10/30/20   Patwardhan, Anabel Bene, MD  loperamide (IMODIUM) 2 MG capsule Take 1 capsule (2 mg total) by mouth 4 (four)  times daily as needed for diarrhea or loose stools. 09/21/21   Pollyann Savoy, MD  metFORMIN (GLUCOPHAGE) 500 MG tablet Take 500 mg by mouth daily. 08/01/20   [provider]  ondansetron (ZOFRAN) 4 MG tablet Take 1 tablet (4 mg total) by mouth every 6 (six) hours. 01/06/20   Tegeler, Canary Brim, MD  ondansetron (ZOFRAN-ODT) 8 MG disintegrating tablet Take 1 tablet (8 mg total) by mouth every 8 (eight) hours as needed for nausea or vomiting. 12/18/21   Theron Arista, PA-C  pantoprazole (PROTONIX) 40 MG tablet Take 40 mg by mouth daily.    [provider]  vitamin E 1000 UNIT capsule Take 1,000 Units by mouth daily.    [provider]      Allergies    Ace inhibitors, Pineapple, Flexeril [cyclobenzaprine], Lactose intolerance (gi), Other, Chocolate, Cocoa, and Naproxen    Review of Systems   Review of Systems  All other systems reviewed and are negative.   Physical Exam Updated Vital Signs BP (!) 143/98 (BP Location: Left Arm)   Pulse 98   Temp 98 F (36.7 C) (Oral)   Resp 19   Ht 5\' 2"  (1.575 m)   Wt 131.5 kg   LMP 08/12/2022 (Exact Date)   SpO2 99%   BMI 53.04 kg/m  Physical Exam Vitals and nursing note reviewed.  Constitutional:      General: She is not in acute  distress.    Appearance: She is well-developed. She is not diaphoretic.  HENT:     Head: Normocephalic and atraumatic.  Cardiovascular:     Rate and Rhythm: Normal rate and regular rhythm.     Heart sounds: No murmur heard.    No friction rub. No gallop.  Pulmonary:     Effort: Pulmonary effort is normal. No respiratory distress.     Breath sounds: Normal breath sounds. No wheezing.  Abdominal:     General: Bowel sounds are normal. There is no distension.     Palpations: Abdomen is soft.     Tenderness: There is no abdominal tenderness.  Musculoskeletal:        General: Normal range of motion.     Cervical back: Normal range of motion and neck supple.  Skin:    General: Skin is  warm and dry.  Neurological:     General: No focal deficit present.     Mental Status: She is alert and oriented to person, place, and time.     ED Results / Procedures / Treatments   Labs (all labs ordered are listed, but only abnormal results are displayed) Labs Reviewed  SARS CORONAVIRUS 2 BY RT PCR  GROUP A STREP BY PCR    EKG None  Radiology No results found.  Procedures Procedures    Medications Ordered in ED Medications  predniSONE (DELTASONE) tablet 40 mg (has no administration in time range)  azithromycin (ZITHROMAX) tablet 500 mg (has no administration in time range)  albuterol (VENTOLIN HFA) 108 (90 Base) MCG/ACT inhaler 2 puff (has no administration in time range)    ED Course/ Medical Decision Making/ A&P  Patient presenting with persistent URI symptoms for nearly the past 2 weeks.  Patient has negative COVID and strep test.  She will be treated with Zithromax, prednisone, and her albuterol inhaler will be renewed.  Symptoms most consistent with an acute bronchitis.  Final Clinical Impression(s) / ED Diagnoses Final diagnoses:  None    Rx / DC Orders ED Discharge Orders     None         Geoffery Lyons, MD 08/13/22 [phone]

## 2022-08-13 NOTE — ED Notes (Signed)
Patient provided with inhaler and spacer. Education provided for use of both. Patient able to perform without difficulty.

## 2022-08-13 NOTE — Discharge Instructions (Signed)
Begin taking Zithromax and prednisone as prescribed.  Use the albuterol inhaler, 2 puffs every 4 hours as needed for wheezing/difficulty breathing.  Continue over-the-counter medications as previously prescribed.  Drink plenty of fluids and get plenty of rest.  Follow-up with primary doctor if not improving in the next few days.

## 2022-08-13 NOTE — ED Triage Notes (Addendum)
Pt reports SOB, cough, congestion, bilateral ear pain x several days, pt reports hx of asthma and bronchitis

## 2023-03-06 IMAGING — DX DG ANKLE COMPLETE 3+V*R*
3 series · 3 of 3 positions shown · non-contrast
Comparison: None.

CLINICAL DATA: Right ankle pain and swelling after fall.

EXAM:
RIGHT ANKLE - COMPLETE 3+ VIEW

[ankle ap]
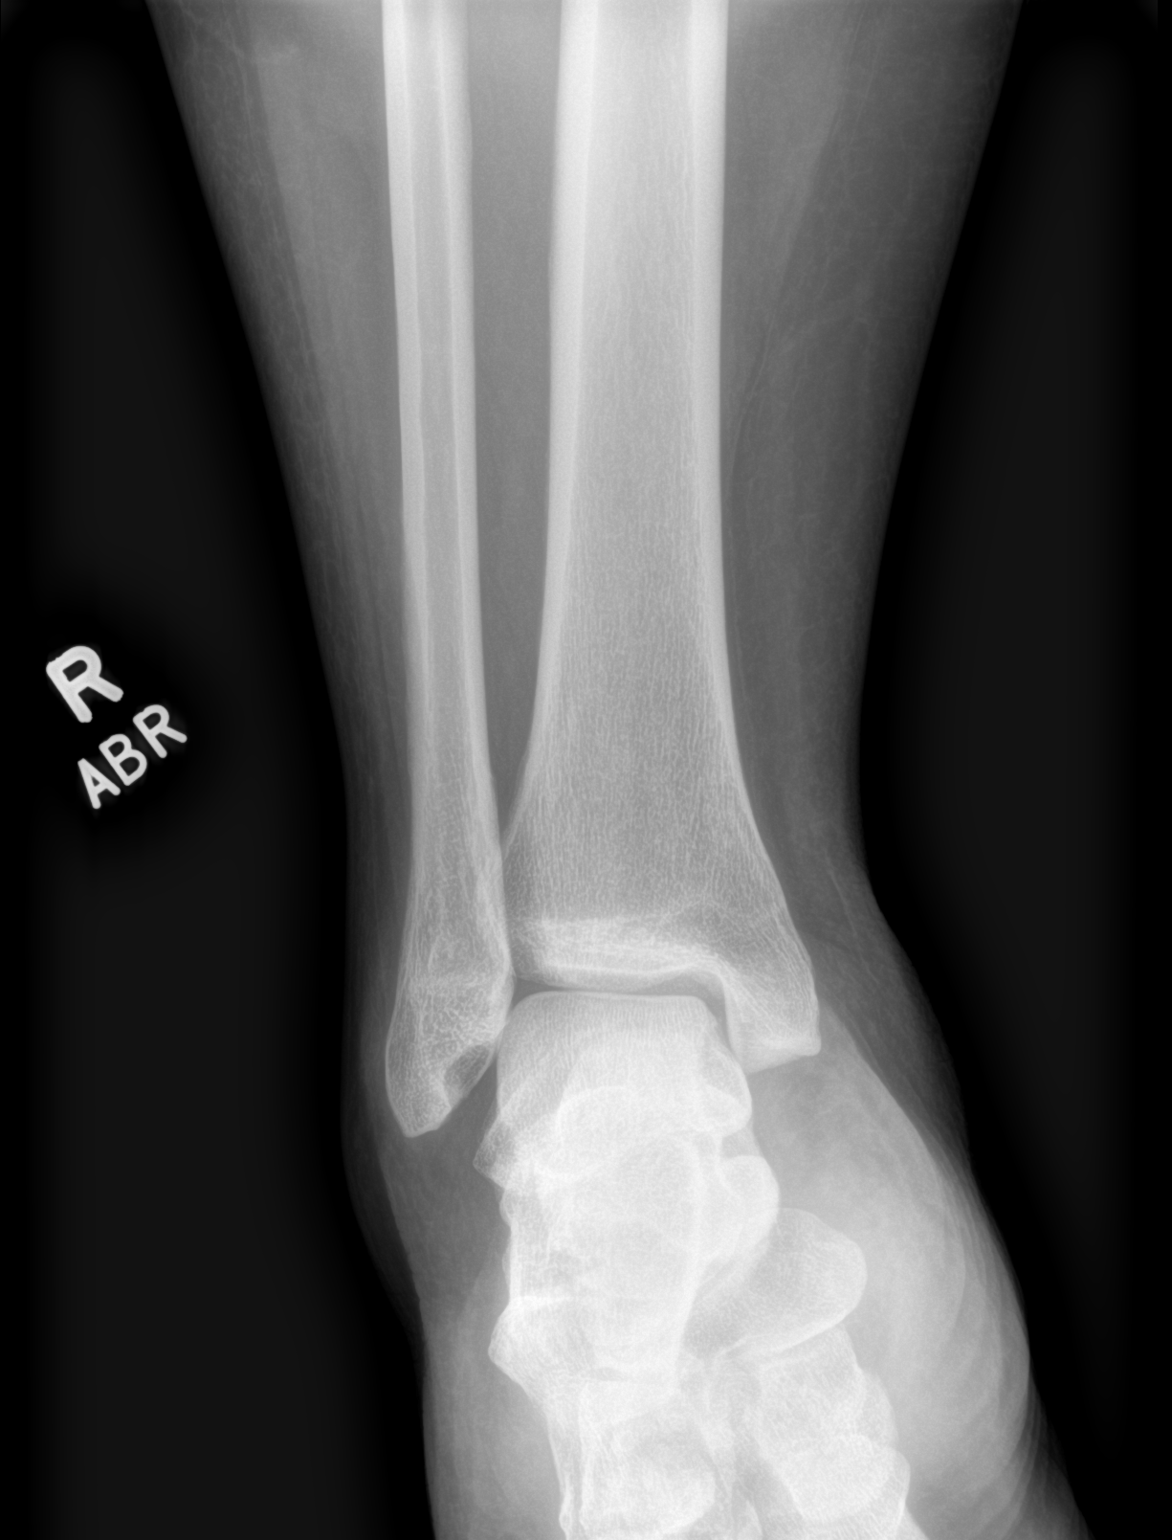

[ankle obl]
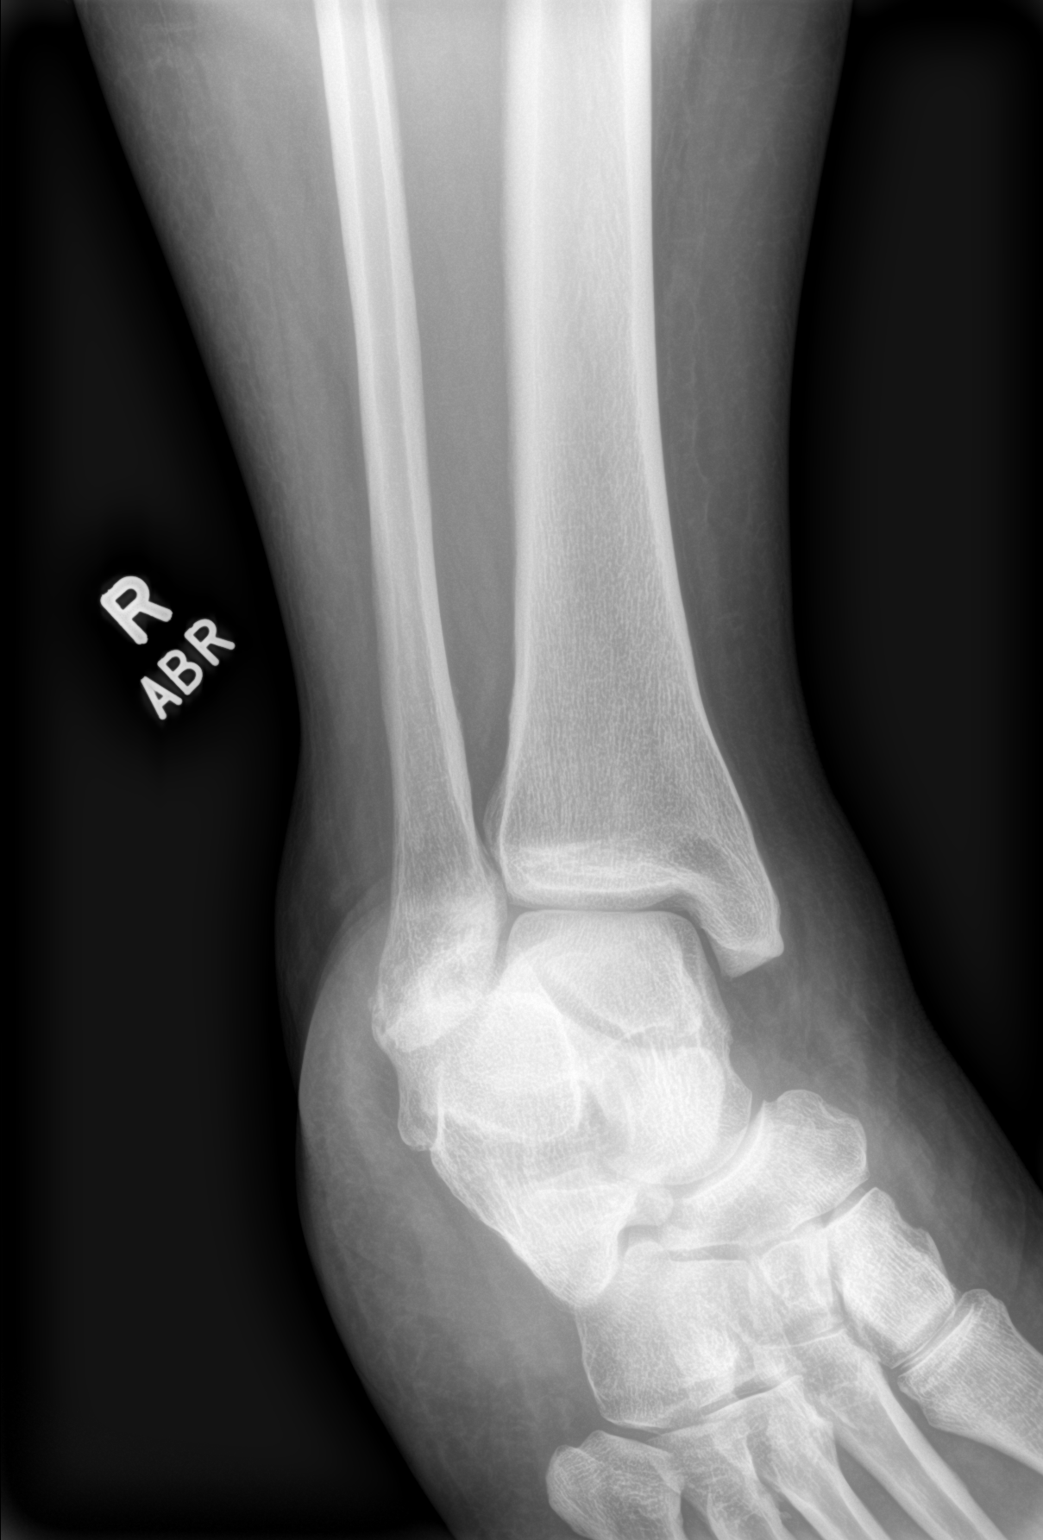

[ankle lat]
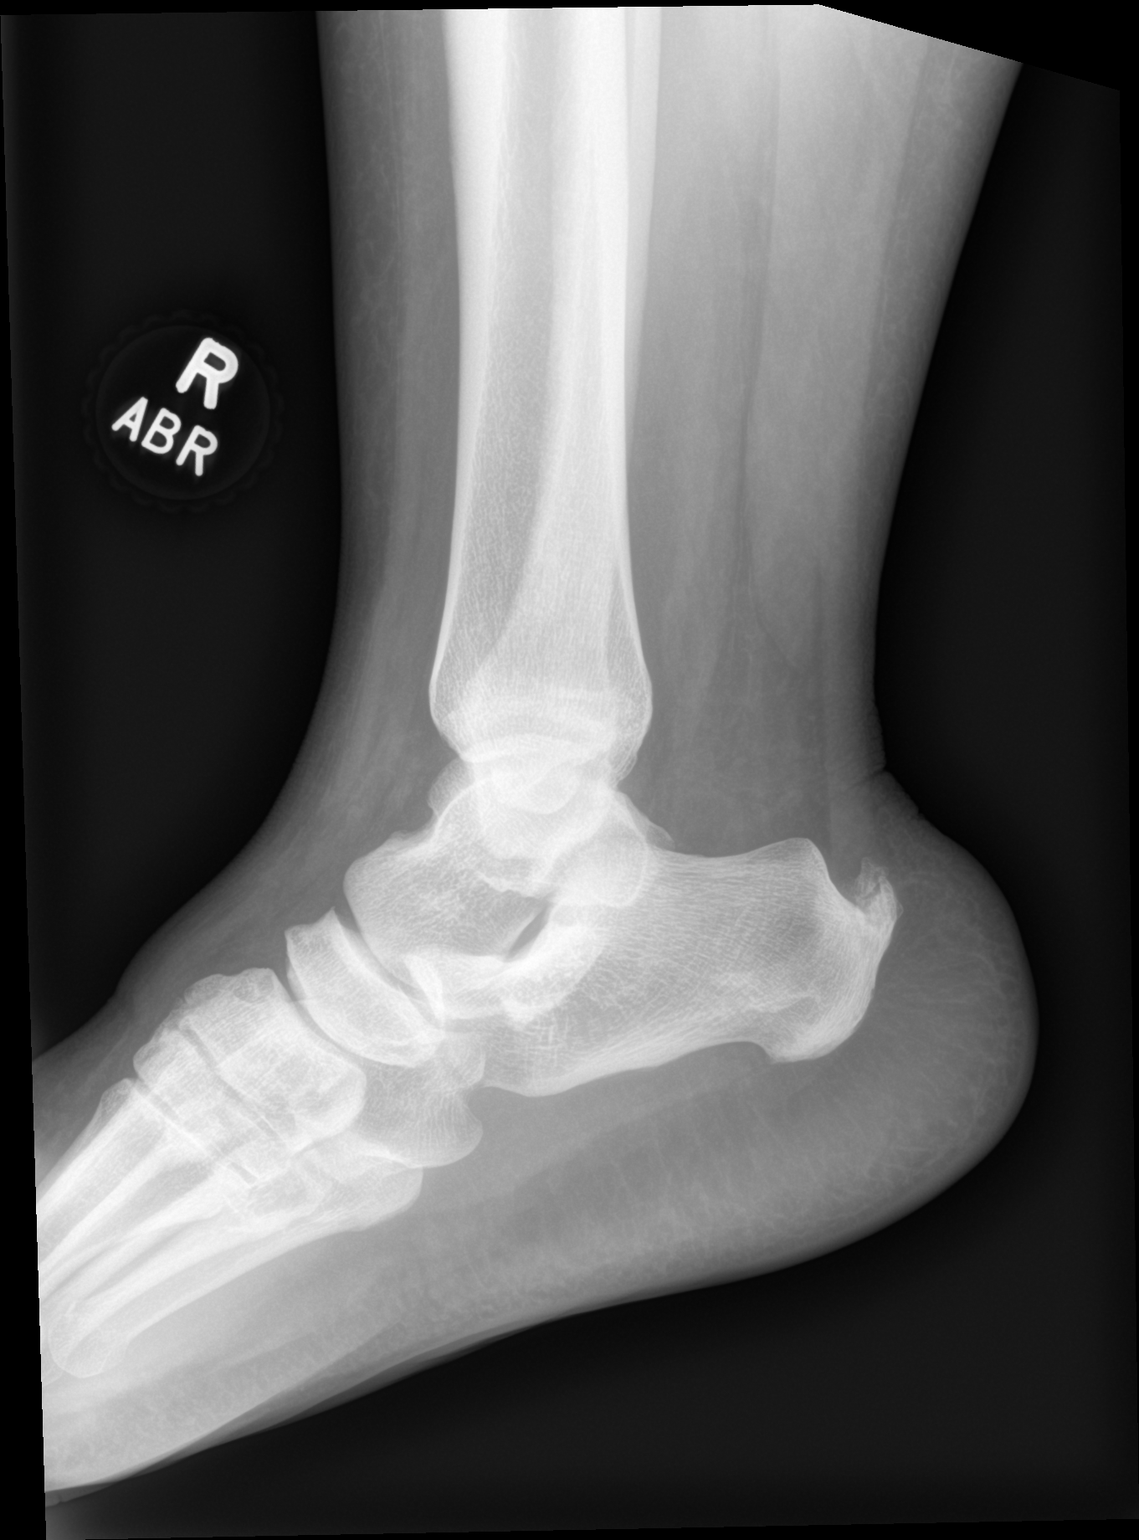

[3 of 3 positions shown; findings below may reference images not displayed]

FINDINGS: There is no evidence of fracture, dislocation, or joint effusion.
The ankle mortise is preserved. Normal hindfoot alignment. There is
a moderate Achilles tendon enthesophyte. Mild generalized soft
tissue edema
IMPRESSION: 1. Soft tissue edema without acute fracture or subluxation.
2. Moderate Achilles tendon enthesophyte.

## 2023-03-06 IMAGING — DX DG WRIST COMPLETE 3+V*R*
4 series · 4 of 4 positions shown · non-contrast
Comparison: None.

CLINICAL DATA: Right wrist pain and swelling after fall.

EXAM:
RIGHT WRIST - COMPLETE 3+ VIEW

[wrist ap]
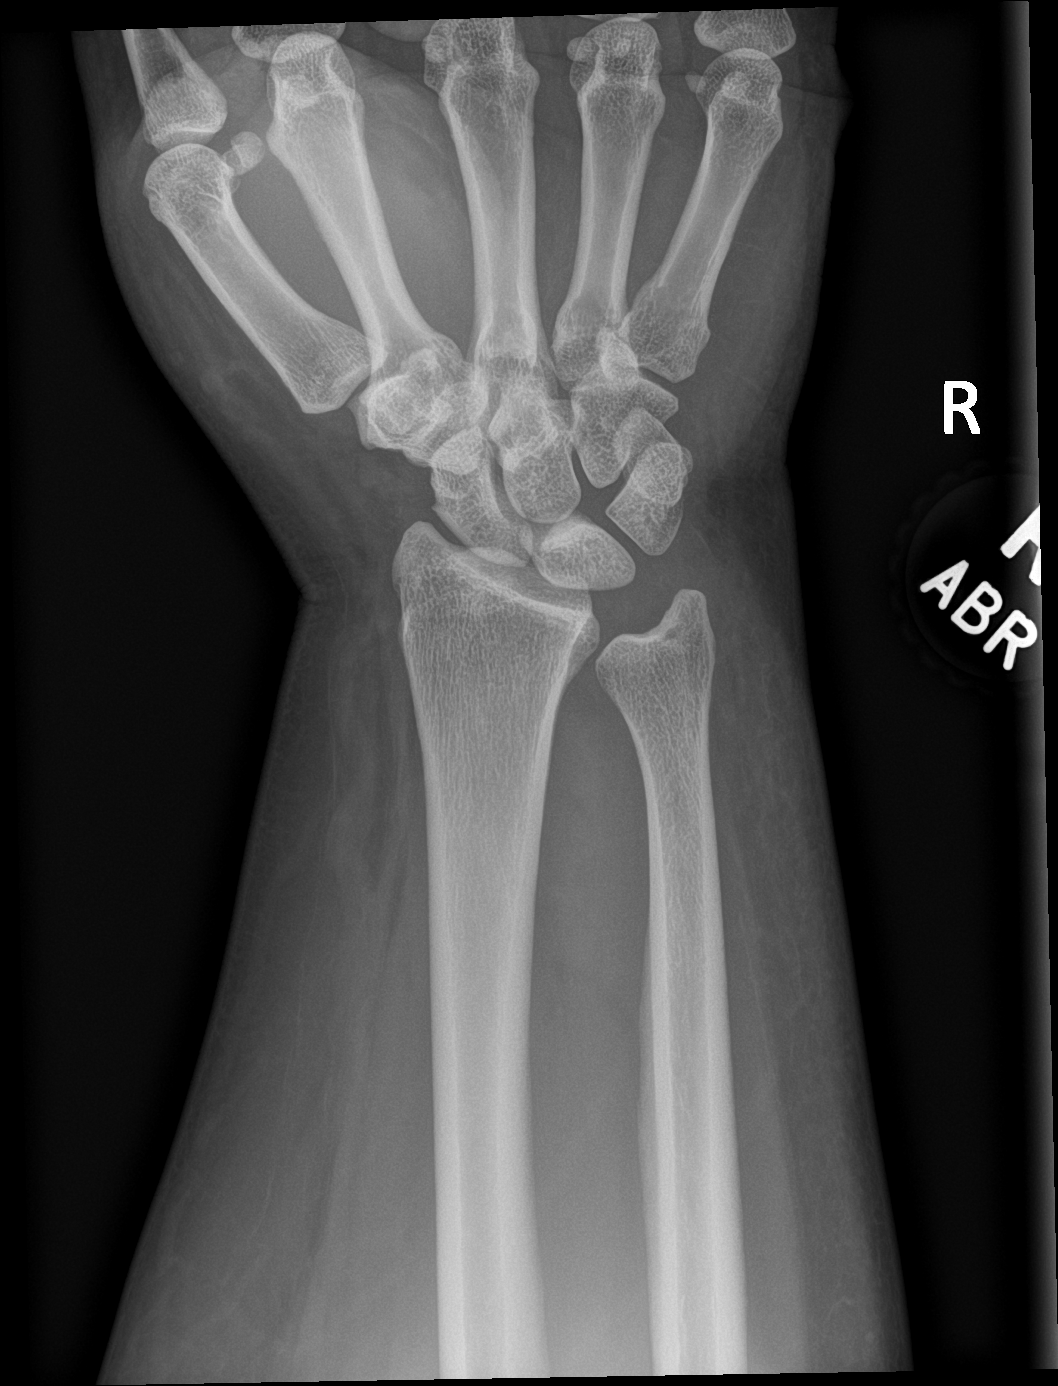

[wrist obl]
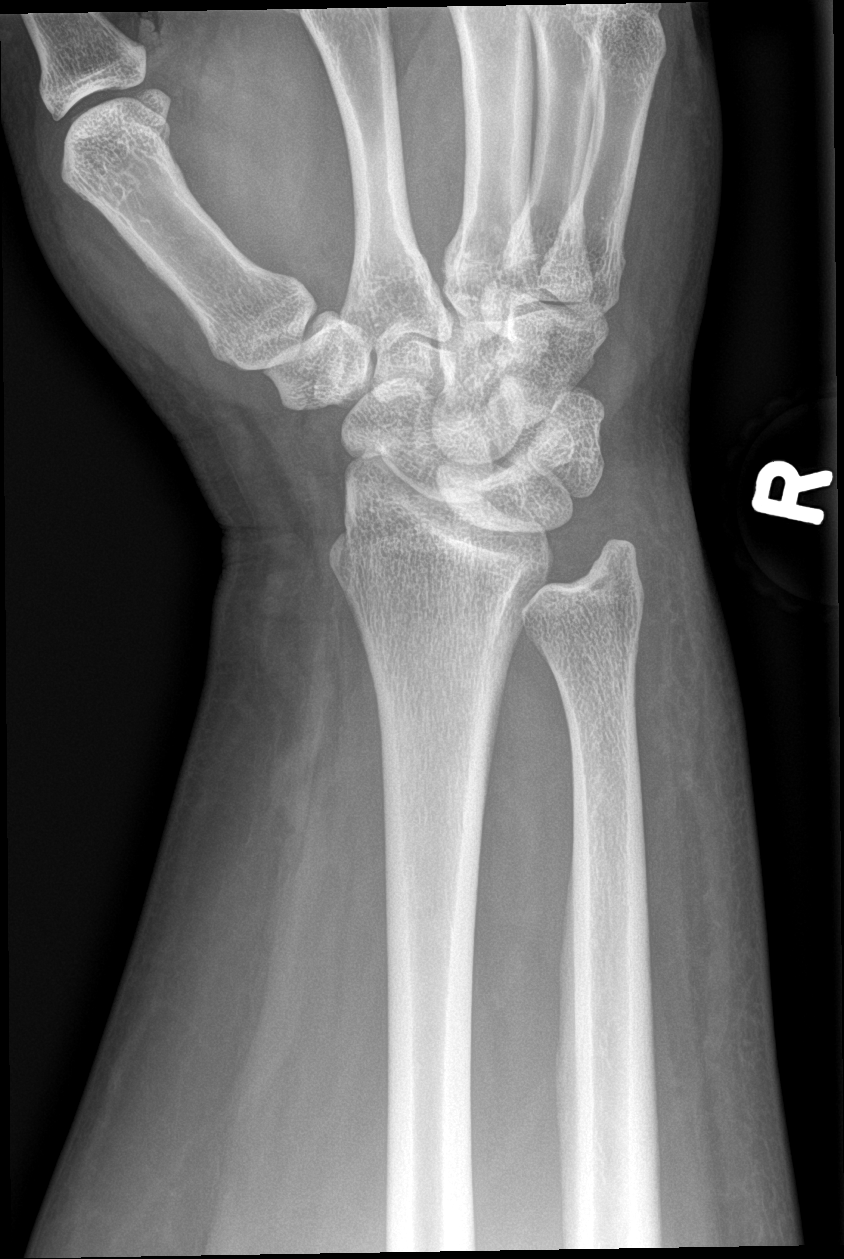

[wrist lat]
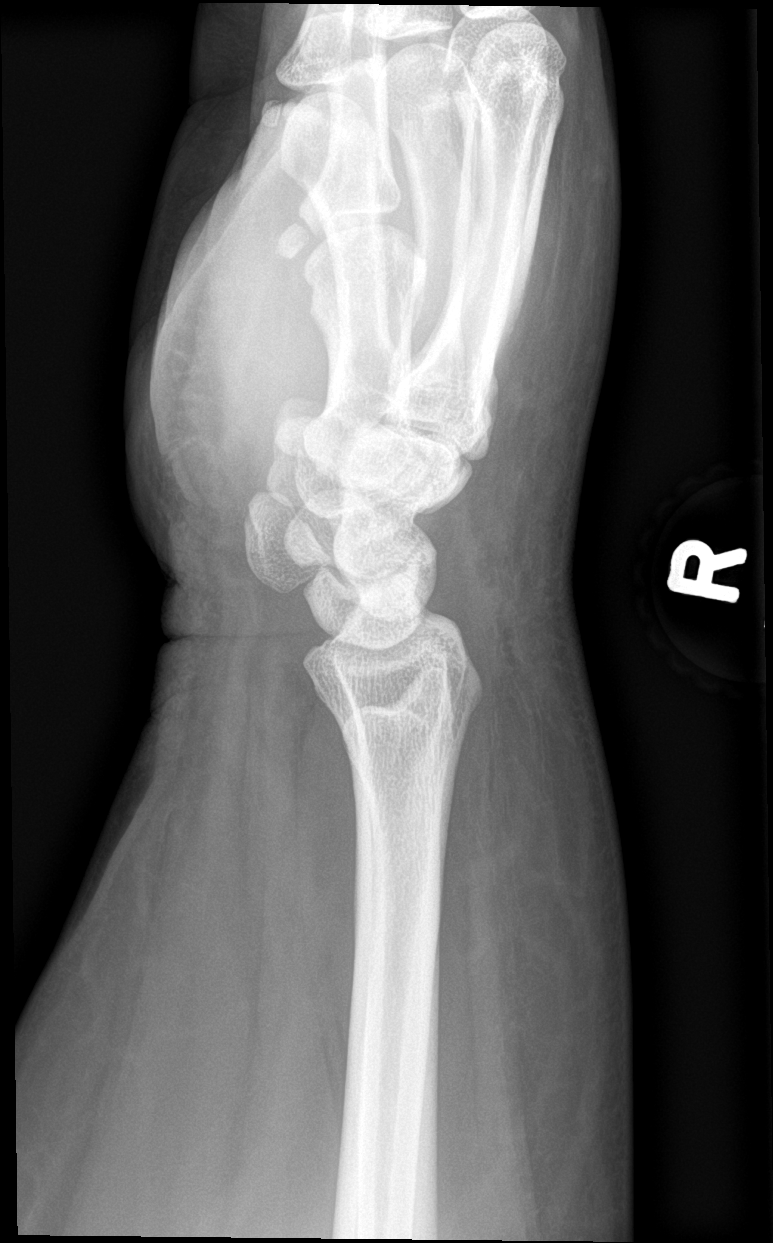

[wrist navicular]
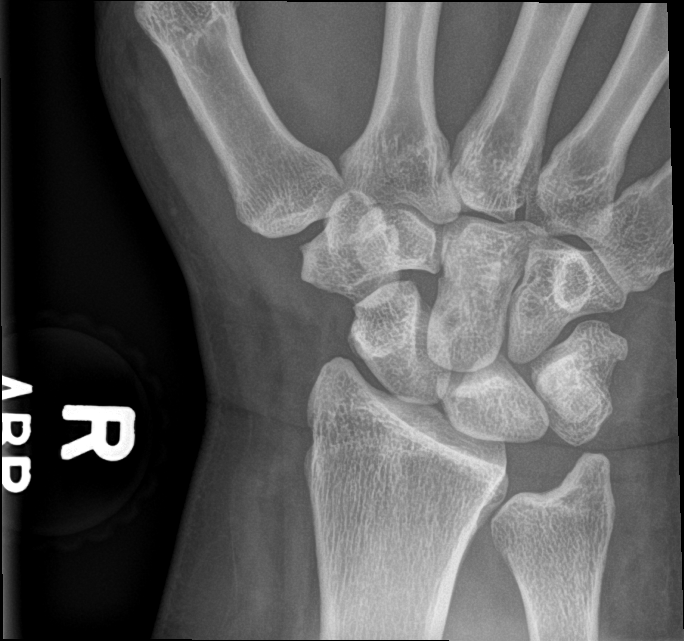

[4 of 4 positions shown; findings below may reference images not displayed]

FINDINGS: There is no evidence of fracture or dislocation. Minimal ulna minus
variance. There is no evidence of arthropathy or other focal bone
abnormality. Generalized soft tissue edema versus habitus.
IMPRESSION: No fracture or subluxation of the right wrist.

## 2023-03-20 ENCOUNTER — Encounter (INDEPENDENT_AMBULATORY_CARE_PROVIDER_SITE_OTHER): Payer: Self-pay

## 2023-04-08 ENCOUNTER — Encounter (INDEPENDENT_AMBULATORY_CARE_PROVIDER_SITE_OTHER): Payer: Self-pay

## 2023-04-16 ENCOUNTER — Encounter (INDEPENDENT_AMBULATORY_CARE_PROVIDER_SITE_OTHER): Payer: Self-pay

## 2023-06-18 NOTE — ED Provider Notes (Signed)
 [facility]  ED Provider Note  Diane Patel 43 y.o. female DOB: 1980-02-24 MRN: [id number] History   Chief Complaint  Patient presents with  . Headache (New onset or new symptoms)    All over head and has been going on for 2 weeks intermittently. She states she has been seeing floaters.  . Chest Pain    Central chest tightness that's been going on for 2 weeks intermittently along with some shob sometimes. Also has some jaw pain.  . Abdominal Pain    Central abd pain along with dark green stools and nausea without vomiting    Patient presents to the emergency room due to multiple issues states that she has headache over the last 2 weeks on an intermittent basis has not had any also intermittent chest pressure abdominal discomfort nausea no vomiting some diarrhea  Past medical history prediabetes hypertension PCOS GERD  Allergies see list  Smoke none alcohol none  Operations gallbladder appendix Nabel cyst tonsillectomy       Past Medical History:  Diagnosis Date  . Allergy    . Anemia   . Asthma (*)   . GERD (gastroesophageal reflux disease)   . Hypertension    no medications  . Inflammatory bowel disease   . Iron (Fe) deficiency anemia   . Palpitations    went to cardiologist for  . PCOS (polycystic ovarian syndrome)   . Pre-diabetes     Past Surgical History:  Procedure Laterality Date  . Appendectomy    . Cholecystectomy    . Colonoscopy  04/06/2021  . Tonsillectomy and adenoidectomy    . Upper gastrointestinal endoscopy     unknown    Social History   Substance and Sexual Activity  Alcohol Use Never   Social History   Tobacco Use  Smoking Status Never  . Passive exposure: Never  Smokeless Tobacco Never   E-Cigarettes  . Vaping Use Never User   . Start Date    . Cartridges/Day    . Quit Date     Social History   Substance and Sexual Activity  Drug Use Never         Allergies  Allergen Reactions  . Ace  Inhibitors Anaphylaxis  . Chocolate Itching    Has been diagnosed by allergist   . Cocoa Itching    Has been diagnosed by allergist   . Naproxen Hives and Itching  . Pineapple Hives and Anaphylaxis  . Cyclobenzaprine  Hives  . Lactose Diarrhea  . Other Hives    Vicodin   . Prednisone  Hives    Home Medications   ALBUTEROL  SULFATE HFA (PROVENTIL ,VENTOLIN ,PROAIR ) 108 (90 BASE) MCG/ACT INHALER    albuterol  sulfate HFA 90 mcg/actuation aerosol inhaler   CETIRIZINE (ZYRTEC) 10 MG TABLET    TAKE 1 TABLET BY MOUTH DAILY   FLUOCINOLONE ACETONIDE BODY 0.01 % OIL       IRON-VIT C-VIT B12-FOLIC ACID 100-250-0.025-1 MG TABS    Take 1 tablet by mouth daily.   KETOCONAZOLE (NIZORAL) 2% SHAMPOO    SMARTSIG:Topical Every Other Day   PANTOPRAZOLE SODIUM (PROTONIX) 40 MG TABLET    TAKE ONE TABLET BY MOUTH DAILY.   SILVER SULFADIAZINE (SILVADENE,SSD) 1% CREAM    Apply topically daily.   SPIRONOLACTONE (ALDACTONE) 50 MG TABLET    Take one tablet (50 mg dose) by mouth daily.   TRIAMCINOLONE (KENALOG) 0.1% OINTMENT    triamcinolone acetonide 0.1 % topical ointment   ZINC GLUCONATE 50 MG TABLET  Take one tablet (50 mg dose) by mouth daily.    Primary Survey  Primary Survey  Review of Systems   Review of Systems  Constitutional:  Negative for chills and fever.  Respiratory:  Negative for choking and shortness of breath.   Cardiovascular:  Positive for chest pain.  Gastrointestinal:  Positive for abdominal pain and nausea.  Neurological:  Positive for dizziness and headaches.    Physical Exam   ED Triage Vitals [06/18/23 2304]  BP (!) 141/100  Heart Rate 95  Resp 18  SpO2 95 %  Temp 98.5 F (36.9 C)    Physical Exam  Vitals reviewed. Constitutional: She appears well-developed. She does not appear distressed.  HENT:  Head: Normocephalic.  Eyes: EOM are intact. Pupils are equal, round, and reactive to light.  Neck: Normal range of motion. Neck supple.  Cardiovascular: Normal rate and  regular rhythm.  Pulmonary/Chest: Respiratory effort normal and breath sounds normal.  Abdominal: Soft. There is mild generalized abdominal tenderness.  Musculoskeletal: Normal range of motion.     Cervical back: Normal range of motion and neck supple.   Neurological: She is alert.  Skin: Skin is warm.  Psychiatric: She has a normal mood and affect.     ED Course   Lab results:   GEN5 CARDIAC TROPONIN T (TNT5) BASELINE - Abnormal      Result Value   TnT-Gen5 (0hr) 15 (*)    Comment: An elevated Troponin indicates myocardial damage. Elevated troponin may also be due to pulmonary emboli, aortic dissection, heart failure, trauma, toxins and ischemia in the setting of critical illness.   CBC AND DIFFERENTIAL - Abnormal   WBC 9.1     RBC 4.89     HGB 12.9     HCT 40.3     MCV 82.4     MCH 26.4     MCHC 32.0 (*)    Plt Ct 348     RDW SD 40.1     MPV 9.4     NRBC% 0.0     Absolute NRBC Count 0.00     NEUTROPHIL % 67.8     LYMPHOCYTE % 22.3     MONOCYTE % 7.6     Eosinophil % 1.4     BASOPHIL % 0.7     IG% 0.2     ABSOLUTE NEUTROPHIL COUNT 6.19     ABSOLUTE LYMPHOCYTE COUNT 2.04     Absolute Monocyte Count 0.69     Absolute Eosinophil Count 0.13     Absolute Basophil Count 0.06     Absolute Immature Granulocyte Count 0.02    COMPREHENSIVE METABOLIC PANEL - Abnormal   Na 138     Potassium 4.3     Cl 104     CO2 25     AGAP 9     Glucose 101 (*)    BUN 12     Creatinine 0.88     Ca 9.8     ALK PHOS 77     T Bili 0.4     Total Protein 7.7     Alb 4.1     GLOBULIN 3.6     ALBUMIN/GLOBULIN RATIO 1.1     BUN/CREAT RATIO 13.6     ALT 10     AST 17     eGFR 83     Comment: Normal GFR (glomerular filtration rate) > 60 mL/min/1.73 meters squared, < 60 may include impaired kidney function. Calculation based on the Chronic Kidney  Disease Epidemiology Collaboration (CK-EPI)equation refit without adjustment for race.  URINALYSIS W/MICRO REFLEX CULTURE - SYMPTOMATIC -  Abnormal   Urine Color Colorless (*)    Urine Clarity Clear     Urine Specific Gravity 1.009     Urine pH 6.0     Urine Protein - Dipstick Negative     Urine Glucose Negative     Urine Ketones Negative     Urine Bilirubin Negative     Urine Blood Negative     Urine Nitrite Negative     Urine Urobilinogen <2     Urine Leukocyte Esterase Negative     UA Microscopic No Micro     Narrative:    Does not meet criteria for reflex to Urine Culture.  MAGNESIUM - Normal   Mg 2.3    HUMAN CHORIONIC GONADOTROPIN  (HCG), BETA-SUBUNIT, QUALITATIVE - Normal   Beta HCG Qual Negative    LIPASE - Normal   Lipase 30    LACTIC ACID - Normal   Lactic Acid 0.9    GEN5 CARDIAC TROPONIN T(TNT5) 1 HOUR - Normal   TnT-Gen5 (1hr) 5     Comment: Assay results < 6 ng/L and > 10,000 ng/L are reported as 5 ng/L and 10,001 ng/L respectively to reflect the absolute reportable range of TnTGen5.  An elevated Troponin indicates myocardial damage. Elevated troponin may also be due to pulmonary emboli, aortic dissection, heart failure, trauma, toxins and ischemia in the setting of critical illness.   Delta 1 Hour -10     Narrative:    Troponin not required to be called due to prior elevated result notified by the laboratory.   GEN5 CARDIAC TROPONIN T(TNT5) 3 HOUR  LIGHT BLUE TOP  GOLD SST    Imaging:   XR CHEST AP PORTABLE   Narrative:    EXAM: XR CHEST AP PORTABLE  INDICATION: Chest Pain  COMPARISON: 10/16/2018  FINDINGS:  There is no cardiomegaly or pulmonary vascular congestion. There is no acute infiltrate. Lungs are clear. There is no pleural effusion or pneumothorax seen.     Impression:    IMPRESSION:  No acute abnormality.   Electronically Signed by: Sonny Livings, MD on 06/18/2023 11:52 PM  CT ANGIO PULMONARY   Narrative:    COMPARISON: 06/18/2023   INDICATION: Chest Pain Abdominal Pain   TECHNIQUE:  CT ABDOMEN PELVIS W IV CONTRAST, CT ANGIO PULMONARY   100 mL  IOPAMIDOL  76 % IV  SOLN Dose reduction was utilized (automated exposure control, mA or kV adjustment based on patient size, or iterative image reconstruction). Image post processing was then performed creating multi planar 2D and angiographic 3D MIP, shaded surface rendering or 3D volume rendering images for review and comparison.  FINDINGS:  CT CHEST  Lower Neck: Unremarkable  Lungs: No acute pulmonary infiltrate identified.  Cardiovascular: Unremarkable  Pulmonary Arteries: No pulmonary embolus identified. Satisfactory opacification of pulmonary arteries.  Thoracic Aorta: No dissection.  No aneurysm.  Mediastinum: Unremarkable  Lymph Nodes: Unremarkable  Pleura: No pleural effusions.  No pneumothorax.  Chest Walls: Unremarkable  Bones: Unremarkable.  CT ABDOMEN PELVIS  Liver: Mild hepatomegaly.  Gallbladder and Biliary Tree: Cholecystectomy.  Pancreas: Unremarkable  Spleen: Unremarkable  Adrenal Glands: Unremarkable  Kidneys and Urinary Bladder: Unremarkable  Reproductive System: Unremarkable  Vasculature: Unremarkable  Lymph Nodes: Unremarkable  Peritoneum and Retroperitoneum: No free fluid. No free air.  No abscess.  Gastrointestinal Tract: No bowel obstruction.  No evidence for acute appendicitis.  Abdominal and Pelvic Walls: Unremarkable  Bones: Unremarkable.    Impression:    IMPRESSION: CT CHEST 1.  No acute pulmonary embolus or thoracic aortic dissection identified. 2.  No acute pulmonary infiltrate or pleural effusion identified.  CT ABDOMEN PELVIS 1.  No acute process identified. 2.  Chronic nonemergent findings.    Electronically Signed by: Charlie Patch, MD on 06/19/2023 1:36 AM  CT ABDOMEN PELVIS W IV CONTRAST   Narrative:    COMPARISON: 06/18/2023   INDICATION: Chest Pain Abdominal Pain   TECHNIQUE:  CT ABDOMEN PELVIS W IV CONTRAST, CT ANGIO PULMONARY   100 mL  IOPAMIDOL  76 % IV SOLN Dose reduction was utilized (automated exposure control, mA or  kV adjustment based on patient size, or iterative image reconstruction). Image post processing was then performed creating multi planar 2D and angiographic 3D MIP, shaded surface rendering or 3D volume rendering images for review and comparison.  FINDINGS:  CT CHEST  Lower Neck: Unremarkable  Lungs: No acute pulmonary infiltrate identified.  Cardiovascular: Unremarkable  Pulmonary Arteries: No pulmonary embolus identified. Satisfactory opacification of pulmonary arteries.  Thoracic Aorta: No dissection.  No aneurysm.  Mediastinum: Unremarkable  Lymph Nodes: Unremarkable  Pleura: No pleural effusions.  No pneumothorax.  Chest Walls: Unremarkable  Bones: Unremarkable.  CT ABDOMEN PELVIS  Liver: Mild hepatomegaly.  Gallbladder and Biliary Tree: Cholecystectomy.  Pancreas: Unremarkable  Spleen: Unremarkable  Adrenal Glands: Unremarkable  Kidneys and Urinary Bladder: Unremarkable  Reproductive System: Unremarkable  Vasculature: Unremarkable  Lymph Nodes: Unremarkable  Peritoneum and Retroperitoneum: No free fluid. No free air.  No abscess.  Gastrointestinal Tract: No bowel obstruction.  No evidence for acute appendicitis.  Abdominal and Pelvic Walls: Unremarkable  Bones: Unremarkable.    Impression:    IMPRESSION: CT CHEST 1.  No acute pulmonary embolus or thoracic aortic dissection identified. 2.  No acute pulmonary infiltrate or pleural effusion identified.  CT ABDOMEN PELVIS 1.  No acute process identified. 2.  Chronic nonemergent findings.    Electronically Signed by: Charlie Patch, MD on 06/19/2023 1:36 AM  CT HEAD WO CONTRAST   Narrative:    INDICATION:Headache   TECHNIQUE:  CT HEAD WO CONTRAST  FINDINGS:   CT HEAD No acute intracranial hemorrhage identified. No acute midline shift or acute mass effect.  No abnormal extra-axial fluid collections are seen.   Ventricles, cisterns, and sulci are unremarkable. Visualized orbits are  unremarkable.   Visualized paranasal sinuses are clear.   Mastoid air cells are clear. No displaced or depressed calvarial fracture identified.    Impression:    IMPRESSION:  CT HEAD No acute intracranial abnormality identified.   Electronically Signed by: Charlie Patch, MD on 06/19/2023 1:01 AM      ECG: ECG Results          ECG 12 lead (In process)  Result time 06/19/23 01:27:56    In process             Narrative:   Diagnosis Class Abnormal Acquisition Device D3K Systolic BP 137 Diastolic BP 70 Ventricular Rate 75 Atrial Rate 75 P-R Interval 164 QRS Duration 76 Q-T Interval 404 QTC Calculation(Bazett) 451 Calculated P Axis 42 Calculated R Axis 23 Calculated T Axis 25  Diagnosis Normal sinus rhythm Low voltage QRS Cannot rule out Anterior infarct , age undetermined Abnormal ECG When compared with ECG of 18-Jun-2023 23:49, No significant change was found                         ECG  12 lead (In process)  Result time 06/18/23 23:55:53    In process             Narrative:   Diagnosis Class Borderline Abnormal Acquisition Device D3K Systolic BP 172 Diastolic BP 81 Ventricular Rate 81 Atrial Rate 81 P-R Interval 158 QRS Duration 80 Q-T Interval 386 QTC Calculation(Bazett) 448 Calculated P Axis 5 Calculated R Axis 32 Calculated T Axis 27  Diagnosis Normal sinus rhythm Low voltage QRS Borderline ECG When compared with ECG of 16-Oct-2018 20:04, No significant change was found                            HEAR Score History: Mostly low risk features ECG: Normal Age: Less than 45 yrs Risk Factors: 1 or 2 risk factors HEAR Score Total: 1                                                        Pre-Sedation Procedures    Medical Decision Making Patient presents with multiple issues going on headache for the last couple weeks some chest discomfort intermittently abdominal pain with  nausea Physical examination nonrevealing  Laboratory studies unremarkable CT scan of the head negative CT scan of the chest abdomen pelvis negative  Have asked her to follow-up with her primary care physician for further evaluation  Amount and/or Complexity of Data Reviewed Labs: ordered. Radiology: ordered. ECG/medicine tests: ordered.  Risk Prescription drug management.           Provider Communication  New Prescriptions   No medications on file    Modified Medications   No medications on file    Discontinued Medications   No medications on file    Clinical Impression Final diagnoses:  Other headache syndrome  Abdominal pain, unspecified abdominal location    ED Disposition     ED Disposition  Discharge   Condition  Stable   Comment  --                   Electronically signed by:    Lynwood JAYSON Life, MD 06/19/23 [phone]

## 2023-08-25 ENCOUNTER — Encounter (HOSPITAL_COMMUNITY): Payer: Self-pay | Admitting: Emergency Medicine

## 2023-08-25 ENCOUNTER — Other Ambulatory Visit: Payer: Self-pay

## 2023-08-25 ENCOUNTER — Emergency Department (HOSPITAL_COMMUNITY)

## 2023-08-25 ENCOUNTER — Emergency Department (HOSPITAL_COMMUNITY)
Admission: EM | Admit: 2023-08-25 | Discharge: 2023-08-25 | Disposition: A | Discharge status: Home / Self Care | Attending: Emergency Medicine | Admitting: Emergency Medicine

## 2023-08-25 DIAGNOSIS — R079 Chest pain, unspecified: Secondary | ICD-10-CM | POA: Diagnosis not present

## 2023-08-25 DIAGNOSIS — R110 Nausea: Secondary | ICD-10-CM | POA: Diagnosis not present

## 2023-08-25 DIAGNOSIS — R519 Headache, unspecified: Secondary | ICD-10-CM | POA: Insufficient documentation

## 2023-08-25 LAB — CBC
HCT: 43.1 % (ref 36.0–46.0)
Hemoglobin: 13.5 g/dL (ref 12.0–15.0)
MCH: 26.3 pg (ref 26.0–34.0)
MCHC: 31.3 g/dL (ref 30.0–36.0)
MCV: 83.9 fL (ref 80.0–100.0)
Platelets: 370 K/uL (ref 150–400)
RBC: 5.14 MIL/uL — ABNORMAL HIGH (ref 3.87–5.11)
RDW: 13.4 % (ref 11.5–15.5)
WBC: 8 K/uL (ref 4.0–10.5)
nRBC: 0 % (ref 0.0–0.2)

## 2023-08-25 LAB — BASIC METABOLIC PANEL WITH GFR
Anion gap: 9 (ref 5–15)
BUN: 13 mg/dL (ref 6–20)
CO2: 25 mmol/L (ref 22–32)
Calcium: 9.5 mg/dL (ref 8.9–10.3)
Chloride: 106 mmol/L (ref 98–111)
Creatinine, Ser: 0.75 mg/dL (ref 0.44–1.00)
GFR, Estimated: >60 mL/min (ref >60)
Glucose, Bld: 91 mg/dL (ref 70–99)
Potassium: 4 mmol/L (ref 3.5–5.1)
Sodium: 140 mmol/L (ref 135–145)

## 2023-08-25 MED ORDER — FENTANYL CITRATE PF 50 MCG/ML IJ SOSY
12.5000 ug | PREFILLED_SYRINGE | Freq: Once | INTRAMUSCULAR | Status: AC
  Administered 2023-08-25: 12.5 ug via INTRAVENOUS
  Filled 2023-08-25: qty 1

## 2023-08-25 MED ORDER — DEXAMETHASONE SODIUM PHOSPHATE 10 MG/ML IJ SOLN
10.0000 mg | Freq: Once | INTRAMUSCULAR | Status: AC
  Administered 2023-08-25: 10 mg via INTRAVENOUS
  Filled 2023-08-25: qty 1

## 2023-08-25 MED ORDER — PROCHLORPERAZINE EDISYLATE 10 MG/2ML IJ SOLN
10.0000 mg | INTRAMUSCULAR | Status: AC
  Administered 2023-08-25: 10 mg via INTRAVENOUS
  Filled 2023-08-25: qty 2

## 2023-08-25 NOTE — Discharge Instructions (Signed)
 You were seen today for a headache.  Your head CT did not show any evidence of acute bleeding or other problems.  There was some sphenoid sinus disease noted.  Please follow-up with your doctor for this. Return if you are having any new or worsening symptoms. Please keep your appointment with your cardiologist.

## 2023-08-25 NOTE — ED Provider Notes (Signed)
 [place] EMERGENCY DEPARTMENT AT [facility] Provider Note   CSN: [id number] Arrival date & time: 08/25/23  1120     Patient presents with: Headache and Nausea   Diane Patel is a 43 y.o. female.   HPI 43 year old female presents today complaining of headache.  She states that she began having headache this morning around 7.  Increased to the morning.  She has had some nausea associated with this.  The pain is located left side of her head.  It worsened during the morning.  She felt lightheaded and dizzy.  She comes to the ED secondary to this headache.  She reports that she had similar symptoms a month ago and was seen at [facility].  At that time, she reports that she also had some chest pain.  She is scheduled for follow-up with cardiology.  She is not currently having any chest pain or dyspnea.    Prior to Admission medications   Medication Sig Start Date End Date Taking? Authorizing Provider  acetaminophen  (TYLENOL ) 500 MG tablet Take 1 tablet (500 mg total) by mouth every 6 (six) hours as needed. Patient taking differently: Take 500-1,000 mg by mouth every 6 (six) hours as needed for mild pain or headache. 04/13/19   Meryle Ip A, PA-C  albuterol  (PROVENTIL  HFA;VENTOLIN  HFA) 108 (90 Base) MCG/ACT inhaler Inhale 1-2 puffs into the lungs every 6 (six) hours as needed for wheezing or shortness of breath. 04/18/17   Khatri, Hina, PA-C  azithromycin  (ZITHROMAX ) 250 MG tablet Take 1 tablet (250 mg total) by mouth daily. 08/13/22   Geroldine Berg, MD  diclofenac  Sodium (VOLTAREN ) 1 % GEL Apply 4 g topically 4 (four) times daily as needed. 08/11/20   Hilts, Ozell, MD  dicyclomine  (BENTYL ) 20 MG tablet Take 1 tablet (20 mg total) by mouth 2 (two) times daily. 09/21/21   Roselyn Carlin NOVAK, MD  ferrous sulfate 324 MG TBEC Take 1 tablet by mouth daily. 10/05/20   [provider]  furosemide  (LASIX ) 20 MG tablet TAKE 1 TABLET (20 MG TOTAL) BY MOUTH DAILY AS NEEDED. FOR MORE THAN  USUAL LEG/HAND SWELLING 07/03/21 10/01/21  Patwardhan, Newman PARAS, MD  labetalol  (NORMODYNE ) 200 MG tablet Take 1 tablet (200 mg total) by mouth 2 (two) times daily. 10/30/20   Patwardhan, Newman PARAS, MD  loperamide  (IMODIUM ) 2 MG capsule Take 1 capsule (2 mg total) by mouth 4 (four) times daily as needed for diarrhea or loose stools. 09/21/21   Roselyn Carlin NOVAK, MD  metFORMIN (GLUCOPHAGE) 500 MG tablet Take 500 mg by mouth daily. 08/01/20   [provider]  ondansetron  (ZOFRAN ) 4 MG tablet Take 1 tablet (4 mg total) by mouth every 6 (six) hours. 01/06/20   Tegeler, Lonni PARAS, MD  ondansetron  (ZOFRAN -ODT) 8 MG disintegrating tablet Take 1 tablet (8 mg total) by mouth every 8 (eight) hours as needed for nausea or vomiting. 12/18/21   Emelia Sluder, PA-C  pantoprazole (PROTONIX) 40 MG tablet Take 40 mg by mouth daily.    [provider]  predniSONE  (DELTASONE ) 10 MG tablet Take 2 tablets (20 mg total) by mouth 2 (two) times daily with a meal. 08/13/22   Geroldine Berg, MD  vitamin E 1000 UNIT capsule Take 1,000 Units by mouth daily.    [provider]    Allergies: Ace inhibitors, Pineapple, Flexeril  [cyclobenzaprine ], Lactose intolerance (gi), Other, Chocolate, Cocoa, and Naproxen    Review of Systems  Updated Vital Signs BP (!) 152/118   Pulse 78  Temp 98.7 F (37.1 C) (Oral)   Resp 18   SpO2 100%   Physical Exam Vitals reviewed.  Constitutional:      Appearance: She is well-developed.  HENT:     Head: Normocephalic.     Mouth/Throat:     Mouth: Mucous membranes are moist.  Eyes:     Extraocular Movements: Extraocular movements intact.  Cardiovascular:     Rate and Rhythm: Normal rate and regular rhythm.     Heart sounds: Normal heart sounds.  Pulmonary:     Effort: Pulmonary effort is normal.     Breath sounds: Normal breath sounds.  Abdominal:     Palpations: Abdomen is soft.  Musculoskeletal:        General: Normal range of motion.     Cervical back:  Normal range of motion and neck supple.  Skin:    General: Skin is warm and dry.     Capillary Refill: Capillary refill takes less than 2 seconds.  Neurological:     Mental Status: She is alert.  Psychiatric:        Mood and Affect: Mood normal.        Behavior: Behavior normal.     (all labs ordered are listed, but only abnormal results are displayed) Labs Reviewed  CBC - Abnormal; Notable for the following components:      Result Value   RBC 5.14 (*)    All other components within normal limits  BASIC METABOLIC PANEL WITH GFR    EKG: None  Radiology: CT Head Wo Contrast Result Date: 08/25/2023 CLINICAL DATA:  Headache, sudden severe. Left-sided headache and nausea starting this morning. EXAM: CT HEAD WITHOUT CONTRAST TECHNIQUE: Contiguous axial images were obtained from the base of the skull through the vertex without intravenous contrast. RADIATION DOSE REDUCTION: This exam was performed according to the departmental dose-optimization program which includes automated exposure control, adjustment of the mA and/or kV according to patient size and/or use of iterative reconstruction technique. COMPARISON:  CT head 01/12/2015. FINDINGS: Brain: No acute intracranial hemorrhage. No CT evidence of acute infarct. No edema, mass effect, or midline shift. The basilar cisterns are patent. Ventricles: The ventricles are normal. Vascular: No hyperdense vessel or unexpected calcification. Skull: No acute or aggressive finding. Orbits: Orbits are symmetric. Sinuses: Complete opacification of the left sphenoid sinus. Mucosal thickening in the ethmoid sinuses. Other: Mastoid air cells are clear. IMPRESSION: No CT evidence of acute intracranial abnormality. Left sphenoid sinus disease. Electronically Signed   By: Donnice Mania M.D.   On: 08/25/2023 13:12     Procedures   Medications Ordered in the ED  dexamethasone  (DECADRON ) injection 10 mg (10 mg Intravenous Given 08/25/23 1226)  fentaNYL   (SUBLIMAZE ) injection 12.5 mcg (12.5 mcg Intravenous Given 08/25/23 1225)  prochlorperazine  (COMPAZINE ) injection 10 mg (10 mg Intravenous Given 08/25/23 1226)    Clinical Course as of 08/25/23 1353  Mon Aug 25, 2023  1332 CT head reviewed interpreted no evidence of acute intracranial abnormality there is left sphenoid sinus disease noted. [DR]  1351 Metabolic panel reviewed interpreted and normal [DR]    Clinical Course User Index [DR] Levander Houston, MD                                 Medical Decision Making Amount and/or Complexity of Data Reviewed Labs: ordered. Radiology: ordered.  Risk Prescription drug management.  43 year old female presents today complaining of headache.  Patient was recently seen for similar symptoms at [facility].  Here she has no acute neurological deficits Differential diagnosis includes but is not limited to intracranial hemorrhage, intracranial mass, migraine headaches, cluster headache, other causes of, and headaches. Patient is evaluated here with CT imaging and has no evidence of acute intracranial abnormality.  She received medications including Compazine , Solu-Medrol, and 12.5 mg of fentanyl  with resolution of her headache. 1:37 PM Patient reevaluated.  She states her headache is almost gone and a 1 out of 10. Patient's blood pressure was elevated here.  She did not take her blood pressure medication today.  She is aware of need for outpatient follow-up.    Final diagnoses:  Acute nonintractable headache, unspecified headache type    ED Discharge Orders     None          Levander Houston, MD 08/25/23 1410

## 2023-08-25 NOTE — ED Triage Notes (Addendum)
 Patient presents due to a headache and nausea. Pain is located on the left side of her head. Symptoms began this morning. Patient took the equivalent of 650 mg of tylenol  which help for a few moments. Patient reported having some dizziness at one point, but has resolved. Denies head trauma.
